# Patient Record
Sex: Female | Born: 1945 | Race: White | Hispanic: No | Smoking: Former smoker
Health system: Southern US, Community
[De-identification: ages and names within clinical notes are randomized; demographics above are authoritative.]

## PROBLEM LIST (undated history)

## (undated) DIAGNOSIS — M545 Low back pain, unspecified: Secondary | ICD-10-CM

## (undated) DIAGNOSIS — E7800 Pure hypercholesterolemia, unspecified: Secondary | ICD-10-CM

## (undated) DIAGNOSIS — F419 Anxiety disorder, unspecified: Secondary | ICD-10-CM

## (undated) DIAGNOSIS — I10 Essential (primary) hypertension: Secondary | ICD-10-CM

## (undated) DIAGNOSIS — J45909 Unspecified asthma, uncomplicated: Secondary | ICD-10-CM

## (undated) HISTORY — PX: CHOLECYSTECTOMY: SHX55

## (undated) HISTORY — DX: Pure hypercholesterolemia, unspecified: E78.00

## (undated) HISTORY — DX: Low back pain: M54.5

## (undated) HISTORY — DX: Essential (primary) hypertension: I10

## (undated) HISTORY — DX: Anxiety disorder, unspecified: F41.9

## (undated) HISTORY — DX: Low back pain, unspecified: M54.50

## (undated) HISTORY — DX: Unspecified asthma, uncomplicated: J45.909

## (undated) HISTORY — PX: OTHER SURGICAL HISTORY: SHX169

---

## 1998-03-20 ENCOUNTER — Ambulatory Visit (HOSPITAL_COMMUNITY): Admission: RE | Admit: 1998-03-20 | Discharge: 1998-03-20 | Payer: Self-pay | Admitting: Orthopedic Surgery

## 1998-03-23 ENCOUNTER — Ambulatory Visit (HOSPITAL_COMMUNITY): Admission: RE | Admit: 1998-03-23 | Discharge: 1998-03-23 | Payer: Self-pay | Admitting: Orthopedic Surgery

## 1998-05-01 ENCOUNTER — Ambulatory Visit (HOSPITAL_COMMUNITY): Admission: RE | Admit: 1998-05-01 | Discharge: 1998-05-01 | Payer: Self-pay | Admitting: Obstetrics and Gynecology

## 1998-07-13 ENCOUNTER — Other Ambulatory Visit: Admission: RE | Admit: 1998-07-13 | Discharge: 1998-07-13 | Payer: Self-pay | Admitting: *Deleted

## 1998-12-24 ENCOUNTER — Other Ambulatory Visit: Admission: RE | Admit: 1998-12-24 | Discharge: 1998-12-24 | Payer: Self-pay | Admitting: Obstetrics and Gynecology

## 1999-05-21 ENCOUNTER — Ambulatory Visit (HOSPITAL_COMMUNITY): Admission: RE | Admit: 1999-05-21 | Discharge: 1999-05-21 | Payer: Self-pay | Admitting: Urology

## 1999-05-21 ENCOUNTER — Encounter: Payer: Self-pay | Admitting: Urology

## 1999-07-10 ENCOUNTER — Other Ambulatory Visit: Admission: RE | Admit: 1999-07-10 | Discharge: 1999-07-10 | Payer: Self-pay | Admitting: Obstetrics and Gynecology

## 1999-07-11 ENCOUNTER — Other Ambulatory Visit: Admission: RE | Admit: 1999-07-11 | Discharge: 1999-07-11 | Payer: Self-pay | Admitting: *Deleted

## 1999-07-11 ENCOUNTER — Encounter (INDEPENDENT_AMBULATORY_CARE_PROVIDER_SITE_OTHER): Payer: Self-pay

## 2000-05-14 ENCOUNTER — Other Ambulatory Visit: Admission: RE | Admit: 2000-05-14 | Discharge: 2000-05-14 | Payer: Self-pay | Admitting: Obstetrics and Gynecology

## 2000-10-01 ENCOUNTER — Emergency Department (HOSPITAL_COMMUNITY): Admission: EM | Admit: 2000-10-01 | Discharge: 2000-10-01 | Payer: Self-pay | Admitting: Emergency Medicine

## 2001-05-04 ENCOUNTER — Other Ambulatory Visit: Admission: RE | Admit: 2001-05-04 | Discharge: 2001-05-04 | Payer: Self-pay | Admitting: Obstetrics and Gynecology

## 2001-05-12 ENCOUNTER — Encounter: Payer: Self-pay | Admitting: Orthopedic Surgery

## 2001-05-12 ENCOUNTER — Ambulatory Visit (HOSPITAL_COMMUNITY): Admission: RE | Admit: 2001-05-12 | Discharge: 2001-05-12 | Payer: Self-pay | Admitting: Orthopedic Surgery

## 2001-10-08 ENCOUNTER — Encounter: Payer: Self-pay | Admitting: General Surgery

## 2001-10-08 ENCOUNTER — Ambulatory Visit (HOSPITAL_COMMUNITY): Admission: RE | Admit: 2001-10-08 | Discharge: 2001-10-08 | Payer: Self-pay | Admitting: General Surgery

## 2002-04-13 ENCOUNTER — Ambulatory Visit (HOSPITAL_COMMUNITY): Admission: RE | Admit: 2002-04-13 | Discharge: 2002-04-13 | Payer: Self-pay | Admitting: Orthopedic Surgery

## 2002-04-13 ENCOUNTER — Encounter: Payer: Self-pay | Admitting: Orthopedic Surgery

## 2002-08-11 ENCOUNTER — Other Ambulatory Visit: Admission: RE | Admit: 2002-08-11 | Discharge: 2002-08-11 | Payer: Self-pay | Admitting: Obstetrics and Gynecology

## 2003-03-02 ENCOUNTER — Other Ambulatory Visit: Admission: RE | Admit: 2003-03-02 | Discharge: 2003-03-02 | Payer: Self-pay | Admitting: Obstetrics and Gynecology

## 2003-04-23 ENCOUNTER — Emergency Department (HOSPITAL_COMMUNITY): Admission: EM | Admit: 2003-04-23 | Discharge: 2003-04-23 | Payer: Self-pay | Admitting: Emergency Medicine

## 2003-04-23 ENCOUNTER — Encounter: Payer: Self-pay | Admitting: Emergency Medicine

## 2003-12-27 ENCOUNTER — Emergency Department (HOSPITAL_COMMUNITY): Admission: EM | Admit: 2003-12-27 | Discharge: 2003-12-27 | Payer: Self-pay | Admitting: Emergency Medicine

## 2004-12-02 ENCOUNTER — Ambulatory Visit: Payer: Self-pay | Admitting: Internal Medicine

## 2004-12-23 ENCOUNTER — Emergency Department (HOSPITAL_COMMUNITY): Admission: EM | Admit: 2004-12-23 | Discharge: 2004-12-23 | Payer: Self-pay | Admitting: Emergency Medicine

## 2005-08-06 ENCOUNTER — Ambulatory Visit: Payer: Self-pay | Admitting: Pulmonary Disease

## 2006-06-03 ENCOUNTER — Ambulatory Visit: Payer: Self-pay | Admitting: Critical Care Medicine

## 2006-06-11 ENCOUNTER — Ambulatory Visit: Payer: Self-pay | Admitting: Pulmonary Disease

## 2006-09-08 ENCOUNTER — Ambulatory Visit: Payer: Self-pay | Admitting: Pulmonary Disease

## 2006-11-17 ENCOUNTER — Ambulatory Visit: Payer: Self-pay | Admitting: Pulmonary Disease

## 2006-12-11 ENCOUNTER — Encounter: Payer: Self-pay | Admitting: Pulmonary Disease

## 2006-12-11 LAB — CONVERTED CEMR LAB
AST: 34 units/L (ref 0–37)
Bilirubin, Direct: 0.1 mg/dL (ref 0.0–0.3)
CO2: 23 meq/L (ref 19–32)
Calcium: 9 mg/dL (ref 8.4–10.5)
Eosinophils Relative: 4 % (ref 0–5)
Glucose, Bld: 42 mg/dL — ABNORMAL LOW (ref 70–99)
HCT: 44.7 % (ref 36.0–46.0)
Hemoglobin: 14.7 g/dL (ref 12.0–15.0)
LDL Cholesterol: 155 mg/dL — ABNORMAL HIGH (ref 0–99)
Lymphocytes Relative: 21 % (ref 12–46)
Lymphs Abs: 1.1 10*3/uL (ref 0.7–3.3)
Platelets: 256 10*3/uL (ref 150–400)
Sodium: 141 meq/L (ref 135–145)
TSH: 2.824 microintl units/mL (ref 0.350–5.50)
Total Bilirubin: 0.6 mg/dL (ref 0.3–1.2)
Total CHOL/HDL Ratio: 4
VLDL: 25 mg/dL (ref 0–40)
WBC: 5.6 10*3/uL (ref 4.0–10.5)

## 2006-12-16 ENCOUNTER — Ambulatory Visit: Payer: Self-pay | Admitting: Pulmonary Disease

## 2008-02-08 DIAGNOSIS — I10 Essential (primary) hypertension: Secondary | ICD-10-CM | POA: Insufficient documentation

## 2008-02-08 DIAGNOSIS — F411 Generalized anxiety disorder: Secondary | ICD-10-CM | POA: Insufficient documentation

## 2008-03-28 ENCOUNTER — Ambulatory Visit: Payer: Self-pay | Admitting: Pulmonary Disease

## 2008-03-28 DIAGNOSIS — M545 Low back pain, unspecified: Secondary | ICD-10-CM | POA: Insufficient documentation

## 2008-03-28 DIAGNOSIS — Z91199 Patient's noncompliance with other medical treatment and regimen due to unspecified reason: Secondary | ICD-10-CM | POA: Insufficient documentation

## 2008-03-28 DIAGNOSIS — E7800 Pure hypercholesterolemia, unspecified: Secondary | ICD-10-CM | POA: Insufficient documentation

## 2008-03-28 DIAGNOSIS — Z9119 Patient's noncompliance with other medical treatment and regimen: Secondary | ICD-10-CM

## 2008-04-05 LAB — CONVERTED CEMR LAB
ALT: 30 units/L (ref 0–35)
AST: 24 units/L (ref 0–37)
Basophils Absolute: 0 10*3/uL (ref 0.0–0.1)
Bilirubin, Direct: 0.2 mg/dL (ref 0.0–0.3)
CO2: 30 meq/L (ref 19–32)
Calcium: 9.5 mg/dL (ref 8.4–10.5)
Chloride: 98 meq/L (ref 96–112)
Cholesterol: 252 mg/dL (ref 0–200)
GFR calc non Af Amer: 109 mL/min
HDL: 57 mg/dL (ref >39.0)
Lymphocytes Relative: 18.8 % (ref 12.0–46.0)
MCHC: 34.7 g/dL (ref 30.0–36.0)
Neutrophils Relative %: 69.4 % (ref 43.0–77.0)
RDW: 13.8 % (ref 11.5–14.6)
Sodium: 135 meq/L (ref 135–145)
TSH: 2.65 microintl units/mL (ref 0.35–5.50)
Total Bilirubin: 0.9 mg/dL (ref 0.3–1.2)
Triglycerides: 96 mg/dL (ref 0–149)
VLDL: 19 mg/dL (ref 0–40)

## 2008-09-25 ENCOUNTER — Ambulatory Visit: Payer: Self-pay | Admitting: Pulmonary Disease

## 2008-10-01 LAB — CONVERTED CEMR LAB
Albumin: 4 g/dL (ref 3.5–5.2)
Alkaline Phosphatase: 95 units/L (ref 39–117)
BUN: 8 mg/dL (ref 6–23)
Calcium: 9.4 mg/dL (ref 8.4–10.5)
Direct LDL: 150.2 mg/dL
GFR calc Af Amer: 162 mL/min
GFR calc non Af Amer: 134 mL/min
HDL: 50.4 mg/dL (ref >39.0)
Potassium: 3.7 meq/L (ref 3.5–5.1)
Total Protein: 7 g/dL (ref 6.0–8.3)
Triglycerides: 72 mg/dL (ref 0–149)
VLDL: 14 mg/dL (ref 0–40)

## 2008-10-05 ENCOUNTER — Ambulatory Visit: Payer: Self-pay | Admitting: Internal Medicine

## 2008-10-18 ENCOUNTER — Ambulatory Visit: Payer: Self-pay | Admitting: Internal Medicine

## 2009-10-10 ENCOUNTER — Telehealth: Payer: Self-pay | Admitting: Pulmonary Disease

## 2009-12-10 ENCOUNTER — Telehealth (INDEPENDENT_AMBULATORY_CARE_PROVIDER_SITE_OTHER): Payer: Self-pay | Admitting: *Deleted

## 2009-12-26 ENCOUNTER — Ambulatory Visit: Payer: Self-pay | Admitting: Pulmonary Disease

## 2009-12-30 LAB — CONVERTED CEMR LAB
AST: 28 units/L (ref 0–37)
Alkaline Phosphatase: 101 units/L (ref 39–117)
Basophils Absolute: 0.1 10*3/uL (ref 0.0–0.1)
Bilirubin, Direct: 0.2 mg/dL (ref 0.0–0.3)
Calcium: 9.6 mg/dL (ref 8.4–10.5)
Cholesterol: 234 mg/dL — ABNORMAL HIGH (ref 0–200)
GFR calc non Af Amer: 107.79 mL/min (ref >60)
Glucose, Bld: 86 mg/dL (ref 70–99)
HCT: 39.8 % (ref 36.0–46.0)
Hemoglobin, Urine: NEGATIVE
Hemoglobin: 13.8 g/dL (ref 12.0–15.0)
Lymphs Abs: 1.2 10*3/uL (ref 0.7–4.0)
MCHC: 34.7 g/dL (ref 30.0–36.0)
MCV: 89.2 fL (ref 78.0–100.0)
Monocytes Absolute: 0.6 10*3/uL (ref 0.1–1.0)
Monocytes Relative: 8.6 % (ref 3.0–12.0)
Neutro Abs: 4.8 10*3/uL (ref 1.4–7.7)
Nitrite: NEGATIVE
Platelets: 264 10*3/uL (ref 150.0–400.0)
Potassium: 4 meq/L (ref 3.5–5.1)
RDW: 14.2 % (ref 11.5–14.6)
Sodium: 131 meq/L — ABNORMAL LOW (ref 135–145)
Specific Gravity, Urine: 1.015 (ref 1.000–1.030)
Total Bilirubin: 1 mg/dL (ref 0.3–1.2)
Total CHOL/HDL Ratio: 3
Total Protein, Urine: NEGATIVE mg/dL
Urine Glucose: NEGATIVE mg/dL
Urobilinogen, UA: 0.2 (ref 0.0–1.0)
VLDL: 14.8 mg/dL (ref 0.0–40.0)
Vit D, 25-Hydroxy: 26 ng/mL — ABNORMAL LOW (ref 30–89)

## 2010-01-10 ENCOUNTER — Telehealth: Payer: Self-pay | Admitting: Pulmonary Disease

## 2010-03-21 ENCOUNTER — Encounter: Payer: Self-pay | Admitting: Pulmonary Disease

## 2010-09-24 NOTE — Progress Notes (Signed)
Summary: PHYSICAL/ LABS  Phone Note Call from Patient   Caller: Patient Call For: NADEL Summary of Call: PT WANTS TO SCHEDUL A PHYSICAL. HOWEVER, SHE NEEDS AN APPT "LATE IN THE DAY" AS SHE CAN'T GET OFF UNTIL 4:30. 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 [phone] Initial call taken by: Tivis Ringer, CNA,  December 10, 2009 9:21 AM  Follow-up for Phone Call        Pt states it is time for yearly cpx with SN.  Requesting an appt for 3pm or after.  Also would like to know if she can have labs done the morning of appt at [facility] Short Stay ( she works there ).  If so, would like these orders faxed to [phone].  Will forward to SN - pls advise.  Thanks!  Follow-up by: Gweneth Dimitri RN,  December 10, 2009 9:32 AM  Additional Follow-up for Phone Call Additional follow up Details #1::        per SN---please make appt for pt on 5-4 at 3:30 pm.  thanks Randell Loop CMA  December 10, 2009 11:00 AM    she can do that when she comes in for her labs---we can give her the rx for the labs to have done at work.  thanks Randell Loop CMA  December 10, 2009 2:17 PM     Additional Follow-up for Phone Call Additional follow up Details #2::    Did SN want to order the labs? Please advise thanks Vernie Murders  December 10, 2009 12:51 PM  Spoke with pt and sched appt with SN for CPX for 12/26/09 at 3:30 pm.  Pt aware we can just order the labs at that time. Follow-up by: Vernie Murders,  December 10, 2009 2:24 PM

## 2010-09-24 NOTE — Progress Notes (Signed)
Summary: Change Micardis HCT to Losartan HCT  Phone Note Outgoing Call   Call placed by: Michel Bickers CMA,  October 10, 2009 11:20 AM Call placed to: Patient Summary of Call: [facility] stating that the patient would have an annual savings of $100 is she switched from Micardis-HCT to Losartan HCT. The patient states she is fine with making the switch if SN okays this. Please advise. Initial call taken by: Michel Bickers CMA,  October 10, 2009 11:22 AM  Follow-up for Phone Call        per SN---ok to change to losartan/hct  100/12.5mg    #30  1 by mouth once daily refill x 1   she is due for yearly follow up now and will need to schedule this. Randell Loop [facility]  October 10, 2009 4:18 PM   rx sent. pt home number is disconnected. ATC pt at work number but she had already left for the day. will retry tomorrow. Carron Curie CMA  October 10, 2009 4:32 PM  pt advised rx sent. pt did not want to give another home number at this time. Carron Curie CMA  October 11, 2009 2:24 PM     New/Updated Medications: LOSARTAN POTASSIUM-HCTZ 100-12.5 MG TABS (LOSARTAN POTASSIUM-HCTZ) Take 1 tablet by mouth once a day Prescriptions: LOSARTAN POTASSIUM-HCTZ 100-12.5 MG TABS (LOSARTAN POTASSIUM-HCTZ) Take 1 tablet by mouth once a day  #30 x 1   Entered by:   Carron Curie CMA   Authorized by:   Michele Mcalpine MD   Signed by:   Carron Curie CMA on 10/10/2009   Method used:   [facility]ronically [facility]        [facility]  [phone]* (retail)       [address] [place], [place]  [address]       Ph: [phone] or [phone]       Fax: [phone]   RxID:   [id number]

## 2010-09-24 NOTE — Letter (Signed)
Summary: [facility]   Imported By: Sherian Rein 04/08/2010 14:15:05  _____________________________________________________________________  External Attachment:    Type:   Image     Comment:   External Document

## 2010-09-24 NOTE — Progress Notes (Signed)
Summary: rx  Phone Note Call from Patient Call back at Home Phone [phone]   Caller: Patient Call For: Lisa Milian Reason for Call: Talk to Nurse Summary of Call: pt lost her rx's - can you [facility] Losartan Potassium, Proair HFA, Advair Diskus 250/50 Initial call taken by: Eugene Gavia,  Jan 10, 2010 8:08 AM  Follow-up for Phone Call        rx sent. ATC pt but number disconnected. Carron Curie CMA  Jan 10, 2010 9:27 AM     Prescriptions: LOSARTAN POTASSIUM-HCTZ 100-12.5 MG TABS (LOSARTAN POTASSIUM-HCTZ) Take 1 tablet by mouth once a day  #30 x prn   Entered by:   Carron Curie CMA   Authorized by:   Michele Mcalpine MD   Signed by:   Carron Curie CMA on 01/10/2010   Method used:   [facility]ically to        [facility]  [phone]* (retail)       [address] [place], [place]  [address]       Ph: [phone] or [phone]       Fax: [phone]   RxID:   [id number] PROAIR HFA 108 (90 BASE) MCG/ACT  AERS (ALBUTEROL SULFATE) 1-2 sprays every 6H as needed for wheezing...  #1 x prn   Entered by:   Carron Curie CMA   Authorized by:   Michele Mcalpine MD   Signed by:   Carron Curie CMA on 01/10/2010   Method used:   [facility]ically to        [facility]  [phone]* (retail)       [address] [place], [place]  [address]       Ph: [phone] or [phone]       Fax: [phone]   RxID:   [id number] ADVAIR DISKUS 250-50 MCG/DOSE  MISC (FLUTICASONE-SALMETEROL) 1 inhalation twice daily...  #1 x prn   Entered by:   Carron Curie CMA   Authorized by:   Michele Mcalpine MD   Signed by:   Carron Curie CMA on 01/10/2010   Method used:   [facility]        [facility]  [phone]* (retail)       [address] [place], [place]  [address]       Ph: [phone] or [phone]       Fax: [phone]   RxID:   [id number]

## 2010-09-24 NOTE — Assessment & Plan Note (Signed)
Summary: cpx//lmr   CC:  15 month ROV & CPX....  History of Present Illness: 65 y/o WF here for a follow up visit & review of her mult med problems...   ~  September 25, 2008:  when last seen Aug09 her FLP was even worse w/ TChol 252, & LDL 180... she agreed to start Simva40 and has been regular w/ her dosing & tol well... here today for f/u FLP... she also has Asthma & has been good about taking her Advair250Bid although she still uses her Proair fairly frequently (several times per week)... NOTE: SHE TELLS ME THAT SHE IS WILLING TO SCHEDULE A COLONOSCOPY!!!   ~  Dec 26, 2009:  she states she had a good yr & feels great... no problems w/ her Asthma at all, she says... she had her colonoscopy by DrDBrodie 2/10 & it was neg- f/u planned 35yrs... she has not been taking her Simva40 ("I forget it half the time") & she's been "doing it on my own w/ really good diet" despite 4# weight gain... she agrees to try Crestor in the AM w/ her other meds if she needs statin therapy... she notes that BP has been great at work- all 130-140/ 70-80 she says;  but todays BP is elevated w/ sl HA & she is quite sure this is a one time aberration since ALL her prev readings were so normal at work... we discussed no salt, get weight down, take med every day, & check BP's often at home, at work, etc (she will call if BP> 150/90).   Current Problem List:  Hx of ASTHMA, MODERATE (ICD-493.90) - she is an ex-smoker, quit 2002... hx mod asthma w/ occas exac & mult triggers... ? of VCD in the past... she has been taking ADVAIR 250Bid and reports no asthma attacks in several yrs, and she uses the [facility] rescue inhaler as needed... she had trip to [place] and went to [place] in the summer of 2009 without too much difficulty... denies cough, sputum, hemoptysis, worsening dyspnea, wheezing, chest pains, snoring, daytime hypersomnolence, etc...   ~  CXR 5/11 showed chr changes/ COPD, NAD...  HYPERTENSION (ICD-401.9) - hx mod  HBP that appears to be well controlled on LOSARTAN/ HCT 100-12.5 daily... BP's at work all 130-140's/ 70-80's and takes med regularly & tolerates well... BP today= 160/100+ and it was elevated at work too, but she is quite sure this is a one time aberration related to ???... she has sl HA & fatigue; but denies visual changes, CP, palipit, dizziness, syncope, dyspnea, edema, etc...   ~  EKG 5/11 showed NSR, WNL.Marland Kitchen.  ~  5/11:  we discussed the importance of no salt, get weight down, take med every day, & check BP's often at home, at work, etc (she will call if BP> 150/90).  HYPERCHOLESTEROLEMIA (ICD-272.0) - she tried  to control Chol w/ oatmeal, cheerios, and fish oil... finally agreed to start SIMVASTATIN 40mg /d in Aug09, but couldn't remember to take it half the time since it was at night... she has agreed to try CRESTOR Qam>   ~  FLP 4/08 showed TChol 241, TG 126, HDL 61, LDL 155... rec to start Simvastatin, but chose diet Rx.  ~  FLP 8/09 showed TChol 252, TG 96, HDL 57, LDL 180... she agreed to start Simva40.  ~  FLP 1/10 showed TChol 213, TG 72, HDL 50, LDL 150... rec to take med more regularly.  ~  FLP 5/11 showed TChol  234, TG 74, HDL 68, LDL 156... admits to not taking Simva40Qpm, ch to CRESTOR10Qam.  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 eval from DrGioffre and Alusio in the past...  ANXIETY (ICD-300.00)   Allergies: 1)  ! Codeine  Comments:  Nurse/Medical Assistant: The patient's medications and allergies were reviewed with the patient and were updated in the Medication and Allergy Lists.  Past History:  Past Medical History: Hx of ASTHMA, MODERATE (ICD-493.90) HYPERTENSION (ICD-401.9) HYPERCHOLESTEROLEMIA (ICD-272.0) Hx of BACK PAIN, LUMBAR (ICD-724.2) ANXIETY (ICD-300.00)  Past Surgical History: S/P T & A S/P cholecystectomy  Family History: Reviewed history from 03/28/2008 and no changes required. mother deceased age and reason unknown father  deceased from heart attack--age unknown 1 sibling age 110  Social History: Reviewed history from 09/25/2008 and no changes required. widowed 1 child quit smoking in 2004 no exercise drinks caffeine--2 cups per day etoh--social  Review of Systems  The patient denies fever, chills, sweats, anorexia, fatigue, weakness, malaise, weight loss, sleep disorder, blurring, diplopia, [name] irritation, [name] discharge, vision loss, [name] pain, photophobia, earache, ear discharge, tinnitus, decreased hearing, nasal congestion, nosebleeds, sore throat, hoarseness, chest pain, palpitations, syncope, dyspnea on exertion, orthopnea, PND, peripheral edema, cough, dyspnea at rest, excessive sputum, hemoptysis, wheezing, pleurisy, nausea, vomiting, diarrhea, constipation, change in bowel habits, abdominal pain, melena, hematochezia, jaundice, gas/bloating, indigestion/heartburn, dysphagia, odynophagia, dysuria, hematuria, urinary frequency, urinary hesitancy, nocturia, incontinence, back pain, joint pain, joint swelling, muscle cramps, muscle weakness, stiffness, arthritis, sciatica, restless legs, leg pain at night, leg pain with exertion, rash, itching, dryness, suspicious lesions, paralysis, paresthesias, seizures, tremors, vertigo, transient blindness, frequent falls, frequent headaches, difficulty walking, depression, anxiety, memory loss, confusion, cold intolerance, heat intolerance, polydipsia, polyphagia, polyuria, unusual weight change, abnormal bruising, bleeding, enlarged lymph nodes, urticaria, allergic rash, hay fever, and recurrent infections.    Vital Signs:  Patient profile:   65 year old female Height:      62 inches Weight:      162.31 pounds BMI:     29.79 O2 Sat:      96 % on Room air Temp:     97.1 degrees F oral Pulse rate:   97 / minute BP sitting:   160 / 100  (left arm) Cuff size:   regular  Vitals Entered By: Randell Loop CMA (Dec 26, 2009 3:34 PM)  O2 Sat at Rest %:  96 O2 Flow:  Room  air CC: 15 month ROV & CPX... Is Patient Diabetic? No Pain Assessment Patient in pain? no      Comments meds updated today   Physical Exam  Additional Exam:  WD, WN, 65 y/o WF in NAD... GENERAL:  Alert & oriented; pleasant & cooperative... HEENT:  [place]/AT, EOM-wnl, PERRLA, EACs-clear, TMs-wnl, NOSE-clear, THROAT-clear & wnl. NECK:  Supple w/ full ROM; no JVD; normal carotid impulses w/o bruits; no thyromegaly or nodules palpated; no lymphadenopathy. CHEST:  Clear to P & A; without wheezes/ rales/ or rhonchi heard... HEART:  Regular Rhythm; without murmurs/ rubs/ or gallops detected... ABDOMEN:  Soft & nontender; normal bowel sounds; no organomegaly or masses palpated... EXT: without deformities or arthritic changes; no varicose veins/ venous insuffic/ or edema. NEURO:  CN's intact; motor testing normal; sensory testing normal; gait normal & balance OK. DERM:  No lesions noted; no rash etc...     CXR  Procedure date:  12/26/2009  Findings:      CHEST - 2 VIEW Comparison: 06/03/2006   Findings: The cardiac silhouette,  mediastinal and hilar contours are within normal limits and stable.  There are chronic lung changes/COPD but no definite acute overlying pulmonary process. The bony thorax is intact.  No pleural effusion.   IMPRESSION: Chronic lung changes/COPD but no acute overlying pulmonary process.   Read By:  Cyndie Chime,  M.D.   EKG  Procedure date:  12/26/2009  Findings:      Normal sinus rhythm with rate of:  80/ min... Tracing is WNL...  SN   MISC. Report  Procedure date:  12/26/2009  Findings:      BMP (METABOL)   Sodium               [L]  131 mEq/L                   135-145   Potassium                 4.0 mEq/L                   3.5-5.1   Chloride             [L]  94 mEq/L                    96-112   Carbon Dioxide            29 mEq/L                    19-32   Glucose                   86 mg/dL                    16-10   BUN                        9 mg/dL                     9-60   Creatinine                0.6 mg/dL                   4.5-4.0   Calcium                   9.6 mg/dL                   9.8-11.9   GFR                       107.79 mL/min               >60  Hepatic/Liver Function Panel (HEPATIC)   Total Bilirubin           1.0 mg/dL                   1.4-7.8   Direct Bilirubin          0.2 mg/dL                   2.9-5.6   Alkaline Phosphatase      101 U/L                     39-117   AST  28 U/L                      0-37   ALT                       31 U/L                      0-35   Total Protein             7.3 g/dL                    1.6-1.0   Albumin                   4.4 g/dL                    9.6-0.4  CBC Platelet w/Diff (CBCD)   White Cell Count          6.7 K/uL                    4.5-10.5   Red Cell Count            4.47 Mil/uL                 3.87-5.11   Hemoglobin                13.8 g/dL                   54.0-98.1   Hematocrit                39.8 %                      36.0-46.0   MCV                       89.2 fl                     78.0-100.0   Platelet Count            264.0 K/uL                  150.0-400.0   Neutrophil %              71.4 %                      43.0-77.0   Lymphocyte %              17.4 %                      12.0-46.0   Monocyte %                8.6 %                       3.0-12.0   Eosinophils%              1.8 %                       0.0-5.0   Basophils %               0.8 %  0.0-3.0  Comments:      Lipid Panel (LIPID)   Cholesterol          [H]  234 mg/dL                   2-130   Triglycerides             74.0 mg/dL                  8.6-578.4   HDL                       69.62 mg/dL                 >95.28   Cholesterol LDL - Direct (DIRLDL)  Cholesterol LDL - Direct                             155.7 mg/dL           TSH (TSH)   FastTSH                   2.42 uIU/mL                 0.35-5.50  UDip w/Micro (URINE)   Color                      LT. YELLOW   Clarity                   CLEAR                       Clear   Specific Gravity          1.015                       1.000 - 1.030   Urine Ph                  6.5                         5.0-8.0   Protein                   NEGATIVE                    Negative   Urine Glucose             NEGATIVE                    Negative   Ketones                   NEGATIVE                    Negative   Urine Bilirubin           NEGATIVE                    Negative   Blood                     NEGATIVE                    Negative   Urobilinogen              0.2  0.0 - 1.0   Leukocyte Esterace        TRACE                       Negative   Nitrite                   NEGATIVE                    Negative   Urine WBC                 0-2/hpf                     0-2/hpf   Urine Epith               Rare(0-4/hpf)               Rare(0-4/hpf)  Vitamin D (25-Hydroxy) (78295)  Vitamin D (25-Hydroxy)                        [L]  26 ng/mL                    30-89   Impression & Recommendations:  Problem # 1:  PHYSICAL EXAMINATION (ICD-V70.0)  Orders: EKG w/ Interpretation (93000) T-2 View CXR (71020TC) TLB-BMP (Basic Metabolic Panel-BMET) (80048-METABOL) TLB-Hepatic/Liver Function Pnl (80076-HEPATIC) TLB-CBC Platelet - w/Differential (85025-CBCD) TLB-Lipid Panel (80061-LIPID) TLB-TSH (Thyroid Stimulating Hormone) (84443-TSH) TLB-Udip w/ Micro (81001-URINE) T-Vitamin D (25-Hydroxy) (62130-86578)  Problem # 2:  Hx of ASTHMA, MODERATE (ICD-493.90) Stable on the Advair & Prn Proair (only using this <1 per week)... Her updated medication list for this problem includes:    Advair Diskus 250-50 Mcg/dose Misc (Fluticasone-salmeterol) .Marland Kitchen... 1 inhalation twice daily...    Proair Hfa 108 (90 Base) Mcg/act Aers (Albuterol sulfate) .Marland Kitchen... 1-2 sprays every 6h as needed for wheezing...  Problem # 3:  HYPERTENSION (ICD-401.9) BP is up today but she is adamant that it has  been well controlled in the 130-140/ 70-80 range at work over the last yr & she checks it freq= 2-3 times weekly... We decided not to incr meds at this point, but I stressed that it is imperitive that she keep the salt out of her diet, gets weight down, takes med every day, and continues to monitor BP regularly at home & at work... she is to notify us if the BP is sustained >150/90 for additional meds... Her updated medication list for this problem includes:    Losartan Potassium-hctz 100-12.5 Mg Tabs (Losartan potassium-hctz) .Marland Kitchen... Take 1 tablet by mouth once a day  Problem # 4:  HYPERCHOLESTEROLEMIA (ICD-272.0) She was unable to take the Simva40 regularly at night, missing it >50% of the time so she stopped... she tells me that she has been controlling it on her own now w/ diet (despite 4# weight gain)... she wants to f/u FLP_ at this point but did agree to consider Crestor Qam if the labs aren't improved... REC> start CRESTOR 10 Qam. The following medications were removed from the medication list:    Simvastatin 40 Mg Tabs (Simvastatin) .Marland Kitchen... Take one tablet by mouth at bedtime  Problem # 5:  OTHER MEDICAL PROBLEMS AS NOTED>>>  Complete Medication List: 1)  Advair Diskus 250-50 Mcg/dose Misc (Fluticasone-salmeterol) .Marland Kitchen.. 1 inhalation twice daily.Marland KitchenMarland Kitchen 2)  Proair Hfa 108 (90 Base) Mcg/act Aers (Albuterol sulfate) .Marland Kitchen.. 1-2 sprays every 6h as needed for wheezing.Marland KitchenMarland Kitchen 3)  Losartan Potassium-hctz 100-12.5 Mg Tabs (Losartan potassium-hctz) .... Take 1 tablet by mouth once a day  Patient Instructions: 1)  Today we updated your med list- see below.... 2)  We refilled your meds for 2011... 3)  Today we did your follow up CXR, EKG, & FASTING blood work... please call the "phone tree" in a few days for your lab results.Marland KitchenMarland Kitchen 4)  If your Cholesterol is back up we will recommend CRESTOR to take in the AM w/ your other pills (so you won't forget).Marland KitchenMarland Kitchen 5)  Please call the "phone tree" in a few days for your lab  results.Marland KitchenMarland Kitchen  6)  For your BP: please continue to monitor it carefully at work or w/ a digital home BP cuff & call me if the BP is >150/90.Marland KitchenMarland Kitchen 7)  Continue your diet + exercise ptrogram... 8)  Call for any questions.Marland KitchenMarland Kitchen 9)  Please schedule a follow-up appointment in 1 year, sooner as needed. Prescriptions: LOSARTAN POTASSIUM-HCTZ 100-12.5 MG TABS (LOSARTAN POTASSIUM-HCTZ) Take 1 tablet by mouth once a day  #30 x prn   Entered and Authorized by:   Michele Mcalpine MD   Signed by:   Michele Mcalpine MD on 12/26/2009   Method used:   Print then Give to Patient   RxID:   [id number] PROAIR HFA 108 (90 BASE) MCG/ACT  AERS (ALBUTEROL SULFATE) 1-2 sprays every 6H as needed for wheezing...  #1 x prn   Entered and Authorized by:   Michele Mcalpine MD   Signed by:   Michele Mcalpine MD on 12/26/2009   Method used:   Print then Give to Patient   RxID:   [id number] ADVAIR DISKUS 250-50 MCG/DOSE  MISC (FLUTICASONE-SALMETEROL) 1 inhalation twice daily...  #1 x prn   Entered and Authorized by:   Michele Mcalpine MD   Signed by:   Michele Mcalpine MD on 12/26/2009   Method used:   Print then Give to Patient   RxID:   [id number]    [facility] ECG  ID: [id number] Patient: [name], [name] DOB: 1945/12/15 Age: 65 Years Old Sex: Female Race: White Physician: Dr Kriste Basque Technician: Denna Haggard, CMA Height: 62 Weight: 162.31 Status: Unconfirmed Past Medical History:   Hx of ASTHMA, MODERATE (ICD-493.90) HYPERTENSION (ICD-401.9) HYPERCHOLESTEROLEMIA (ICD-272.0) Hx of BACK PAIN, LUMBAR (ICD-724.2) ANXIETY (ICD-300.00) PERS HX NONCOMPLIANCE W/MED TX PRS HAZARDS HLTH (ICD-V15.81)   Recorded: 12/26/2009 3:57 PM P/PR: 87 ms / 118 ms - Heart rate (maximum exercise) QRS: 82 QT/QTc/QTd: 400 ms / 433 ms / 31 ms - Heart rate (maximum exercise)  P/QRS/T axis: 59 deg / 71 deg / 73 deg - Heart rate (maximum exercise)  Heartrate: 79 bpm  Interpretation:  Normal sinus rhythm with rate of:  80/  min... Tracing is WNL.Marland KitchenMarland Kitchen  SN

## 2010-11-24 HISTORY — PX: OTHER SURGICAL HISTORY: SHX169

## 2010-12-11 ENCOUNTER — Other Ambulatory Visit: Payer: Self-pay | Admitting: Orthopedic Surgery

## 2010-12-11 ENCOUNTER — Other Ambulatory Visit (HOSPITAL_COMMUNITY): Payer: 59

## 2010-12-11 ENCOUNTER — Encounter (HOSPITAL_COMMUNITY): Payer: 59 | Attending: Orthopedic Surgery

## 2010-12-11 DIAGNOSIS — J449 Chronic obstructive pulmonary disease, unspecified: Secondary | ICD-10-CM | POA: Insufficient documentation

## 2010-12-11 DIAGNOSIS — I10 Essential (primary) hypertension: Secondary | ICD-10-CM | POA: Insufficient documentation

## 2010-12-11 DIAGNOSIS — J4489 Other specified chronic obstructive pulmonary disease: Secondary | ICD-10-CM | POA: Insufficient documentation

## 2010-12-11 DIAGNOSIS — Z01812 Encounter for preprocedural laboratory examination: Secondary | ICD-10-CM | POA: Insufficient documentation

## 2010-12-11 DIAGNOSIS — M19049 Primary osteoarthritis, unspecified hand: Secondary | ICD-10-CM | POA: Insufficient documentation

## 2010-12-11 LAB — CBC
MCH: 29.7 pg (ref 26.0–34.0)
MCHC: 33.7 g/dL (ref 30.0–36.0)
Platelets: 264 10*3/uL (ref 150–400)
RDW: 13.2 % (ref 11.5–15.5)

## 2010-12-11 LAB — COMPREHENSIVE METABOLIC PANEL
AST: 33 U/L (ref 0–37)
Albumin: 3.9 g/dL (ref 3.5–5.2)
Calcium: 9.3 mg/dL (ref 8.4–10.5)
Creatinine, Ser: 0.52 mg/dL (ref 0.4–1.2)
GFR calc Af Amer: >60 mL/min (ref >60)
GFR calc non Af Amer: >60 mL/min (ref >60)
Sodium: 134 mEq/L — ABNORMAL LOW (ref 135–145)
Total Protein: 7.1 g/dL (ref 6.0–8.3)

## 2010-12-11 LAB — SURGICAL PCR SCREEN
MRSA, PCR: NEGATIVE
Staphylococcus aureus: NEGATIVE

## 2010-12-11 LAB — DIFFERENTIAL
Basophils Absolute: 0 10*3/uL (ref 0.0–0.1)
Basophils Relative: 1 % (ref 0–1)
Eosinophils Absolute: 0.1 10*3/uL (ref 0.0–0.7)
Monocytes Absolute: 0.8 10*3/uL (ref 0.1–1.0)
Monocytes Relative: 10 % (ref 3–12)

## 2010-12-11 LAB — APTT: aPTT: 27 seconds (ref 24–37)

## 2010-12-16 ENCOUNTER — Other Ambulatory Visit (HOSPITAL_COMMUNITY): Payer: 59

## 2010-12-20 ENCOUNTER — Observation Stay (HOSPITAL_COMMUNITY)
Admission: RE | Admit: 2010-12-20 | Discharge: 2010-12-21 | Disposition: A | Discharge status: Home / Self Care | Payer: 59 | Source: Ambulatory Visit | Attending: Orthopedic Surgery | Admitting: Orthopedic Surgery

## 2010-12-20 DIAGNOSIS — J4489 Other specified chronic obstructive pulmonary disease: Secondary | ICD-10-CM | POA: Insufficient documentation

## 2010-12-20 DIAGNOSIS — I10 Essential (primary) hypertension: Secondary | ICD-10-CM | POA: Insufficient documentation

## 2010-12-20 DIAGNOSIS — M19049 Primary osteoarthritis, unspecified hand: Principal | ICD-10-CM | POA: Insufficient documentation

## 2010-12-20 DIAGNOSIS — J449 Chronic obstructive pulmonary disease, unspecified: Secondary | ICD-10-CM | POA: Insufficient documentation

## 2010-12-22 NOTE — H&P (Signed)
NAME:  [name], [name]             ACCOUNT NO.:  [id number]  MEDICAL RECORD NO.:  [id number]           PATIENT TYPE:  O  LOCATION:  DAYL                         FACILITY:  [facility]  PHYSICIAN:  Dionne Ano. 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  PROCEDURE: 1. Left thumb carpometacarpal joint arthroplasty (arthroplasty CMC     joint, left basilar thumb), left wrist thumb region. 2. Abductor pollicis longus digastric portion tendon transfer to the     first metacarpal flexor carpi radialis and back upon itself in the     first metacarpal (Zancolli tendon transfer), left basilar thumb     joint. 3. Abductor pollicis longus one-third proper portion tendon transfer     to the flexor carpi radialis, APL proper and back upon themselves     with multiple figure-of-8 throws (Weilby tendon transfer), left     basilar thumb joint. 4. Abductor pollicis longus tenodesis (short) extension at the wrist     thumb level to prevent dorsolateral escape, left basilar thumb     region and wrist region.  SURGEON:  Dionne Ano. Amanda Pea, MD  ASSISTANT:  None.  COMPLICATIONS:  None.  ANESTHESIA:  General with preoperative block.  TOURNIQUET TIME:  Less than an hour.  INDICATIONS FOR THE PROCEDURE:  This patient is a 65 year old female with end-stage degenerative joint disease.  She presents with above- mentioned diagnoses and we planned for operative intervention in the form of reconstructive arthroplasty with double tendon transfer.  Risks and benefits,do's and don'ts including, but not limited to bleeding, infection,  anesthesia, damage to normal structures, and failure of surgery to accomplish its intended goals of relieving symptoms and restoring function were discussed at length.  At this point, she desires to proceed.  OPERATIVE PROCEDURE:  She was seen in the holding area, counseled, consent verified, given a preoperative block by Dr. Aretta Nip. Following this, she was taken to the operative suite, prepped and draped in the usual sterile fashion about the left upper extremity with Betadine scrub and paint.  A general anesthetic was induced prior to prepping and draping course.  Body parts well-padded, pulses checked, time out called and following this, the operation commenced with dorsal radial incision about the thumb.  Evaluation under anesthesia revealed high degree of crepitance and abnormality.  I opened the area dorsoradially, dissected down.  Superficial radial nerve and deep branch of radial artery were identified, swept out of harm's way to protect the structures.  Following this, EPB and APL were split about their sheaths, capsule was incised and trapezium was then removed piecemeal with combination of curette, rongeur, and orthopedic instruments.  Once this was done, the FCR tenolysis and tenosynovectomy was accomplished in the base of the wound and a drill hole was made dorsal to palmar exiting intra-articularly in line with palmar beak  ligament.  This was irrigated and completed the arthroplasty portion of the procedure (removal of the trapezium of course).  Once this was done, attention was turned towards the tendon harvest portion of the procedure.  The digastric portion APL one-third proper portion were harvested after first dorsal compartment release off the dorsal edge.  These were harvested without difficulty and pulled about their distal insertions. Once this was done, I performed tendon transfer of the APL digastric portion to the first metacarpal, went through the  drill hole dorsal exiting palmar and intra-articularly, it was swept around the FCR and then backed to itself in the metacarpal.  It was then secured with 3-0 FiberWire and this completed the Zancolli tendon transfer.  Following this, the APL one-third proper portion was taken through the FCR back upon the APL proper and with multiple figure-of-8 throws, the tendon was transferred and secured with 3-0 FiberWire in similar locking stitch fashion.  The patient tolerated this well and this of course completed the [facility] tendon transfer.  Following this, the patient underwent a tenodesis of the APL, this was a shortening of the APL tendon to prevent dorsolateral escape and was secured with FiberWire.  The patient tolerated this well.  Following this, she was irrigated copiously as she was during multiple points of the operation.  Gelfoam was placed in the joint space created by the tendon transfer of course.  Once this was done, the patient then underwent closure of the capsule and this was done with FiberWire. Following this, she was irrigated, tourniquet deflated, hemostasis secured, excellent refill was noted and the patient then underwent closure of the wound with interrupted Prolene.  She tolerated the procedure well.  There were no complicating features.  She had excellent refill.  No complications.  No signs of compartment syndrome or other issues.  She was placed in a well-secured bandage followed by a thumb spica splint and taken to the recovery room.  She will be admitted for IV antibiotics, general postoperative observatory care.  We will see her back in the office in 2 weeks for our standard Zancolli protocol.  We will need x-rays at that time and proceed according to the algorithm.     Dionne Ano. Amanda Pea, M.D.     [facility]  D:  12/20/2010  T:  12/20/2010  Job:  [id number]  Electronically Signed by Dominica Severin M.D. on 12/22/2010 07:48:34 AM

## 2010-12-31 ENCOUNTER — Encounter: Payer: Self-pay | Admitting: Pulmonary Disease

## 2011-01-10 NOTE — Assessment & Plan Note (Signed)
[facility]                               PULMONARY OFFICE NOTE   NAME:[name], [name]                    MRN:          [id number]  DATE:06/03/2006                            DOB:          Mar 19, 1946    HISTORY OF PRESENT ILLNESS:  The patient is a 65 year old white female  patient of Dr. Kriste Basque who has a known history of asthmatic bronchitis and  presents for a 5-day history of productive cough with thick yellowish-green  sputum, fever, chills, sweats, and wheezing.  The patient reports she was  recently on a trip to [place] and returned 3 days ago.  While she was on the  trip, she became ill, and several members on the trip also had similar  symptoms.  The patient reports symptoms have not improved with over-the-  counter products  She denies any hemoptysis, chest pain, orthopnea, PND, or  leg swelling.   PAST MEDICAL HISTORY:  Reviewed.   MEDICATIONS:  Reviewed.   PHYSICAL EXAMINATION:  GENERAL:  The patient is a pleasant female in no  acute distress.  VITAL SIGNS:  She is afebrile.  Blood pressure recheck is 170/80  bilaterally.  HEENT:  Nasal mucosa mildly erythematous.  Oropharynx is clear.  NECK:  Supple without adenopathy or JVD.  LUNGS:  Sounds reveal coarse rhonchi bilaterally with a few expiratory  wheezes.  CARDIAC:  Regular rate and rhythm.  ABDOMEN: Soft, nontender.  EXTREMITIES: Warm without any edema.   IMPRESSION AND PLAN:  Acute exacerbation of asthmatic bronchitis.  The  patient is to begin Avelox x5 days, Mucinex DM twice daily, prednisone taper  over the next 2 days.  The patient was given Xopenex nebulizer treatment in  the office.  The patient may use Endal-HD #8 ounces 1 teaspoon every 4-6  hours as needed for cough.  The patient is to return here in one week or  sooner if needed.  Chest x-ray is pending at the time of this dictation.      ______________________________  Rubye Oaks, NP    ______________________________  Lonzo Cloud. Kriste Basque, MD    TP/MedQ  DD:  06/03/2006  DT:  06/04/2006  Job #:  [id number]

## 2011-01-10 NOTE — Assessment & Plan Note (Signed)
[facility]                             PULMONARY OFFICE NOTE   NAME:[name], [name] GLASNER                    MRN:          [id number]  DATE:11/17/2006                            DOB:          07-15-1946    HISTORY OF PRESENT ILLNESS:  The patient is a 65 year old white female  patient of Dr. Kriste Basque who has a known history of asthmatic bronchitis,  who presents to the office with a complaint of a productive cough with  thick green sputum, nasal congestion, shortness of breath, and wheezing.  The patient denies any hemoptysis, orthopnea, PND, or leg swelling.   PAST MEDICAL HISTORY:  Reviewed.   CURRENT MEDICATIONS:  Reviewed.   PHYSICAL EXAM:  The patient is a pleasant female in no acute distress.  She is afebrile with stable vital signs.  Blood pressure recheck is  144/84.  HEENT:  Unremarkable.  NECK:  Supple without cervical adenopathy.  No JVD.  LUNGS:  Sounds reveal expiratory wheezes bilaterally.  CARDIAC:  Regular rate.  ABDOMEN:  Soft and nontender.  EXTREMITIES:  Warm without any edema.   IMPRESSION AND PLAN:  Acute asthmatic bronchitic flare.  The patient is  to begin Omnicef x5 days, Mucinex DM twice daily, prednisone taper over  the next week, Tussionex as needed for cough #4 ounces 1 teaspoon every  12 hours as needed for cough.  The patient will recheck with Dr. Kriste Basque  in 1 month for physical exam.  Have ordered fasting labs to be done  prior to her exam.      Rubye Oaks, NP  Electronically Signed      Lonzo Cloud. Kriste Basque, MD  Electronically Signed   TP/MedQ  DD: 11/17/2006  DT: 11/17/2006  Job #: [id number]

## 2011-01-10 NOTE — Assessment & Plan Note (Signed)
[facility]                             PULMONARY OFFICE NOTE   NAME:[name], [name] WEESNER                    MRN:          [id number]  DATE:09/08/2006                            DOB:          [birth year]    HISTORY OF PRESENT ILLNESS:  This patient is a 65 year old white female  patient of Dr. Jodelle Green, has a known history of asthmatic bronchitis, and  a former smoker, presents to the office with a 3 day history of dry  cough, and wheezing. Patient denies any purulent sputum, fever, chest  pain, orthopnea, PND, or leg swelling. The patient is leaving on  vacation in a few days and is concerned that she has been wheezing, her  wheezing is not getting any better despite albuterol use.   PAST MEDICAL HISTORY:  Reviewed.   CURRENT MEDICATIONS:  Reviewed.   PHYSICAL EXAMINATION:  Patient is a pleasant female in no acute  distress. She is afebrile and stable.  VITAL SIGNS: O2 saturation 98% on room air.  HEENT: Unremarkable.  NECK: Supple without adenopathy, no JVD.  LUNG SOUNDS: Reveal expiratory wheezes bilaterally.  CARDIAC: Regular rate and rhythm.  ABDOMEN: Soft.  EXTREMITIES: Warm without any edema.   IMPRESSION AND PLAN:  Acute asthmatic flare. The patient is to begin a  predinsone pack over the next week. Add in Mucinex DM twice a day. May  use Endal HD # 8 ounces one to two teaspoons every 4 to 6 hours as  needed for cough. The patient is to return as scheduled. The patient is  to return here with Dr. Kriste Basque in 1 month for complete physical exam or  sooner if needed.      Rubye Oaks, NP  Electronically Signed      Lonzo Cloud. Kriste Basque, MD  Electronically Signed   TP/MedQ  DD: 09/08/2006  DT: 09/09/2006  Job #: [id number]

## 2011-01-10 NOTE — Assessment & Plan Note (Signed)
[facility]                               PULMONARY OFFICE NOTE   NAME:[name], [name] AMENDOLA                    MRN:          [id number]  DATE:06/11/2006                            DOB:          12/17/1945    HISTORY OF PRESENT ILLNESS:  The patient is a 65 year old white female,  patient of Dr. Jodelle Green, who has a known history of asthmatic bronchitis, who  returns related to persistent cough and wheezing.  The patient was seen 1  week ago for an acute asthmatic bronchitic exacerbation, and treated with a  5-day course of Avelox, prednisone taper and a Xopenex nebulizer treatment.  The patient returns today, and complains that she is some better but her  symptoms have not totally resolved.  She denies any hemoptysis, chest pain,  orthopnea, PND or leg swelling.  The patient continues to cough up thick,  yellowish-green sputum.  A chest x-ray last visit was unremarkable.   PHYSICAL EXAMINATION:  The patient is a pleasant female in no acute  distress.  She is afebrile.  O2 saturation is 96% on room air.  HEENT:  Unremarkable.  NECK:  Supple without adenopathy or JVD.  LUNGS:  Lung sounds reveal some inspiratory wheezes.  CARDIAC:  Regular rate and rhythm.  ABDOMEN:  Soft and benign.  EXTREMITIES:  Warm with trace edema.   IMPRESSION AND PLAN:  Slow-to-resolve asthmatic bronchitic exacerbation.  The patient was given a Depo-Medrol intramuscular injection in the office  and a Xopenex nebulizer treatment.  She will extend Avelox out for an  additional 5 days and return here in 2 weeks with Dr. Kriste Basque, or sooner if  needed.      ______________________________  Rubye Oaks, NP    ______________________________  Lonzo Cloud. Kriste Basque, MD     TP/MedQ  DD:  06/11/2006  DT:  06/14/2006  Job #:  [id number]

## 2011-01-29 ENCOUNTER — Other Ambulatory Visit: Payer: Self-pay | Admitting: Pulmonary Disease

## 2011-04-04 ENCOUNTER — Telehealth: Payer: Self-pay | Admitting: Pulmonary Disease

## 2011-04-04 NOTE — Telephone Encounter (Signed)
Tried to reach pt at home number and the recording says it is no longer in service.  Msg was started by [facility] with no information in the msg. Pt will need to call back if needing a refill or something else.

## 2011-04-07 ENCOUNTER — Telehealth: Payer: Self-pay | Admitting: Pulmonary Disease

## 2011-04-07 MED ORDER — FLUTICASONE-SALMETEROL 250-50 MCG/DOSE IN AEPB: 1.0000 | INHALATION_SPRAY | Freq: Two times a day (BID) | RESPIRATORY_TRACT | Status: DC

## 2011-04-07 MED ORDER — ALBUTEROL SULFATE HFA 108 (90 BASE) MCG/ACT IN AERS: 2.0000 | INHALATION_SPRAY | Freq: Four times a day (QID) | RESPIRATORY_TRACT | Status: DC | PRN

## 2011-04-07 NOTE — Telephone Encounter (Signed)
Pt is scheduled for f/u with SN on 04/21/2011 and is needing refills on Advair 250/50 and Proair. Medications verified. With the pt and refills sent for 3 month supply to [facility].

## 2011-04-09 ENCOUNTER — Other Ambulatory Visit: Payer: Self-pay | Admitting: *Deleted

## 2011-04-09 MED ORDER — ALBUTEROL SULFATE HFA 108 (90 BASE) MCG/ACT IN AERS: 2.0000 | INHALATION_SPRAY | Freq: Four times a day (QID) | RESPIRATORY_TRACT | Status: DC | PRN

## 2011-04-09 MED ORDER — FLUTICASONE-SALMETEROL 250-50 MCG/DOSE IN AEPB: 1.0000 | INHALATION_SPRAY | Freq: Two times a day (BID) | RESPIRATORY_TRACT | Status: DC

## 2011-04-14 NOTE — Discharge Summary (Signed)
[name], [name] NO.:  [id number]  MEDICAL RECORD NO.:  [id number]  LOCATION:  1610                         FACILITY:  [facility]  PHYSICIAN:  Dionne Ano. Talyia Allende, M.D.DATE OF BIRTH:  Feb 06, 1946  DATE OF ADMISSION:  12/20/2010 DATE OF DISCHARGE:  12/21/2010                              DISCHARGE SUMMARY   ADMITTING DIAGNOSES: 1. Severe end-stage left thumb carpometacarpal degenerative joint     disease with failure of conservative treatment. 2. History of hypertension. 3. History of asthma. 4. History of osteoarthritis.  DISCHARGE DIAGNOSES: 1. Severe end-stage left thumb carpometacarpal degenerative joint     disease with failure of conservative treatment, improved. 2. History of hypertension. 3. History of asthma. 4. History of osteoarthritis.  SURGEON:  Dionne Ano. Amanda Pea, MD  CONSULTS:  None.  PROCEDURE:  Left thumb CMC joint arthroplasty/Zancolli arthroplasty with lever modification.  BRIEF HISTORY OF PRESENT ILLNESS:  [name] is an extremely pleasant female who has a history of painful severe left thumb CMC arthritis who has underwent a full course of conservative measures.  Despite a full conservative treatment regime, she has had continued symptomatology, pain interference with activities of daily living.  She presented electively for left thumb CMC arthroplasty.  She was seen and initially evaluated in the office setting and admitted to undergo the above- mentioned procedure.  Preoperative labs were within normal limits.  The patient was admitted on December 20, 2010, to undergo the above-mentioned procedure.  HOSPITAL COURSE:  [name] was admitted on December 20, 2010, and underwent a left thumb [facility] arthroplasty/Zancolli arthroplasty with lever modification without complications.  She tolerated the procedure very well.  Please see operative report for full details.  She was admitted for general postoperative observation, IV antibiotics, and  pain control. She did very well the evening of surgery without any issues.  The following day, she was doing quite well.  She received 24 hours worth of IV antibiotics.  She was alert and oriented.  Her vital signs were stable.  She was afebrile.  Evaluation of the upper extremity showed neurovascularly she was intact.  Lungs were clear to auscultation. Heart was regular rate and rhythm.  Abdomen was nontender.  She was tolerating p.o.'s and voiding without difficulty.  The decision was made to discharge her home on postoperative day #1.  FINAL DIAGNOSIS: 1. Status post left thumb carpometacarpal arthroplasty/Zancolli     arthroplasty with lever modification. 2. History of hypertension. 3. History of osteoarthritis. 4. History of asthma.  PLAN:  She will be discharged home in stable condition in the care of family.  Condition on discharge was improved in regard to her upper extremity predicament.  Activities will include keeping her splint clean, dry, and intact, elevating the upper extremity frequently, moving her digits frequently to prevent swelling and employing massage techniques to the upper extremity.  DISCHARGE MEDICATIONS: 1. Dilaudid 2 mg 1-2 p.o. q.4-6 hours p.r.n. pain. 2. Robaxin 500 mg 1 p.o. q.6 hours p.r.n. spasm. 3. Vitamin C 1000 mg 1 p.o. daily. 4. Peri-Colace 100 mg 1 p.o. b.i.d.  She will follow up at our office with Dr. Amanda Pea in 12-14 days.  She will call [phone] for questions  or concerns.     Karie Chimera, P.A.-C.   ______________________________ Dionne Ano. Amanda Pea, M.D.    BB/MEDQ  D:  02/13/2011  T:  02/13/2011  Job:  [id number]  Electronically Signed by Karie Chimera P.A.-C. on 03/14/2011 11:50:08 AM Electronically Signed by Dominica Severin M.D. on 04/14/2011 06:34:03 AM

## 2011-04-21 ENCOUNTER — Ambulatory Visit (INDEPENDENT_AMBULATORY_CARE_PROVIDER_SITE_OTHER): Payer: 59 | Admitting: Pulmonary Disease

## 2011-04-21 ENCOUNTER — Telehealth: Payer: Self-pay | Admitting: Pulmonary Disease

## 2011-04-21 ENCOUNTER — Encounter: Payer: Self-pay | Admitting: Pulmonary Disease

## 2011-04-21 VITALS — BP 138/84 | HR 90 | Temp 98.3°F | Ht 62.0 in | Wt 162.6 lb

## 2011-04-21 DIAGNOSIS — J45909 Unspecified asthma, uncomplicated: Secondary | ICD-10-CM

## 2011-04-21 DIAGNOSIS — F411 Generalized anxiety disorder: Secondary | ICD-10-CM

## 2011-04-21 DIAGNOSIS — J209 Acute bronchitis, unspecified: Secondary | ICD-10-CM

## 2011-04-21 DIAGNOSIS — M545 Low back pain, unspecified: Secondary | ICD-10-CM

## 2011-04-21 DIAGNOSIS — I10 Essential (primary) hypertension: Secondary | ICD-10-CM

## 2011-04-21 DIAGNOSIS — E7800 Pure hypercholesterolemia, unspecified: Secondary | ICD-10-CM

## 2011-04-21 MED ORDER — METHYLPREDNISOLONE ACETATE 80 MG/ML IJ SUSP
80.0000 mg | Freq: Once | INTRAMUSCULAR | Status: AC
  Administered 2011-04-21: 80 mg via INTRA_ARTICULAR

## 2011-04-21 MED ORDER — AZITHROMYCIN 250 MG PO TABS: ORAL_TABLET | ORAL | Status: AC

## 2011-04-21 MED ORDER — PREDNISONE (PAK) 5 MG PO TABS: ORAL_TABLET | ORAL | Status: DC

## 2011-04-21 MED ORDER — LOSARTAN POTASSIUM-HCTZ 100-12.5 MG PO TABS: 1.0000 | ORAL_TABLET | Freq: Every day | ORAL | Status: DC

## 2011-04-21 NOTE — Patient Instructions (Signed)
Today we updated your med list in EPIC & refilled your regular meds...  For the Acute Asthmatic Bronchitis:    We gave you a Depo shot...    Start the Prednisone dosepak in the AM & follow the directions on the pack...    Take the ZPak antibiotic as directed...    Pick up some MUCINEX at the Pharm> take 2 tabs twice daily w/ lots of fluids...  Call for any questions.Marland KitchenMarland Kitchen

## 2011-04-21 NOTE — Telephone Encounter (Signed)
Called and spoke with the pharmacy and they are aware that the pred pak was for a 6 day pack.

## 2011-05-04 ENCOUNTER — Encounter: Payer: Self-pay | Admitting: Pulmonary Disease

## 2011-05-04 NOTE — Progress Notes (Signed)
Subjective:    Patient ID: [name], female    DOB: [birth year], 65 y.o.   MRN: [id number]  HPI 65 y/o WF here for a follow up visit & review of her mult med problems...  ~  September 25, 2008:  when last seen Aug09 her FLP was even worse w/ TChol 252, & LDL 180... she agreed to start Simva40 and has been regular w/ her dosing & tol well... here today for f/u FLP... she also has Asthma & has been good about taking her Advair250Bid although she still uses her Proair fairly frequently (several times per week)... NOTE: SHE TELLS ME THAT SHE IS WILLING TO SCHEDULE A COLONOSCOPY!!!  ~  Dec 26, 2009:  she states she had a good yr & feels great... no problems w/ her Asthma at all, she says... she had her colonoscopy by DrDBrodie 2/10 & it was neg- f/u planned 75yrs... she has not been taking her Simva40 ("I forget it half the time") & she's been "doing it on my own w/ really good diet" despite 4# weight gain... she agrees to try Crestor in the AM w/ her other meds if she needs statin therapy... she notes that BP has been great at work- all 130-140/ 70-80 she says;  but todays BP is elevated w/ sl HA & she is quite sure this is a one time aberration since ALL her prev readings were so normal at work... we discussed no salt, get weight down, take med every day, & check BP's often at home, at work, etc (she will call if BP> 150/90).  ~  April 21, 2011:  15 month ROV & refill of meds> Advair, Proventil, Hyzaar...    >She is c/o URI, cough, congestion, thick beige x3d now; sl SOB but denies CP, palpit, f/c/s, etc;  Ex-smoker, quit 2002 w/ hx Asthma & AB in past> she had a good yr taking the Advair & using the Proventil as rescue inhaler;  Exam shows scat rhonchi & we discussed RX w/ ZPak, Pred, Mucinex, Fluids, etc...    >States BP well controlled on Losartan/HCT w/ BP= 138/84 today & asymptomatic...    >Hx Hyperchol & when last seen she was to start Crestor but she decided against it & prefers diet alone  even though her BEST LDL over the last few yrs= 150;  She is not fasting today, she declines to ret for FLP, she declines L:ipid Clinic referral...    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          Problem List:  Hx of ASTHMA, MODERATE (ICD-493.90) - she is an ex-smoker, quit 2002... hx mod asthma w/ occas exac & mult triggers... ? of VCD in the past... she has been taking ADVAIR 250Bid and reports no asthma attacks in several yrs, and she uses the [facility] rescue inhaler as needed... she had trip to [place] and went to [place] in the summer of 2009 without too much difficulty... denies cough, sputum, hemoptysis, worsening dyspnea, wheezing, chest pains, snoring, daytime hypersomnolence, etc...  ~  CXR 5/11 showed chr changes/ COPD, NAD...  HYPERTENSION (ICD-401.9) - hx mod HBP that appears to be well controlled on LOSARTAN/ HCT 100-12.5 daily... BP's at work all 130-140's/ 70-80's and takes med regularly & tolerates well... BP today= 138/84, she has sl HA & fatigue, but denies visual  changes, CP, palipit, dizziness, syncope, dyspnea, edema, etc...  ~  EKG 5/11 showed NSR, WNL.Marland Kitchen. ~  5/11:  we discussed the importance of no salt, get weight down, take med every day, & check BP's often at home, at work, etc (she will call if BP> 150/90). ~  8/12:  BP stable on the Hyzaar & measures 138/84 today...  HYPERCHOLESTEROLEMIA (ICD-272.0) - she tried  to control Chol w/ oatmeal, cheerios, and fish oil... finally agreed to start SIMVASTATIN 40mg /d in Aug09, but couldn't remember to take it half the time since it was at night... she has agreed to try CRESTOR Qam>  ~  FLP 4/08 showed TChol 241, TG 126, HDL 61, LDL 155... rec to start Simvastatin, but chose diet Rx. ~  FLP 8/09 showed TChol 252, TG 96, HDL 57, LDL 180... she agreed to start Simva40. ~  FLP 1/10 showed  TChol 213, TG 72, HDL 50, LDL 150... rec to take med more regularly. ~  FLP 5/11 showed TChol 234, TG 74, HDL 68, LDL 156... admits to not taking Simva40Qpm, ch to CRESTOR10Qam. ~  She never took the Crestor & declines med rx, lipid clinic, or other interventions; she is content to manage this prob on diet alone.  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 eval from DrGioffre and Alusio in the past...  S/P left thumb CMC joint arthroplasty by DrGramig 4/12...  ANXIETY (ICD-300.00)   Past Surgical History  Procedure Date  . Tonsillectomy and adnoidectomy   . Cholecystectomy   . Left thumb cmc arthroplasty 11/2010    by DrGramig    Outpatient Encounter Prescriptions as of 04/21/2011  Medication Sig Dispense Refill  . albuterol (PROVENTIL HFA;VENTOLIN HFA) 108 (90 BASE) MCG/ACT inhaler Inhale 2 puffs into the lungs every 6 (six) hours as needed for wheezing.  3 Inhaler  4  . Fluticasone-Salmeterol (ADVAIR DISKUS) 250-50 MCG/DOSE AEPB Inhale 1 puff into the lungs 2 (two) times daily.  3 each  3  . losartan-hydrochlorothiazide (HYZAAR) 100-12.5 MG per tablet Take 1 tablet by mouth daily.  90 tablet  3  . azithromycin (ZITHROMAX) 250 MG tablet Take 2 tablets (500 mg) on  Day 1,  followed by 1 tablet (250 mg) once daily on Days 2 through 5.  6 each  0  . predniSONE (STERAPRED UNI-PAK) 5 MG TABS Take as directed  1 each  0  . methylPREDNISolone acetate (DEPO-MEDROL) injection 80 mg         Allergies  Allergen Reactions  . Codeine     REACTION: nausea    Current Medications, Allergies, Past Medical History, Past Surgical History, Family History, and Social History were reviewed in [employer] record.    Review of Systems        The patient denies fever, chills, sweats, anorexia, fatigue, weakness, malaise, weight loss, sleep disorder, blurring, diplopia, eye irritation, eye discharge, vision loss, eye pain, photophobia, earache, ear discharge, tinnitus,  decreased hearing, nasal congestion, nosebleeds, sore throat, hoarseness, chest pain, palpitations, syncope, dyspnea on exertion, orthopnea, PND, peripheral edema, cough, dyspnea at rest, excessive sputum, hemoptysis, wheezing, pleurisy, nausea, vomiting, diarrhea, constipation, change in bowel habits, abdominal pain, melena, hematochezia, jaundice, gas/bloating, indigestion/heartburn, dysphagia, odynophagia, dysuria, hematuria, urinary frequency, urinary hesitancy, nocturia, incontinence, back pain, joint pain, joint swelling, muscle cramps, muscle weakness, stiffness, arthritis, sciatica, restless legs, leg pain at night, leg pain with exertion, rash, itching, dryness, suspicious lesions, paralysis, paresthesias, seizures, tremors, vertigo, transient blindness, frequent  falls, frequent headaches, difficulty walking, depression, anxiety, memory loss, confusion, cold intolerance, heat intolerance, polydipsia, polyphagia, polyuria, unusual weight change, abnormal bruising, bleeding, enlarged lymph nodes, urticaria, allergic rash, hay fever, and recurrent infections.     Objective:   Physical Exam    WD, WN, 65 y/o WF in NAD... GENERAL:  Alert & oriented; pleasant & cooperative... HEENT:  [place]/AT, EOM-wnl, PERRLA, EACs-clear, TMs-wnl, NOSE-clear, THROAT-clear & wnl. NECK:  Supple w/ full ROM; no JVD; normal carotid impulses w/o bruits; no thyromegaly or nodules palpated; no lymphadenopathy. CHEST:  Clear to P & A x few scat rhonchi, no wheezing, no consolidation... HEART:  Regular Rhythm; without murmurs/ rubs/ or gallops detected... ABDOMEN:  Soft & nontender; normal bowel sounds; no organomegaly or masses palpated... EXT: without deformities or arthritic changes; no varicose veins/ venous insuffic/ or edema. NEURO:  CN's intact; motor testing normal; sensory testing normal; gait normal & balance OK. DERM:  No lesions noted; no rash etc...   Assessment & Plan:   Asthma/ Asthmatic Bronchitis>  She  has a mod AB exac & we discussed treatment w/ Zithromax, Depo80, Pred Dosepak, Mucinex & lots of fluids...  HBP>  Controlled on the Hyzaar, continue same...  CHOL>  She has signif hyperchol & we discussed this; she is unconcerned & not phased by my warnings regarding the build up of plaque in her vessels and the risk of stroke & MI;  She refuses med rx & refuses lipid clinic referral; she will do the best she can w/ diet alone; she is asked to return for f/u FLP & she will think about it sahe says...  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  Anxiety>  She has rather moderate anxiety but refuses anxiolytic therapy.Marland KitchenMarland Kitchen

## 2011-08-22 ENCOUNTER — Emergency Department (HOSPITAL_COMMUNITY): Payer: 59

## 2011-08-22 ENCOUNTER — Other Ambulatory Visit: Payer: Self-pay

## 2011-08-22 ENCOUNTER — Encounter (HOSPITAL_COMMUNITY): Payer: Self-pay | Admitting: Emergency Medicine

## 2011-08-22 ENCOUNTER — Inpatient Hospital Stay (HOSPITAL_COMMUNITY)
Admission: EM | Admit: 2011-08-22 | Discharge: 2011-09-02 | DRG: 207 | Disposition: A | Discharge status: Rehabilitation Facility | Payer: 59 | Attending: Internal Medicine | Admitting: Internal Medicine

## 2011-08-22 DIAGNOSIS — I4891 Unspecified atrial fibrillation: Secondary | ICD-10-CM

## 2011-08-22 DIAGNOSIS — E872 Acidosis, unspecified: Secondary | ICD-10-CM | POA: Diagnosis present

## 2011-08-22 DIAGNOSIS — J9600 Acute respiratory failure, unspecified whether with hypoxia or hypercapnia: Secondary | ICD-10-CM | POA: Diagnosis not present

## 2011-08-22 DIAGNOSIS — R34 Anuria and oliguria: Secondary | ICD-10-CM | POA: Diagnosis not present

## 2011-08-22 DIAGNOSIS — J45902 Unspecified asthma with status asthmaticus: Secondary | ICD-10-CM

## 2011-08-22 DIAGNOSIS — J209 Acute bronchitis, unspecified: Secondary | ICD-10-CM | POA: Diagnosis present

## 2011-08-22 DIAGNOSIS — J069 Acute upper respiratory infection, unspecified: Secondary | ICD-10-CM

## 2011-08-22 DIAGNOSIS — Z87891 Personal history of nicotine dependence: Secondary | ICD-10-CM

## 2011-08-22 DIAGNOSIS — E876 Hypokalemia: Secondary | ICD-10-CM | POA: Diagnosis not present

## 2011-08-22 DIAGNOSIS — J440 Chronic obstructive pulmonary disease with acute lower respiratory infection: Secondary | ICD-10-CM | POA: Diagnosis present

## 2011-08-22 DIAGNOSIS — T380X5A Adverse effect of glucocorticoids and synthetic analogues, initial encounter: Secondary | ICD-10-CM | POA: Diagnosis not present

## 2011-08-22 DIAGNOSIS — R7309 Other abnormal glucose: Secondary | ICD-10-CM | POA: Diagnosis not present

## 2011-08-22 DIAGNOSIS — I10 Essential (primary) hypertension: Secondary | ICD-10-CM | POA: Diagnosis present

## 2011-08-22 DIAGNOSIS — J14 Pneumonia due to Hemophilus influenzae: Principal | ICD-10-CM | POA: Diagnosis present

## 2011-08-22 DIAGNOSIS — E871 Hypo-osmolality and hyponatremia: Secondary | ICD-10-CM | POA: Diagnosis present

## 2011-08-22 DIAGNOSIS — J449 Chronic obstructive pulmonary disease, unspecified: Secondary | ICD-10-CM

## 2011-08-22 DIAGNOSIS — J45909 Unspecified asthma, uncomplicated: Secondary | ICD-10-CM

## 2011-08-22 DIAGNOSIS — I959 Hypotension, unspecified: Secondary | ICD-10-CM | POA: Diagnosis present

## 2011-08-22 LAB — DIFFERENTIAL
Lymphs Abs: 0.5 10*3/uL — ABNORMAL LOW (ref 0.7–4.0)
Monocytes Relative: 11 % (ref 3–12)
Neutro Abs: 15.8 10*3/uL — ABNORMAL HIGH (ref 1.7–7.7)
Neutrophils Relative %: 86 % — ABNORMAL HIGH (ref 43–77)

## 2011-08-22 LAB — POCT I-STAT, CHEM 8
BUN: 18 mg/dL (ref 6–23)
Chloride: 97 mEq/L (ref 96–112)
Creatinine, Ser: 0.6 mg/dL (ref 0.50–1.10)
Potassium: 3.9 mEq/L (ref 3.5–5.1)
Sodium: 126 mEq/L — ABNORMAL LOW (ref 135–145)

## 2011-08-22 LAB — CBC
Hemoglobin: 14.8 g/dL (ref 12.0–15.0)
RBC: 4.75 MIL/uL (ref 3.87–5.11)

## 2011-08-22 MED ORDER — ENOXAPARIN SODIUM 80 MG/0.8ML ~~LOC~~ SOLN
1.0000 mg/kg | Freq: Once | SUBCUTANEOUS | Status: DC
  Administered 2011-08-22: 75 mg via SUBCUTANEOUS
  Filled 2011-08-22: qty 0.8

## 2011-08-22 MED ORDER — DILTIAZEM HCL 100 MG IV SOLR
10.0000 mg/h | Freq: Once | INTRAVENOUS | Status: DC
  Filled 2011-08-22: qty 100

## 2011-08-22 MED ORDER — LEVALBUTEROL HCL 0.63 MG/3ML IN NEBU
0.6300 mg | INHALATION_SOLUTION | Freq: Four times a day (QID) | RESPIRATORY_TRACT | Status: DC
  Administered 2011-08-22: 0.63 mg via RESPIRATORY_TRACT
  Filled 2011-08-22 (×5): qty 3

## 2011-08-22 MED ORDER — ASPIRIN 300 MG RE SUPP: 300.0000 mg | RECTAL | Status: AC

## 2011-08-22 MED ORDER — DILTIAZEM HCL 50 MG/10ML IV SOLN
10.0000 mg | Freq: Once | INTRAVENOUS | Status: AC
  Administered 2011-08-22: 10 mg via INTRAVENOUS
  Filled 2011-08-22: qty 2

## 2011-08-22 MED ORDER — SODIUM CHLORIDE 0.9 % IV SOLN
250.0000 mL | INTRAVENOUS | Status: DC | PRN
  Administered 2011-08-24: 20:00:00 via INTRAVENOUS

## 2011-08-22 MED ORDER — LEVALBUTEROL HCL 0.63 MG/3ML IN NEBU: 0.6300 mg | INHALATION_SOLUTION | Freq: Four times a day (QID) | RESPIRATORY_TRACT | Status: DC

## 2011-08-22 MED ORDER — SODIUM CHLORIDE 0.9 % IV BOLUS (SEPSIS)
500.0000 mL | Freq: Once | INTRAVENOUS | Status: AC
  Administered 2011-08-22: 500 mL via INTRAVENOUS

## 2011-08-22 MED ORDER — SODIUM CHLORIDE 0.9 % IV SOLN
INTRAVENOUS | Status: DC
  Administered 2011-08-22 – 2011-08-24 (×4): via INTRAVENOUS

## 2011-08-22 MED ORDER — LEVALBUTEROL HCL 0.63 MG/3ML IN NEBU
0.6300 mg | INHALATION_SOLUTION | Freq: Four times a day (QID) | RESPIRATORY_TRACT | Status: DC | PRN
  Administered 2011-08-23: 0.63 mg via RESPIRATORY_TRACT
  Filled 2011-08-22 (×2): qty 3

## 2011-08-22 MED ORDER — ASPIRIN 81 MG PO CHEW
324.0000 mg | CHEWABLE_TABLET | ORAL | Status: AC
  Administered 2011-08-23: 324 mg via ORAL
  Filled 2011-08-22: qty 4

## 2011-08-22 MED ORDER — HEPARIN SODIUM (PORCINE) 5000 UNIT/ML IJ SOLN: 5000.0000 [IU] | Freq: Three times a day (TID) | INTRAMUSCULAR | Status: DC

## 2011-08-22 MED ORDER — PREDNISONE 20 MG PO TABS
40.0000 mg | ORAL_TABLET | Freq: Once | ORAL | Status: AC
  Administered 2011-08-22: 40 mg via ORAL
  Filled 2011-08-22: qty 2

## 2011-08-22 MED ORDER — ADENOSINE 6 MG/2ML IV SOLN
INTRAVENOUS | Status: AC
  Filled 2011-08-22: qty 8

## 2011-08-22 MED ORDER — DILTIAZEM HCL 50 MG/10ML IV SOLN
20.0000 mg | Freq: Once | INTRAVENOUS | Status: AC
  Administered 2011-08-22: 20 mg via INTRAVENOUS
  Filled 2011-08-22: qty 4

## 2011-08-22 MED ORDER — IPRATROPIUM BROMIDE 0.02 % IN SOLN
0.5000 mg | Freq: Four times a day (QID) | RESPIRATORY_TRACT | Status: DC
  Administered 2011-08-22: 0.5 mg via RESPIRATORY_TRACT
  Filled 2011-08-22 (×2): qty 2.5

## 2011-08-22 MED ORDER — LEVALBUTEROL HCL 0.63 MG/3ML IN NEBU
0.6300 mg | INHALATION_SOLUTION | Freq: Once | RESPIRATORY_TRACT | Status: AC
  Administered 2011-08-22: 0.63 mg via RESPIRATORY_TRACT
  Filled 2011-08-22: qty 3

## 2011-08-22 MED ORDER — DILTIAZEM HCL 100 MG IV SOLR
5.0000 mg/h | Freq: Once | INTRAVENOUS | Status: AC
  Administered 2011-08-22: 5 mg/h via INTRAVENOUS
  Filled 2011-08-22: qty 100

## 2011-08-22 MED ORDER — METHYLPREDNISOLONE SODIUM SUCC 125 MG IJ SOLR
60.0000 mg | Freq: Three times a day (TID) | INTRAMUSCULAR | Status: DC
  Administered 2011-08-23 – 2011-08-28 (×17): 60 mg via INTRAVENOUS
  Filled 2011-08-22 (×20): qty 2

## 2011-08-22 NOTE — ED Notes (Signed)
RT paged to administer neb tx

## 2011-08-22 NOTE — ED Notes (Signed)
MD at bedside. 

## 2011-08-22 NOTE — ED Notes (Signed)
ALP bedside to speak with pt, pt resting more comfortable @ this time, cont to monitor

## 2011-08-22 NOTE — ED Notes (Signed)
Per ems.  2 adenosine treatments enroute.  Went down to 100 after first, no effect with second.

## 2011-08-22 NOTE — Consult Note (Signed)
Cardiology IP Consult  Reason for Consult: atrial fibrillation with RVR  Referring Physician: Rubin Payor  HPI: [name] is a 65 y.o.female with history of tobacco abuse, currently abstinent, probable COPD based on tobacco history but carries diagnosis of asthma/reactive airways disease, HTN, HLD, anxiety presented to the ER with the CC of shortness of breath. Pt reports feeling as if she had the "flu" starting about 3-4 days ago with fevers to 102, chills, productive cough of yellowish and occasionally blood tinged sputum, and generlized malaise that progressed to worsening shortness of breath over the past 1-2 days resulting in significant respiratory distress and called EMS for presentation to the ER. Pt denies any history of chest pain, prior chest pain or exertional symptoms, no prior cardiac history, denies palpitations, denies feeling as if her heart is racing/skipping beats/pounding in her chest.  Pt does endorse some lightheadedness and dizziness, no overt syncope or history of syncope. At bas[place], Pt is not on oxygen, denies DOE (however lives a very sedentary lifestyle), sleeps on 2 pillows which is stable, no PND, no LEE or recent weight changes.  Upon EMS arrival, Pt was found to have what was felt to be SVT and was given up to 24mg  adenosine with no response and brought to ER. In ER, Pt was tachycardic with irreg irreg rhythm and was initiated on diltiazem for atrial fibrillation with RVR.  She has received a total of 40mg  diltiazem bolus and is currently on a dilt drip at rate of 5.  Past Medical History  Diagnosis Date  . Moderate asthma   . Hypertension   . Hypercholesteremia   . Lumbar back pain   . Anxiety     Past Surgical History  Procedure Date  . Tonsillectomy and adnoidectomy   . Cholecystectomy   . Left thumb cmc arthroplasty 11/2010    by DrGramig    History reviewed. No pertinent family history.  Social History:  reports that she quit smoking about 8 years ago.  Her smoking use included Cigarettes. She does not have any smokeless tobacco history on file. She reports that she drinks alcohol. Her drug history not on file.  Allergies:  Allergies  Allergen Reactions  . Codeine     REACTION: nausea    Current Facility-Administered Medications  Medication Dose Route Frequency Provider Last Rate Last Dose  . 0.9 %  sodium chloride infusion   Intravenous Continuous Juliet Rude. Rubin Payor, MD 125 mL/hr at 08/22/11 2015    . adenosine (ADENOCARD) 6 MG/2ML injection           . diltiazem (CARDIZEM) 100 mg in dextrose 5 % 100 mL infusion  5 mg/hr Intravenous Once [employer]. Pickering, MD 5 mL/hr at 08/22/11 2014 5 mg/hr at 08/22/11 2014  . diltiazem (CARDIZEM) injection SOLN 10 mg  10 mg Intravenous Once [employer]. Pickering, MD   10 mg at 08/22/11 1829  . diltiazem (CARDIZEM) injection SOLN 10 mg  10 mg Intravenous Once [employer]. Pickering, MD   10 mg at 08/22/11 1915  . diltiazem (CARDIZEM) injection SOLN 20 mg  20 mg Intravenous Once [employer]. Pickering, MD   20 mg at 08/22/11 2010  . levalbuterol (XOPENEX) nebulizer solution 0.63 mg  0.63 mg Nebulization Once [employer]. Pickering, MD   0.63 mg at 08/22/11 2041  . predniSONE (DELTASONE) tablet 40 mg  40 mg Oral Once [employer]. Rubin Payor, MD   40 mg at 08/22/11 2019   Current Outpatient Prescriptions  Medication Sig Dispense  Refill  . albuterol (PROVENTIL HFA;VENTOLIN HFA) 108 (90 BASE) MCG/ACT inhaler Inhale 2 puffs into the lungs every 6 (six) hours as needed for wheezing.  3 Inhaler  4  . Fluticasone-Salmeterol (ADVAIR DISKUS) 250-50 MCG/DOSE AEPB Inhale 1 puff into the lungs 2 (two) times daily.  3 each  3  . losartan-hydrochlorothiazide (HYZAAR) 100-12.5 MG per tablet Take 1 tablet by mouth daily.  90 tablet  3    ROS: A full review of systems is obtained and is negative except as noted in the HPI.  Physical Exam: Blood pressure 123/105, pulse 81, resp. rate 33, SpO2 98.00%.  GENERAL: clear respiratory  distress with accessory muscle use, however is able to speak in full sentences EYES: Extra ocular movements are intact. There is no lid lag. Sclera is anicteric.  ENT: Oropharynx is clear. Dentition is within normal limits.  NECK: Supple. The thyroid is not enlarged.  LYMPH: There are no masses or lymphadenopathy present.  HEART: irreg irregular, tachycardic, no MRG noted, no overt JVD (however resp distress makes evaluation limited) LUNGS: moderate resp distress, +accessory muscle use, prolonged expiratory phase with exp > insp diffuse wheezes, normal percussion, no crackles ABDOMEN: Soft, non-tender, and non-distended with normoactive bowel sounds. There is no hepatosplenomegaly.  EXTREMITIES: No clubbing, cyanosis, or edema.  PULSES: Carotids were +2 and equal bilaterally with no bruits. Femoral pulses were +2 and equal bilaterally. DP/PT pulses were +2 and equal bilaterally.  SKIN: Warm, dry, and intact.  NEUROLOGIC: The patient was oriented to person, place, and time. No overt neurologic deficits were detected.  PSYCH: Normal judgment and insight, mood is appropriate.   Results: Results for orders placed during the hospital encounter of 08/22/11 (from the past 24 hour(s))  CBC     Status: Abnormal   Collection Time   08/22/11  6:25 PM      Component Value Range   WBC 18.3 (*) 4.0 - 10.5 (K/uL)   RBC 4.75  3.87 - 5.11 (MIL/uL)   Hemoglobin 14.8  12.0 - 15.0 (g/dL)   HCT 40.9  81.1 - 91.4 (%)   MCV 86.3  78.0 - 100.0 (fL)   MCH 31.2  26.0 - 34.0 (pg)   MCHC 36.1 (*) 30.0 - 36.0 (g/dL)   RDW 78.2  95.6 - 21.3 (%)   Platelets 216  150 - 400 (K/uL)  DIFFERENTIAL     Status: Abnormal   Collection Time   08/22/11  6:25 PM      Component Value Range   Neutrophils Relative 86 (*) 43 - 77 (%)   Neutro Abs 15.8 (*) 1.7 - 7.7 (K/uL)   Lymphocytes Relative 3 (*) 12 - 46 (%)   Lymphs Abs 0.5 (*) 0.7 - 4.0 (K/uL)   Monocytes Relative 11  3 - 12 (%)   Monocytes Absolute 2.0 (*) 0.1 - 1.0  (K/uL)   Eosinophils Relative 0  0 - 5 (%)   Eosinophils Absolute 0.0  0.0 - 0.7 (K/uL)   Basophils Relative 0  0 - 1 (%)   Basophils Absolute 0.0  0.0 - 0.1 (K/uL)  TROPONIN I     Status: Normal   Collection Time   08/22/11  6:46 PM      Component Value Range   Troponin I <0.30  <0.30 (ng/mL)  POCT I-STAT, CHEM 8     Status: Abnormal   Collection Time   08/22/11  6:53 PM      Component Value Range   Sodium 126 (*)  135 - 145 (mEq/L)   Potassium 3.9  3.5 - 5.1 (mEq/L)   Chloride 97  96 - 112 (mEq/L)   BUN 18  6 - 23 (mg/dL)   Creatinine, Ser 4.09  0.50 - 1.10 (mg/dL)   Glucose, Bld 811 (*) 70 - 99 (mg/dL)   Calcium, Ion 9.14 (*) 1.12 - 1.32 (mmol/L)   TCO2 22  0 - 100 (mmol/L)   Hemoglobin 15.6 (*) 12.0 - 15.0 (g/dL)   HCT 78.2  95.6 - 21.3 (%)    CXR: There is worsening perihilar and infrahilar bronchitic changes and mild interstitial prominence. This may be due to bronchitis or early pneumonitis. Clinical correlation is necessary. No focal infiltrate or pulmonary edema.  EKG: atrial fibrillation with RVR, minimal inferior ST depressions, poor R wave progression  Assessment/Plan:  65 y.o.female with history of tobacco abuse, currently abstinent, probable COPD based on tobacco history but carries diagnosis of asthma/reactive airways disease, HTN, HLD, anxiety presented to the ER with the CC of shortness of breath with clinical history consistent with URI vs COPD exacerbation and was found to be in atrial fibrillation with RVR now on diltiazem drip.  Chronicity of atrial fibrillation unclear at this time, but likely acute to subacute in nature and almost certainly secondary to or exacerbated by underlying active pulmonary process and reactive airways disease.  At this time, would proceed with rate control strategy as chronicity of atrial fibrillation unknown and Pt not on anticoagulation (unless clinical status were to decompensate) and underlying COPD exacerbation likely to limit  success of rhythm control strategy until improvement in respiratory status.  Pt is currently hemodynamically stable without chest pain or other alarming ECG changes and, therefore, no indication for urgent DCCV at this time.  --recommend titration of diltiazem drip up to 15 as tolerated  --agree with IVF resuscitation as Pt with acute febrile illness, hyponatremia probably 2/2 volume depletion, and volume depletion may be exacerbating underling RVR  --maintain electrolyte stability with potassium > 4.0 and magnesium of 2.0  --would treat with enoxaparin full anticoagulation (1mg /kg Q12H) for atrial fibrillation at least in the short-term  --continued management of underlying COPD exacerbation per primary team  --recommend transthoracic echocardiogram in AM as long as rate with improved control  --would check TSH and BNP, no further need for cardiac marker cycling unless clinical status changes  --will address long-term anticoagulation after TTE results available and pending hospital course  --please call if rate continues to be uncontrolled or other acute changes in clinical status  --appreciate consult, please call with questions   [name] 08/22/2011, 10:32 PM

## 2011-08-22 NOTE — ED Notes (Signed)
UXL:KG40<NU> Expected date:08/22/11<BR> Expected time: 5:54 PM<BR> Means of arrival:Ambulance<BR> Comments:<BR> M33 - 65yoF SOB, wheezing, neb tx

## 2011-08-22 NOTE — ED Provider Notes (Signed)
History     CSN: [id number]  Arrival date & time 08/22/11  1803   First MD Initiated Contact with Patient 08/22/11 1813      Chief Complaint  Patient presents with  . Tachycardia  . Shortness of Breath    (Consider location/radiation/quality/duration/timing/severity/associated sxs/prior treatment) The history is provided by the patient.   patient has had shortness of breath and cough for the last several days. She said she feels as if she may have the flu. Today she got worse and called ambulance. She received approximately 2 and half milligrams of albuterol by EMS and she went to tachycardia of about 200 beats per minute. Patient states she has some sort of history of fast heart rate, but can not tell me what it was. No chest pain. She had fevers a few days ago. She sees Dr. Virgina Jock for a pulmonologist and is seen the [place] cardiology.  Past Medical History  Diagnosis Date  . Moderate asthma   . Hypertension   . Hypercholesteremia   . Lumbar back pain   . Anxiety     Past Surgical History  Procedure Date  . Tonsillectomy and adnoidectomy   . Cholecystectomy   . Left thumb cmc arthroplasty 11/2010    by DrGramig    History reviewed. No pertinent family history.  History  Substance Use Topics  . Smoking status: Former Smoker    Types: Cigarettes    Quit date: 08/25/2002  . Smokeless tobacco: Not on file  . Alcohol Use: Yes     social    OB History    Grav Para Term Preterm Abortions TAB SAB Ect Mult Living                  Review of Systems  Constitutional: Positive for fever and fatigue. Negative for activity change.  HENT: Negative for neck stiffness.   Respiratory: Positive for cough, shortness of breath and wheezing. Negative for chest tightness.   Cardiovascular: Positive for palpitations. Negative for chest pain.  Gastrointestinal: Negative for abdominal pain.  Musculoskeletal: Positive for myalgias.  Skin: Negative for pallor.  Neurological:  Negative for seizures.  Psychiatric/Behavioral: Negative for confusion.    Allergies  Codeine  Home Medications   Current Outpatient Rx  Name Route Sig Dispense Refill  . ALBUTEROL SULFATE HFA 108 (90 BASE) MCG/ACT IN AERS Inhalation Inhale 2 puffs into the lungs every 6 (six) hours as needed for wheezing. 3 Inhaler 4  . FLUTICASONE-SALMETEROL 250-50 MCG/DOSE IN AEPB Inhalation Inhale 1 puff into the lungs 2 (two) times daily. 3 each 3  . LOSARTAN POTASSIUM-HCTZ 100-12.5 MG PO TABS Oral Take 1 tablet by mouth daily. 90 tablet 3    BP 159/104  Pulse 138  Resp 31  SpO2 96%  Physical Exam  Constitutional: She appears well-developed.  HENT:  Head: Normocephalic.  Eyes: Pupils are equal, round, and reactive to light.  Cardiovascular:       Severe tachycardia  Pulmonary/Chest: She is in respiratory distress. She has wheezes.       Tachypnea and mild respiratory distress  Abdominal: There is no tenderness. There is no rebound.  Neurological: She is alert.  Skin: Skin is warm.    ED Course  Procedures (including critical care time)  Labs Reviewed  CBC - Abnormal; Notable for the following:    WBC 18.3 (*)    MCHC 36.1 (*)    All other components within normal limits  DIFFERENTIAL - Abnormal; Notable for the following:  Neutrophils Relative 86 (*)    Neutro Abs 15.8 (*)    Lymphocytes Relative 3 (*)    Lymphs Abs 0.5 (*)    Monocytes Absolute 2.0 (*)    All other components within normal limits  POCT I-STAT, CHEM 8 - Abnormal; Notable for the following:    Sodium 126 (*)    Glucose, Bld 181 (*)    Calcium, Ion 1.02 (*)    Hemoglobin 15.6 (*)    All other components within normal limits  TROPONIN I  I-STAT, CHEM 8   Dg Chest Port 1 View  08/22/2011  *RADIOLOGY REPORT*  Clinical Data: Shortness of breath, asthma  PORTABLE CHEST - 1 VIEW  Comparison: 12/26/2009  Findings: Cardiomediastinal silhouette is stable.  There is worsening perihilar and infrahilar bronchitic  changes and mild interstitial prominence.  This may be due to bronchitis or early pneumonitis.  Clinical correlation is necessary.  No focal infiltrate or pulmonary edema.  IMPRESSION:  There is worsening perihilar and infrahilar bronchitic changes and mild interstitial prominence.  This may be due to bronchitis or early pneumonitis.  Clinical correlation is necessary.  No focal infiltrate or pulmonary edema.  Original Report Authenticated By: Natasha Mead, M.D.     No diagnosis found.   Date: 08/22/2011  Rate: 195  Rhythm: supraventricular tachycardia (SVT)  QRS Axis: right  Intervals: unable to see P waves  ST/T Wave abnormalities: indeterminate  Conduction Disutrbances:unable to see P waves  Narrative Interpretation:   Old EKG Reviewed: changes noted   Date: 08/22/2011  Rate: 143  Rhythm: atrial fibrillation  QRS Axis: right  Intervals: afib  ST/T Wave abnormalities: afib  Conduction Disutrbances:afib  Narrative Interpretation:   Old EKG Reviewed: changes noted  CRITICAL CARE Performed by: Billee Cashing   Total critical care time: 30  Critical care time was exclusive of separately billable procedures and treating other patients.  Critical care was necessary to treat or prevent imminent or life-threatening deterioration.  Critical care was time spent personally by me on the following activities: development of treatment plan with patient and/or surrogate as well as nursing, discussions with consultants, evaluation of patient's response to treatment, examination of patient, obtaining history from patient or surrogate, ordering and performing treatments and interventions, ordering and review of laboratory studies, ordering and review of radiographic studies, pulse oximetry and re-evaluation of patient's condition.   MDM  Patient presents with few day history shortness of breath. Likely combination of asthma and a upper respiratory infection. Patient went into an SVT without  resolution by adenosine. He should later appear to be in A. fib with RVR after 40 mg of IV Cardizem. She remained tachycardic. She remained tachypnea. She was given a dose of steroids. [place] cardiology and pulmonary critical care been consulted. She will likely be admitted to the ICU. She's on Cardizem drip at this point.        Juliet Rude. Rubin Payor, MD 08/22/11 2134

## 2011-08-22 NOTE — ED Notes (Signed)
Per ems.  Pt's hr 108 before albuterol treatment, went into svt afterwords.  Pt currently on nonrebreather.  Pt was SOB for the past 2 day, but sob became unbearable

## 2011-08-22 NOTE — ED Notes (Signed)
Pt remains on unit, denies pain, resp even, remains mildly labored, HR 179, MD notified, awaiting orders, cont to monitor

## 2011-08-23 ENCOUNTER — Encounter (HOSPITAL_COMMUNITY): Payer: Self-pay | Admitting: Internal Medicine

## 2011-08-23 ENCOUNTER — Inpatient Hospital Stay (HOSPITAL_COMMUNITY): Payer: 59

## 2011-08-23 DIAGNOSIS — I4891 Unspecified atrial fibrillation: Secondary | ICD-10-CM

## 2011-08-23 LAB — CBC
Hemoglobin: 13.7 g/dL (ref 12.0–15.0)
MCH: 30 pg (ref 26.0–34.0)
MCHC: 34.3 g/dL (ref 30.0–36.0)
Platelets: 202 10*3/uL (ref 150–400)
RBC: 4.56 MIL/uL (ref 3.87–5.11)

## 2011-08-23 LAB — BLOOD GAS, ARTERIAL
Acid-base deficit: 0.8 mmol/L (ref 0.0–2.0)
Bicarbonate: 22.9 mEq/L (ref 20.0–24.0)
Bicarbonate: 24 mEq/L (ref 20.0–24.0)
O2 Saturation: 99.2 %
Patient temperature: 98.6
TCO2: 20.7 mmol/L (ref 0–100)
TCO2: 21.4 mmol/L (ref 0–100)
pCO2 arterial: 42.6 mmHg (ref 35.0–45.0)
pH, Arterial: 7.317 — ABNORMAL LOW (ref 7.350–7.400)
pH, Arterial: 7.37 (ref 7.350–7.400)

## 2011-08-23 LAB — MAGNESIUM: Magnesium: 2.1 mg/dL (ref 1.5–2.5)

## 2011-08-23 LAB — BASIC METABOLIC PANEL
CO2: 23 mEq/L (ref 19–32)
Calcium: 8.9 mg/dL (ref 8.4–10.5)
GFR calc non Af Amer: >90 mL/min (ref >90)
Glucose, Bld: 178 mg/dL — ABNORMAL HIGH (ref 70–99)
Potassium: 3.7 mEq/L (ref 3.5–5.1)
Sodium: 129 mEq/L — ABNORMAL LOW (ref 135–145)

## 2011-08-23 LAB — MRSA PCR SCREENING: MRSA by PCR: NEGATIVE

## 2011-08-23 LAB — PRO B NATRIURETIC PEPTIDE: Pro B Natriuretic peptide (BNP): 746.6 pg/mL — ABNORMAL HIGH (ref 0–125)

## 2011-08-23 LAB — CARDIAC PANEL(CRET KIN+CKTOT+MB+TROPI): Relative Index: 4.4 — ABNORMAL HIGH (ref 0.0–2.5)

## 2011-08-23 LAB — PHOSPHORUS: Phosphorus: 2.3 mg/dL (ref 2.3–4.6)

## 2011-08-23 LAB — CREATININE, SERUM: Creatinine, Ser: 0.47 mg/dL — ABNORMAL LOW (ref 0.50–1.10)

## 2011-08-23 LAB — PROCALCITONIN: Procalcitonin: 0.66 ng/mL

## 2011-08-23 MED ORDER — ENOXAPARIN SODIUM 80 MG/0.8ML ~~LOC~~ SOLN
70.0000 mg | Freq: Two times a day (BID) | SUBCUTANEOUS | Status: DC
  Administered 2011-08-23 – 2011-08-27 (×9): 70 mg via SUBCUTANEOUS
  Filled 2011-08-23 (×11): qty 0.8

## 2011-08-23 MED ORDER — FUROSEMIDE 10 MG/ML IJ SOLN
20.0000 mg | Freq: Once | INTRAMUSCULAR | Status: AC
  Administered 2011-08-23: 20 mg via INTRAVENOUS
  Filled 2011-08-23: qty 2

## 2011-08-23 MED ORDER — OSELTAMIVIR PHOSPHATE 75 MG PO CAPS
75.0000 mg | ORAL_CAPSULE | Freq: Two times a day (BID) | ORAL | Status: AC
  Administered 2011-08-23 – 2011-08-27 (×10): 75 mg via ORAL
  Filled 2011-08-23 (×10): qty 1

## 2011-08-23 MED ORDER — DILTIAZEM HCL 100 MG IV SOLR: 5.0000 mg/h | Freq: Once | INTRAVENOUS | Status: DC

## 2011-08-23 MED ORDER — PANTOPRAZOLE SODIUM 40 MG PO TBEC
40.0000 mg | DELAYED_RELEASE_TABLET | Freq: Every day | ORAL | Status: DC
  Administered 2011-08-23 – 2011-08-24 (×2): 40 mg via ORAL
  Filled 2011-08-23 (×3): qty 1

## 2011-08-23 MED ORDER — AZITHROMYCIN 500 MG PO TABS
500.0000 mg | ORAL_TABLET | Freq: Every day | ORAL | Status: DC
  Administered 2011-08-23 – 2011-08-29 (×8): 500 mg via ORAL
  Filled 2011-08-23 (×4): qty 1
  Filled 2011-08-23: qty 2
  Filled 2011-08-23 (×4): qty 1

## 2011-08-23 MED ORDER — LEVALBUTEROL HCL 0.63 MG/3ML IN NEBU
0.6300 mg | INHALATION_SOLUTION | RESPIRATORY_TRACT | Status: DC
  Administered 2011-08-23 – 2011-08-30 (×48): 0.63 mg via RESPIRATORY_TRACT
  Filled 2011-08-23 (×70): qty 3

## 2011-08-23 MED ORDER — IPRATROPIUM BROMIDE 0.02 % IN SOLN
0.5000 mg | RESPIRATORY_TRACT | Status: DC
  Administered 2011-08-23 – 2011-08-30 (×48): 0.5 mg via RESPIRATORY_TRACT
  Filled 2011-08-23 (×49): qty 2.5

## 2011-08-23 MED ORDER — CLONIDINE HCL 0.1 MG/24HR TD PTWK
0.1000 mg | MEDICATED_PATCH | TRANSDERMAL | Status: DC
  Administered 2011-08-23: 0.1 mg via TRANSDERMAL
  Filled 2011-08-23: qty 1

## 2011-08-23 MED ORDER — DILTIAZEM HCL 100 MG IV SOLR
5.0000 mg/h | INTRAVENOUS | Status: DC
  Administered 2011-08-23: 10 mg/h via INTRAVENOUS
  Administered 2011-08-23: 5 mg/h via INTRAVENOUS
  Filled 2011-08-23: qty 100

## 2011-08-23 MED ORDER — MAGNESIUM SULFATE 40 MG/ML IJ SOLN
2.0000 g | INTRAMUSCULAR | Status: AC
  Administered 2011-08-23: 2 g via INTRAVENOUS
  Filled 2011-08-23: qty 50

## 2011-08-23 MED ORDER — ALBUTEROL SULFATE (5 MG/ML) 0.5% IN NEBU: 2.5000 mg | INHALATION_SOLUTION | RESPIRATORY_TRACT | Status: DC | PRN

## 2011-08-23 MED ORDER — POTASSIUM CHLORIDE CRYS ER 20 MEQ PO TBCR
40.0000 meq | EXTENDED_RELEASE_TABLET | Freq: Once | ORAL | Status: AC
  Administered 2011-08-23: 40 meq via ORAL
  Filled 2011-08-23: qty 2

## 2011-08-23 MED ORDER — ZAFIRLUKAST 20 MG PO TABS
160.0000 mg | ORAL_TABLET | Freq: Once | ORAL | Status: AC
  Administered 2011-08-23: 160 mg via ORAL
  Filled 2011-08-23: qty 8

## 2011-08-23 MED ORDER — DEXTROSE 5 % IV SOLN
1.0000 g | Freq: Every day | INTRAVENOUS | Status: DC
  Administered 2011-08-23 – 2011-08-31 (×10): 1 g via INTRAVENOUS
  Filled 2011-08-23 (×11): qty 10

## 2011-08-23 NOTE — ED Notes (Signed)
RT bedside to provide treatment 

## 2011-08-23 NOTE — H&P (Signed)
[name] is an 65 y.o. female.   Chief Complaint: Asthma/COPD exacerbation, Afib with RVR HPI: Pt is a 65 y/o F with PMHx of moderat persistent asthma, Hx of tobacco abuse, HTN, DL, anxiety who came to the ER due to worsening of SOB, cough, sputum production for the alst 2 days, and was found to have a fib with RVR.  As per pt and medical records, she started having body aches, sore throat, runny nose and cough with yellowish sputum production about 2 days ago. Pt also noticed fevers of 102 at home today and yesterday and progressive SOB and called EMS today. Pt was given albuterol and was found to have afib with RVR with HR in the 200's. Pt was given Adenosine x2 with no response and then was started on cardizem bolus + drip. She denies any chest pain, hemoptysis or syncopal episodes. She reports that her asthma has been under well control on Advair 250/50 with the last asthma exacrebation about 1 year ago. She has never been on mech ventilation and has been using albuterol moreoften for the last 2 days. 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    Past Medical History  Diagnosis Date  . Moderate asthma   . Hypertension   . Hypercholesteremia   . Lumbar back pain   . Anxiety     Past Surgical History  Procedure Date  . Tonsillectomy and adnoidectomy   . Cholecystectomy   . Left thumb cmc arthroplasty 11/2010    by DrGramig    History reviewed. No pertinent family history. Social History:  reports that she quit smoking about 9 years ago. Her smoking use included Cigarettes. She does not have any smokeless tobacco history on file. She reports that she drinks alcohol. Her drug history not on file.  Allergies:  Allergies  Allergen Reactions  . Codeine     REACTION: nausea    Medications Prior to Admission  Medication Dose Route Frequency Provider Last Rate Last Dose  . 0.9 %  sodium chloride infusion   Intravenous Continuous Juliet Rude. Rubin Payor, MD 125 mL/hr at 08/22/11 2015    . 0.9 %  sodium chloride infusion  250 mL Intravenous PRN Orbie Hurst      . adenosine (ADENOCARD) 6 MG/2ML injection           . aspirin chewable tablet 324 mg  324 mg Oral NOW Orbie Hurst       Or  . aspirin suppository 300 mg  300 mg Rectal NOW Orbie Hurst      . azithromycin (ZITHROMAX) tablet 500 mg  500 mg Oral Daily Orbie Hurst      . cefTRIAXone (ROCEPHIN) 1 g in dextrose 5 % 50 mL IVPB  1 g Intravenous Q24H Orbie Hurst      . diltiazem (CARDIZEM) 100 mg in dextrose 5 % 100 mL infusion  5 mg/hr Intravenous Once [employer]. Rubin Payor, MD   5 mg/hr at 08/22/11 2014  . diltiazem (CARDIZEM) 100 mg in dextrose 5 % 100 mL infusion  10 mg/hr Intravenous Once [employer]. Pickering, MD 10 mL/hr at 08/22/11 2321 10 mg/hr at 08/22/11 2321  . diltiazem (CARDIZEM) injection SOLN 10 mg  10 mg Intravenous Once [employer]. Pickering, MD   10 mg at 08/22/11 1829  . diltiazem (CARDIZEM) injection SOLN 10 mg  10 mg Intravenous Once [employer]. Pickering, MD   10 mg at 08/22/11 1915  .  diltiazem (CARDIZEM) injection SOLN 20 mg  20 mg Intravenous Once [employer]. Pickering, MD   20 mg at 08/22/11 2010  . heparin injection 5,000 Units  5,000 Units Subcutaneous Q8H Orbie Hurst      . ipratropium (ATROVENT) nebulizer solution 0.5 mg  0.5 mg Nebulization Q6H Zohaib Heeney   0.5 mg at 08/22/11 2359  . levalbuterol (XOPENEX) nebulizer solution 0.63 mg  0.63 mg Nebulization Once [employer]. Pickering, MD   0.63 mg at 08/22/11 2041  . levalbuterol (XOPENEX) nebulizer solution 0.63 mg  0.63 mg Nebulization Q6H PRN Orbie Hurst      . levalbuterol (XOPENEX) nebulizer solution 0.63 mg  0.63 mg Nebulization Q6H Tracia Lacomb   0.63 mg at 08/22/11 2359  . methylPREDNISolone sodium succinate (SOLU-MEDROL) 125 MG injection 60 mg  60 mg Intravenous Q8H Orbie Hurst      . predniSONE (DELTASONE) tablet 40 mg  40 mg Oral Once [employer]. Pickering, MD   40 mg at 08/22/11 2019  . sodium chloride 0.9 % bolus 500  mL  500 mL Intravenous Once Harrold Donath R. Pickering, MD   500 mL at 08/22/11 2320  . DISCONTD: enoxaparin (LOVENOX) injection 75 mg  1 mg/kg Subcutaneous Once Harrold Donath R. Pickering, MD      . DISCONTD: levalbuterol Pauline Aus) nebulizer solution 0.63 mg  0.63 mg Nebulization Q6H Orbie Hurst       Medications Prior to Admission  Medication Sig Dispense Refill  . albuterol (PROVENTIL HFA;VENTOLIN HFA) 108 (90 BASE) MCG/ACT inhaler Inhale 2 puffs into the lungs every 6 (six) hours as needed for wheezing.  3 Inhaler  4  . Fluticasone-Salmeterol (ADVAIR DISKUS) 250-50 MCG/DOSE AEPB Inhale 1 puff into the lungs 2 (two) times daily.  3 each  3  . losartan-hydrochlorothiazide (HYZAAR) 100-12.5 MG per tablet Take 1 tablet by mouth daily.  90 tablet  3    Results for orders placed during the hospital encounter of 08/22/11 (from the past 48 hour(s))  CBC     Status: Abnormal   Collection Time   08/22/11  6:25 PM      Component Value Range Comment   WBC 18.3 (*) 4.0 - 10.5 (K/uL)    RBC 4.75  3.87 - 5.11 (MIL/uL)    Hemoglobin 14.8  12.0 - 15.0 (g/dL)    HCT 16.1  09.6 - 04.5 (%)    MCV 86.3  78.0 - 100.0 (fL)    MCH 31.2  26.0 - 34.0 (pg)    MCHC 36.1 (*) 30.0 - 36.0 (g/dL)    RDW 40.9  81.1 - 91.4 (%)    Platelets 216  150 - 400 (K/uL)   DIFFERENTIAL     Status: Abnormal   Collection Time   08/22/11  6:25 PM      Component Value Range Comment   Neutrophils Relative 86 (*) 43 - 77 (%)    Neutro Abs 15.8 (*) 1.7 - 7.7 (K/uL)    Lymphocytes Relative 3 (*) 12 - 46 (%)    Lymphs Abs 0.5 (*) 0.7 - 4.0 (K/uL)    Monocytes Relative 11  3 - 12 (%)    Monocytes Absolute 2.0 (*) 0.1 - 1.0 (K/uL)    Eosinophils Relative 0  0 - 5 (%)    Eosinophils Absolute 0.0  0.0 - 0.7 (K/uL)    Basophils Relative 0  0 - 1 (%)    Basophils Absolute 0.0  0.0 - 0.1 (K/uL)   TROPONIN I  Status: Normal   Collection Time   08/22/11  6:46 PM      Component Value Range Comment   Troponin I <0.30  <0.30 (ng/mL)   POCT  I-STAT, CHEM 8     Status: Abnormal   Collection Time   08/22/11  6:53 PM      Component Value Range Comment   Sodium 126 (*) 135 - 145 (mEq/L)    Potassium 3.9  3.5 - 5.1 (mEq/L)    Chloride 97  96 - 112 (mEq/L)    BUN 18  6 - 23 (mg/dL)    Creatinine, Ser 0.98  0.50 - 1.10 (mg/dL)    Glucose, Bld 119 (*) 70 - 99 (mg/dL)    Calcium, Ion 1.47 (*) 1.12 - 1.32 (mmol/L)    TCO2 22  0 - 100 (mmol/L)    Hemoglobin 15.6 (*) 12.0 - 15.0 (g/dL)    HCT 82.9  56.2 - 13.0 (%)    Dg Chest Port 1 View  08/22/2011  *RADIOLOGY REPORT*  Clinical Data: Shortness of breath, asthma  PORTABLE CHEST - 1 VIEW  Comparison: 12/26/2009  Findings: Cardiomediastinal silhouette is stable.  There is worsening perihilar and infrahilar bronchitic changes and mild interstitial prominence.  This may be due to bronchitis or early pneumonitis.  Clinical correlation is necessary.  No focal infiltrate or pulmonary edema.  IMPRESSION:  There is worsening perihilar and infrahilar bronchitic changes and mild interstitial prominence.  This may be due to bronchitis or early pneumonitis.  Clinical correlation is necessary.  No focal infiltrate or pulmonary edema.  Original Report Authenticated By: Natasha Mead, M.D.    ROS all other systems were negative except as above in HPI.  Blood pressure 173/91, pulse 124, resp. rate 39, height 5\' 2"  (1.575 m), weight 73.483 kg (162 lb), SpO2 98.00%. Physical Exam Pt is alert, oriented x3 in no acute resp distress HEENT: no JVD, no oral trush or postnasal drip CV: irregular rhythm, S1 and S2 normal Lungs: decreased air entry bilat with bilateral inspiratory and expiratory wheezes. No rhonchi or crackles Abdomen: soft, non tender, non distended. BS + No peritoneal signs. Extrem: no low extrem edema, no calf tenderness. Neurol: no focal deficit.  Assessment/Plan Pt is a 65 y/o F with PMHx of moderat persistent asthma, Hx of tobacco abuse, HTN, DL, anxiety who came to the ER due to worsening  of SOB, cough, sputum production for the last 2 days, and was found to have a fib with RVR.  1. Asthma/COPD excerbation - Pt has Hx of moder persistent asthma, and Hx of tobacco abuse (15 p/yHx) - Actively wheezing - CXR with possible bronchitis. - CBC with elevated WBC - Will start Solumedrol IV - Xopenex low dose and Ipratropium - Resp cultures - Rapid flu test - Ceftriaxone and Azithromycin - O 2 sat 98% on 2LNC  2. A fib with RVR - [place] due to acute resp infection/asthma exacerbation - TSH pending - Started on cardizem, HR now 120's, BP stable - Cardiology on consult. - Enoxaparin 1mg /kg q12h as per cardiology for now. - TTE pending - Cont Cardizem  3. HTN - BP stable - On cardizem drip for now  4. DVT prophylaxis - Anticoag with Lovenox for now  FEN - Cardizem drip, NS 20 ml/h, replace electrolytes as per ICU protocol, NPO for now     [facility] 08/23/2011, 12:03 AM

## 2011-08-23 NOTE — H&P (Addendum)
KEYLIE BEAVERS is an 65 y.o. female.    Chief Complaint: Asthma/COPD exacerbation, Afib with RVR  HP 12/28I: Pt is a 65 y/o F with PMHx of moderat persistent asthma, Hx of tobacco abuse, HTN, DL, anxiety who came to the ER due to worsening of SOB, cough, sputum production for the alst 2 days, and was found to have a fib with RVR.  As per pt and medical records, she started having body aches, sore throat, runny nose and cough with yellowish sputum production about 2 days ago. Pt also noticed fevers of 102 at home today and yesterday and progressive SOB and called EMS today. Pt was given albuterol and was found to have afib with RVR with HR in the 200's. Pt was given Adenosine x2 with no response and then was started on cardizem bolus + drip. She denies any chest pain, hemoptysis or syncopal episodes. She reports that her asthma has been under well control on Advair 250/50 with the last asthma exacrebation about 1 year ago. She has never been on mech ventilation and has been using albuterol moreoften for the last 2 days. 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   DEVICES  CULTURES Results for orders placed during the hospital encounter of 08/22/11  CULTURE, RESPIRATORY     Status: Normal (Preliminary result)   Collection Time   08/23/11 12:22 AM      Component Value Range Status Comment   Specimen Description SPUTUM   Final    Special Requests Normal   Final    Gram Stain     Final    Value: RARE WBC PRESENT, PREDOMINANTLY PMN     NO SQUAMOUS EPITHELIAL CELLS SEEN     RARE GRAM POSITIVE COCCI     IN PAIRS RARE GRAM NEGATIVE RODS   Culture PENDING   Incomplete    Report Status PENDING   Incomplete   MRSA PCR SCREENING     Status: Normal   Collection Time   08/23/11  3:44 AM      Component Value Range Status Comment   MRSA by PCR NEGATIVE  NEGATIVE  Final        ABX Anti-infectives     Start     Dose/Rate Route Frequency Ordered Stop   08/23/11 0015   cefTRIAXone (ROCEPHIN) 1 g in dextrose 5 % 50 mL IVPB        1 g 100 mL/hr over 30 Minutes Intravenous Daily at bedtime 08/23/11 0000     08/23/11 0015   azithromycin (ZITHROMAX) tablet 500 mg        500 mg Oral Daily 08/23/11 0001           TAMIFLU 12/29 >>  [facility] Lovenox Rx Dose Protonix    OVERNIGHT  Still on cardizem, Very dyspneic and wheezy. Reports baseline wel controlled moderate persistent asthma on advair. No prednisoone, ICU or ER visits for 2012.     ROS   all other systems were negative except as above in HPI.  Blood pressure 153/85, pulse 104, temperature 97.8 F (36.6 C), temperature source Oral, resp. rate 25, height 5\' 2"  (1.575 m), weight 74 kg (163 lb 2.3 oz), SpO2 96.00%.   Physical Exam Pt is alert, oriented x3 in no acute resp distress HEENT: no JVD, no oral trush or postnasal drip. Looks cushingoid CV: irregular rhythm, S1 and S2 normal Lungs: decreased air entry bilat with bilateral inspiratory and  expiratory wheezes. No rhonchi or crackles. HAs DIFFICULTY COMPLETING SENTENCES Abdomen: soft, non tender, non distended. BS + No peritoneal signs. Extrem: no low extrem edema, no calf tenderness. Neurol: no focal deficit.   Dg Chest Port 1 View  08/22/2011  *RADIOLOGY REPORT*  Clinical Data: Shortness of breath, asthma  PORTABLE CHEST - 1 VIEW  Comparison: 12/26/2009  Findings: Cardiomediastinal silhouette is stable.  There is worsening perihilar and infrahilar bronchitic changes and mild interstitial prominence.  This may be due to bronchitis or early pneumonitis.  Clinical correlation is necessary.  No focal infiltrate or pulmonary edema.  IMPRESSION:  There is worsening perihilar and infrahilar bronchitic changes and mild interstitial prominence.  This may be due to bronchitis or early pneumonitis.  Clinical correlation is necessary.  No focal infiltrate or pulmonary edema.  Original Report Authenticated By: Natasha Mead, M.D.    LABS  BMET    Component Value Date/Time   NA 126* 08/22/2011 1853   K 3.9 08/22/2011 1853   CL 97 08/22/2011 1853   CO2 28 12/11/2010 1510   GLUCOSE 181* 08/22/2011 1853   BUN 18 08/22/2011 1853   CREATININE 0.60 08/22/2011 1853   CALCIUM 9.3 12/11/2010 1510   GFRNONAA >90 08/22/2011 1846   GFRAA >90 08/22/2011 1846    CBC    Component Value Date/Time   WBC 18.3* 08/22/2011 1825   RBC 4.75 08/22/2011 1825   HGB 15.6* 08/22/2011 1853   HCT 46.0 08/22/2011 1853   PLT 216 08/22/2011 1825   MCV 86.3 08/22/2011 1825   MCH 31.2 08/22/2011 1825   MCHC 36.1* 08/22/2011 1825   RDW 12.8 08/22/2011 1825   LYMPHSABS 0.5* 08/22/2011 1825   MONOABS 2.0* 08/22/2011 1825   EOSABS 0.0 08/22/2011 1825   BASOSABS 0.0 08/22/2011 1825    ABG    Component Value Date/Time   TCO2 22 08/22/2011 1853       Assessment/Plan Pt is a 65 y/o F with PMHx of moderat persistent asthma, Hx of tobacco abuse, HTN, DL, anxiety who came to the ER due to worsening of SOB, cough, sputum production for the last 2 days, and was found to have a fib with RVR.  1. Asthma/COPD excerbation/ACute REsp Failure - Pt has Hx of moder persistent asthma, and Hx of tobacco abuse (15 p/yHx) - Actively wheezing.  CXR with possible bronchitis. PLAN  - increase neb frequency - stat Magnesium sulfate 2gm IV - stat zafirlukast load x 1 for AE asthma - check abg and lactate; bipap depending on that - continue solumedrol - add protoniox - keep npo    2. A fib with RVR - [place] due to acute resp infection/asthma exacerbation - TSH pending - Started on cardizem, HR now 120's, BP stable PLAN - Cardiology on consult. - Enoxaparin 1mg /kg q12h as per cardiology for now. - TTE pending - Cont Cardizem  3. HTN - BP stable - On cardizem drip for now  4. DVT prophylaxis - Anticoag with Lovenox for now  5. Hyponatremia - Cardizem drip, NS 20 ml/h, replace electrolytes as per ICU protocol, NPO for now -  recheck lytes     Kenishia Plack 08/23/2011, 8:01 AM The patient is critically ill with multiple organ systems failure and requires high complexity decision making for assessment and support, frequent evaluation and titration of therapies, application of advanced monitoring technologies and extensive interpretation of multiple databases.   Critical Care Time devoted to patient care services described in this note is  45 minutes.  13:20: Reviewed labs as sure that she has mild respiratory acidosis. Though she states she is an asthmatic she might have COPD because of smoking history nevertheless she needs indication for BiPAP especially as nursing tells me that she is still in respiratory distress. BNP is mildly elevated and therefore we'll give her a dose of Lasix as well. Check ABG after starting BiPAP. I have warned her of risk of intubation earlier today

## 2011-08-23 NOTE — Progress Notes (Signed)
[name], daughter of Ms. Kueker called from her home out of town ([place]) and requesting information about her mother. Pt states that it is ok to give out information since she is to SOB to talk to her at this time. Password made.Marland KitchenMarland KitchenRidley. Daughter contact information if needed is [phone]

## 2011-08-23 NOTE — Progress Notes (Signed)
Dr. Vassie Loll notified about pts ABGs per previous order. MD ok to continue with current treatment; no further orders given.

## 2011-08-23 NOTE — ED Notes (Signed)
Pt remains on unit, report called, awaiting inpt bed to be cleaned

## 2011-08-23 NOTE — ED Notes (Signed)
Pt resting in bed. Watching TV. Very pleasant. In NAD.

## 2011-08-23 NOTE — Consult Note (Signed)
[name]  65 y.o.  female  Subjective: Intermittent dyspnea; no palpitations nor chest discomfort.  Allergy: Codeine  Objective: Vital signs in last 24 hours: Temp:  [97.6 F (36.4 C)-97.8 F (36.6 C)] 97.6 F (36.4 C) (12/29 0804) Pulse Rate:  [81-185] 104  (12/29 0410) Resp:  [24-40] 25  (12/29 0410) BP: (123-182)/(67-142) 153/85 mmHg (12/29 0410) SpO2:  [92 %-100 %] 96 % (12/29 0410) FiO2 (%):  [3 %] 3 % (12/29 0342) Weight:  [73.483 kg (162 lb)-74 kg (163 lb 2.3 oz)] 163 lb 2.3 oz (74 kg) (12/29 0500)  163 lb 2.3 oz (74 kg) Body mass index is 29.84 kg/(m^2).  Weight change:  Last BM Date: 08/22/11  Intake/Output from previous day: 12/28 0701 - 12/29 0700 In: 125 [I.V.:125] Out: 350 [Urine:350]  General- Well developed; moderate respiratory distress  Neck- No JVD, no carotid bruits Lungs-prolonged exp. Phase; exp. wheeze Cardiovascular- normal PMI; normal S1 and S2; irreg Abdomen- normal bowel sounds; soft and non-tender without masses or organomegaly Skin- Warm, no significant lesions Extremities- Nl distal pulses; no edema  Lab Results: Cardiac Markers:   Basename 08/22/11 1846  TROPONINI <0.30   CBC:   Basename 08/23/11 0805 08/22/11 1853 08/22/11 1825  WBC 12.0* -- 18.3*  HGB 13.7 15.6* --  HCT 40.0 46.0 --  PLT 202 -- 216   BMET:  Basename 08/23/11 0805 08/22/11 1853  NA 129* 126*  K 3.7 3.9  CL 94* 97  CO2 23 --  GLUCOSE 178* 181*  BUN 15 18  CREATININE 0.31* 0.60  CALCIUM 8.9 --   Hepatic Function:  No results found for this basename: PROT,ALBUMIN,AST,ALT,ALKPHOS,BILITOT,BILIDIR,IBILI in the last 72 hours GFR:  Estimated Creatinine Clearance: 67.8 ml/min (by C-G formula based on Cr of 0.31). Lipids:  No results found for this basename: CHOL,TRIG,HDL,LDL in the last 72 hours  Imaging Studies/Results: Dg Chest Port 1 View  08/22/2011  *RADIOLOGY REPORT*  Clinical Data: Shortness of breath, asthma  PORTABLE CHEST - 1 VIEW  Comparison:  12/26/2009  Findings: Cardiomediastinal silhouette is stable.  There is worsening perihilar and infrahilar bronchitic changes and mild interstitial prominence.  This may be due to bronchitis or early pneumonitis.  Clinical correlation is necessary.  No focal infiltrate or pulmonary edema.  IMPRESSION:  There is worsening perihilar and infrahilar bronchitic changes and mild interstitial prominence.  This may be due to bronchitis or early pneumonitis.  Clinical correlation is necessary.  No focal infiltrate or pulmonary edema.  Original Report Authenticated By: Natasha Mead, M.D.    Imaging: Imaging results have been reviewed  Medications: I have reviewed the patient's current medications.  Infusions:     . sodium chloride 125 mL/hr at 08/23/11 0400    Active Problems:  * No active hospital problems. *    Assessment/Plan: AF: Heart rate controlled with IV diltiazem->continue.  On full dose lovenox, but risk of thromboembolism relatively low.  Echo pending.  Normal TSH.  Hypertension: intermittent mild elevations of BP.  Will add transdermal clonidine.  LOS: 1 day   Harvel Bing 08/23/2011, 9:27 AM

## 2011-08-23 NOTE — Progress Notes (Signed)
*  PRELIMINARY RESULTS* Echocardiogram 2D Echocardiogram has been performed.  [name] 08/23/2011, 12:02 PM

## 2011-08-23 NOTE — Progress Notes (Signed)
Pt states "she couldn't breath" when attempted to place her on NIV, even at lowest settings, 5/5  RN notified, returned pt to 4L [place]

## 2011-08-23 NOTE — Progress Notes (Signed)
CRITICAL VALUE ALERT  Critical value received: CKMB 8.4   Date of notification:  29Dec2012  Time of notification:  1705  Critical value read back:yes  Nurse who received alert:  Georgia Lopes, RN  MD notified (1st page):  Dr. Vassie Loll  Time of first page:  1705  MD notified (2nd page):  Time of second page:  Responding MD:   Time MD responded:  [phone]

## 2011-08-24 ENCOUNTER — Inpatient Hospital Stay (HOSPITAL_COMMUNITY): Payer: 59

## 2011-08-24 DIAGNOSIS — I4891 Unspecified atrial fibrillation: Secondary | ICD-10-CM

## 2011-08-24 DIAGNOSIS — R0602 Shortness of breath: Secondary | ICD-10-CM

## 2011-08-24 DIAGNOSIS — J111 Influenza due to unidentified influenza virus with other respiratory manifestations: Secondary | ICD-10-CM

## 2011-08-24 DIAGNOSIS — I10 Essential (primary) hypertension: Secondary | ICD-10-CM

## 2011-08-24 DIAGNOSIS — J9600 Acute respiratory failure, unspecified whether with hypoxia or hypercapnia: Secondary | ICD-10-CM

## 2011-08-24 DIAGNOSIS — J45902 Unspecified asthma with status asthmaticus: Secondary | ICD-10-CM

## 2011-08-24 DIAGNOSIS — J449 Chronic obstructive pulmonary disease, unspecified: Secondary | ICD-10-CM

## 2011-08-24 LAB — CBC
HCT: 39.6 % (ref 36.0–46.0)
Hemoglobin: 13.4 g/dL (ref 12.0–15.0)
Hemoglobin: 13.6 g/dL (ref 12.0–15.0)
MCH: 30.5 pg (ref 26.0–34.0)
MCH: 30.6 pg (ref 26.0–34.0)
Platelets: 266 10*3/uL (ref 150–400)
RBC: 4.4 MIL/uL (ref 3.87–5.11)
RBC: 4.44 MIL/uL (ref 3.87–5.11)
WBC: 14.6 10*3/uL — ABNORMAL HIGH (ref 4.0–10.5)

## 2011-08-24 LAB — BLOOD GAS, ARTERIAL
Acid-Base Excess: 1.1 mmol/L (ref 0.0–2.0)
Acid-Base Excess: 0 mmol/L (ref 0.0–2.0)
Acid-Base Excess: 0.7 mmol/L (ref 0.0–2.0)
Bicarbonate: 28.7 mEq/L — ABNORMAL HIGH (ref 20.0–24.0)
Bicarbonate: 28.8 mEq/L — ABNORMAL HIGH (ref 20.0–24.0)
FIO2: 0.5 %
FIO2: 0.6 %
FIO2: 0.7 %
O2 Saturation: 99.6 %
O2 Saturation: 99.5 %
O2 Saturation: 99.6 %
PEEP: 5 cmH2O
Patient temperature: 98.6
Patient temperature: 98.6
Patient temperature: 98.6
TCO2: 26.7 mmol/L (ref 0–100)
pO2, Arterial: 192 mmHg — ABNORMAL HIGH (ref 80.0–100.0)
pO2, Arterial: 345 mmHg — ABNORMAL HIGH (ref 80.0–100.0)

## 2011-08-24 LAB — CARDIAC PANEL(CRET KIN+CKTOT+MB+TROPI)
CK, MB: 4.1 ng/mL — ABNORMAL HIGH (ref 0.3–4.0)
CK, MB: 5.9 ng/mL — ABNORMAL HIGH (ref 0.3–4.0)
CK, MB: 7.2 ng/mL (ref 0.3–4.0)
Relative Index: 5.6 — ABNORMAL HIGH (ref 0.0–2.5)
Relative Index: INVALID (ref 0.0–2.5)
Relative Index: INVALID (ref 0.0–2.5)
Relative Index: INVALID (ref 0.0–2.5)
Total CK: 86 U/L (ref 7–177)
Total CK: 53 U/L (ref 7–177)
Troponin I: <0.3 ng/mL (ref <0.30)
Troponin I: <0.3 ng/mL (ref <0.30)
Troponin I: <0.3 ng/mL (ref <0.30)
Troponin I: <0.3 ng/mL (ref <0.30)

## 2011-08-24 LAB — BASIC METABOLIC PANEL
BUN: 19 mg/dL (ref 6–23)
BUN: 18 mg/dL (ref 6–23)
CO2: 27 mEq/L (ref 19–32)
Chloride: 98 mEq/L (ref 96–112)
Chloride: 97 mEq/L (ref 96–112)
Creatinine, Ser: 0.36 mg/dL — ABNORMAL LOW (ref 0.50–1.10)
GFR calc Af Amer: >90 mL/min (ref >90)
GFR calc non Af Amer: >90 mL/min (ref >90)
Glucose, Bld: 159 mg/dL — ABNORMAL HIGH (ref 70–99)
Glucose, Bld: 179 mg/dL — ABNORMAL HIGH (ref 70–99)
Potassium: 4.6 mEq/L (ref 3.5–5.1)
Potassium: 4.3 mEq/L (ref 3.5–5.1)
Sodium: 133 mEq/L — ABNORMAL LOW (ref 135–145)

## 2011-08-24 LAB — DIFFERENTIAL
Basophils Relative: 1 % (ref 0–1)
Basophils Relative: 0 % (ref 0–1)
Eosinophils Relative: 0 % (ref 0–5)
Eosinophils Relative: 0 % (ref 0–5)
Lymphocytes Relative: 4 % — ABNORMAL LOW (ref 12–46)
Lymphocytes Relative: 4 % — ABNORMAL LOW (ref 12–46)
Monocytes Absolute: 1 10*3/uL (ref 0.1–1.0)
Monocytes Absolute: 1.2 10*3/uL — ABNORMAL HIGH (ref 0.1–1.0)
Neutro Abs: 12.2 10*3/uL — ABNORMAL HIGH (ref 1.7–7.7)
Neutrophils Relative %: 88 % — ABNORMAL HIGH (ref 43–77)
Neutrophils Relative %: 88 % — ABNORMAL HIGH (ref 43–77)

## 2011-08-24 LAB — PROTIME-INR
INR: 0.98 (ref 0.00–1.49)
Prothrombin Time: 13.2 seconds (ref 11.6–15.2)

## 2011-08-24 LAB — MAGNESIUM: Magnesium: 2.4 mg/dL (ref 1.5–2.5)

## 2011-08-24 LAB — APTT: aPTT: 29 seconds (ref 24–37)

## 2011-08-24 MED ORDER — SUCCINYLCHOLINE CHLORIDE 20 MG/ML IJ SOLN
INTRAMUSCULAR | Status: AC
  Filled 2011-08-24: qty 10

## 2011-08-24 MED ORDER — SODIUM CHLORIDE 0.9 % IV SOLN
50.0000 ug/h | INTRAVENOUS | Status: DC
  Administered 2011-08-24 – 2011-08-26 (×2): 50 ug/h via INTRAVENOUS
  Administered 2011-08-26: 75 ug/h via INTRAVENOUS
  Administered 2011-08-27: 25 ug/h via INTRAVENOUS
  Filled 2011-08-24 (×3): qty 50

## 2011-08-24 MED ORDER — MORPHINE SULFATE 2 MG/ML IJ SOLN
INTRAMUSCULAR | Status: AC
  Administered 2011-08-24: 2 mg via INTRAVENOUS
  Filled 2011-08-24: qty 1

## 2011-08-24 MED ORDER — FENTANYL CITRATE 0.05 MG/ML IJ SOLN
INTRAMUSCULAR | Status: AC
Start: 2011-08-24 — End: 2011-08-24
  Administered 2011-08-24: 100 ug via INTRAVENOUS
  Filled 2011-08-24: qty 4

## 2011-08-24 MED ORDER — FENTANYL BOLUS VIA INFUSION
50.0000 ug | Freq: Four times a day (QID) | INTRAVENOUS | Status: DC | PRN
  Administered 2011-08-27: 75 ug via INTRAVENOUS
  Filled 2011-08-24: qty 100

## 2011-08-24 MED ORDER — ROCURONIUM BROMIDE 50 MG/5ML IV SOLN
70.0000 mg | Freq: Once | INTRAVENOUS | Status: AC
  Administered 2011-08-24: 70 mg via INTRAVENOUS

## 2011-08-24 MED ORDER — ETOMIDATE 2 MG/ML IV SOLN
INTRAVENOUS | Status: AC
  Filled 2011-08-24: qty 20

## 2011-08-24 MED ORDER — MIDAZOLAM HCL 5 MG/ML IJ SOLN
INTRAMUSCULAR | Status: AC
  Administered 2011-08-24: 4 mg
  Filled 2011-08-24: qty 2

## 2011-08-24 MED ORDER — ETOMIDATE 2 MG/ML IV SOLN
20.0000 mg | Freq: Once | INTRAVENOUS | Status: AC
  Administered 2011-08-24: 20 mg via INTRAVENOUS

## 2011-08-24 MED ORDER — DILTIAZEM HCL 100 MG IV SOLR
15.0000 mg/h | INTRAVENOUS | Status: DC
  Administered 2011-08-24 – 2011-08-26 (×6): 15 mg/h via INTRAVENOUS
  Filled 2011-08-24: qty 100

## 2011-08-24 MED ORDER — MIDAZOLAM HCL 2 MG/2ML IJ SOLN: 4.0000 mg | Freq: Once | INTRAMUSCULAR | Status: DC

## 2011-08-24 MED ORDER — FENTANYL CITRATE 0.05 MG/ML IJ SOLN
100.0000 ug | Freq: Once | INTRAMUSCULAR | Status: AC
Start: 2011-08-24 — End: 2011-08-24
  Administered 2011-08-24: 100 ug via INTRAVENOUS

## 2011-08-24 MED ORDER — LIDOCAINE HCL (CARDIAC) 20 MG/ML IV SOLN
INTRAVENOUS | Status: AC
  Filled 2011-08-24: qty 5

## 2011-08-24 MED ORDER — CHLORHEXIDINE GLUCONATE 0.12 % MT SOLN
15.0000 mL | Freq: Two times a day (BID) | OROMUCOSAL | Status: DC
Start: 2011-08-24 — End: 2011-09-02
  Administered 2011-08-24 – 2011-09-02 (×18): 15 mL via OROMUCOSAL
  Filled 2011-08-24 (×20): qty 15

## 2011-08-24 MED ORDER — MIDAZOLAM BOLUS VIA INFUSION
1.0000 mg | INTRAVENOUS | Status: DC | PRN
  Filled 2011-08-24: qty 2

## 2011-08-24 MED ORDER — MORPHINE SULFATE 2 MG/ML IJ SOLN
2.0000 mg | Freq: Once | INTRAMUSCULAR | Status: AC
  Administered 2011-08-24: 2 mg via INTRAVENOUS

## 2011-08-24 MED ORDER — BIOTENE DRY MOUTH MT LIQD
15.0000 mL | Freq: Two times a day (BID) | OROMUCOSAL | Status: DC
  Administered 2011-08-24 – 2011-09-02 (×16): 15 mL via OROMUCOSAL

## 2011-08-24 MED ORDER — ROCURONIUM BROMIDE 50 MG/5ML IV SOLN
INTRAVENOUS | Status: AC
  Filled 2011-08-24: qty 2

## 2011-08-24 MED ORDER — SODIUM CHLORIDE 0.9 % IV SOLN
2.0000 mg/h | INTRAVENOUS | Status: DC
  Administered 2011-08-24 – 2011-08-25 (×2): 2 mg/h via INTRAVENOUS
  Administered 2011-08-25: 3 mg/h via INTRAVENOUS
  Filled 2011-08-24 (×5): qty 10

## 2011-08-24 NOTE — Progress Notes (Signed)
eLink Physician-Brief Progress Note Patient Name: [name] DOB: [birth year] MRN: [id number]  Date of Service  08/24/2011   HPI/Events of Note  Hypercarbia noted on vent  eICU Interventions  RR increased on vent   Intervention Category Major Interventions: Respiratory failure - evaluation and management  Shan Levans 08/24/2011, 5:30 PM

## 2011-08-24 NOTE — Progress Notes (Signed)
*  PRELIMINARY RESULTS*   Bilateral lower extremity venous Dopplers completed.  There is no deep or superficial vein thrombosis noted in the bilateral lower extremities.      Sherren Kerns Renee 08/24/2011, 5:32 PM

## 2011-08-24 NOTE — H&P (Signed)
[name] is an 65 y.o. female.    Chief Complaint: Asthma/COPD exacerbation, Afib with RVR  HP 12/28I: Pt is a 65 y/o F with PMHx of moderat persistent asthma, Hx of tobacco abuse, HTN, DL, anxiety who came to the ER due to worsening of SOB, cough, sputum production for the alst 2 days, and was found to have a fib with RVR.  As per pt and medical records, she started having body aches, sore throat, runny nose and cough with yellowish sputum production about 2 days ago. Pt also noticed fevers of 102 at home today and yesterday and progressive SOB and called EMS today. Pt was given albuterol and was found to have afib with RVR with HR in the 200's. Pt was given Adenosine x2 with no response and then was started on cardizem bolus + drip. She denies any chest pain, hemoptysis or syncopal episodes. She reports that her asthma has been under well control on Advair 250/50 with the last asthma exacrebation about 1 year ago. She has never been on mech ventilation and has been using albuterol moreoften for the last 2 days. 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   DEVICES ETT 12/30 >>  CULTURES FLU A 12/29 PCR -  POSITIVE  Results for orders placed during the hospital encounter of 08/22/11  CULTURE, RESPIRATORY     Status: Normal (Preliminary result)   Collection Time   08/23/11 12:22 AM      Component Value Range Status Comment   Specimen Description SPUTUM   Final    Special Requests Normal   Final    Gram Stain     Final    Value: RARE WBC PRESENT, PREDOMINANTLY PMN     NO SQUAMOUS EPITHELIAL CELLS SEEN     RARE GRAM POSITIVE COCCI     IN PAIRS RARE GRAM NEGATIVE RODS   Culture PENDING   Incomplete    Report Status PENDING   Incomplete   MRSA PCR SCREENING     Status: Normal   Collection Time   08/23/11  3:44 AM      Component Value Range Status Comment   MRSA by PCR NEGATIVE  NEGATIVE  Final        ABX Anti-infectives     Start      Dose/Rate Route Frequency Ordered Stop   08/23/11 1000   oseltamivir (TAMIFLU) capsule 75 mg        75 mg Oral 2 times daily 08/23/11 0810 08/28/11 0959   08/23/11 0015   cefTRIAXone (ROCEPHIN) 1 g in dextrose 5 % 50 mL IVPB        1 g 100 mL/hr over 30 Minutes Intravenous Daily at bedtime 08/23/11 0000     08/23/11 0015   azithromycin (ZITHROMAX) tablet 500 mg        500 mg Oral Daily 08/23/11 0001           TAMIFLU 12/29 (flu a) >>  [facility] Lovenox Rx Dose Protonix    OVERNIGHT Worseing resp distress and fatigue. Intolerant to bipap last night and this pm -> intubated   ROS   all other systems were negative except as above in HPI.  Blood pressure 154/90, pulse 92, temperature 97.7 F (36.5 C), temperature source Oral, resp. rate 20, height 5\' 2"  (1.575 m), weight 77 kg (169 lb 12.1 oz), SpO2 99.00%.   Physical Exam Pt is alert, oriented x3 in no  acute resp distress HEENT: no JVD, no oral trush or postnasal drip. Looks cushingoid CV: irregular rhythm, S1 and S2 normal Lungs: extreme respiratory distress, acc muscle use, inability to speak sentences Abdomen: soft, non tender, non distended. BS + No peritoneal signs. Extrem: no low extrem edema, no calf tenderness. Neurol: no focal deficit.   Dg Chest Port 1 View  08/24/2011  *RADIOLOGY REPORT*  Clinical Data: Intubation.  PORTABLE CHEST - 1 VIEW 08/24/2011 1610 hours:  Comparison: Portable chest x-ray earlier same date 1351 hours and dating back to 08/22/2011.  Findings: Endotracheal tube tip in satisfactory position approximately 4 cm above the carina.  No evidence of pneumothorax or pneumomediastinum.  Cardiac silhouette upper normal in size to slightly enlarged for the AP portable technique, unchanged. Prominent bronchovascular markings diffusely, unchanged.  No new pulmonary parenchymal abnormality.  IMPRESSION:  1.  Endotracheal tube tip in satisfactory position approximately 4 cm above the carina. 2.  Stable  bronchitis/asthma.  No new/acute cardiopulmonary disease.  Original Report Authenticated By: Arnell Sieving, M.D.   Dg Chest Port 1 View  08/24/2011  *RADIOLOGY REPORT*  Clinical Data: Cough, respiratory distress  PORTABLE CHEST - 1 VIEW  Comparison: 08/23/2011  Findings: Cardiomediastinal silhouette is stable.  There is slight worsening interstitial prominence bilaterally suspicious for early edema or pneumonitis.  No focal infiltrate.  IMPRESSION: Slight worsening interstitial prominence bilaterally suspicious for early pulmonary edema or pneumonitis.  No focal infiltrate.  Original Report Authenticated By: Natasha Mead, M.D.   Dg Chest Port 1 View  08/23/2011  *RADIOLOGY REPORT*  Clinical Data: Cough and shortness of breath.  PORTABLE CHEST - 1 VIEW  Comparison: 08/22/2011 and 12/26/2009  Findings: The interstitial accentuation seen on the prior study has improved.  There is residual peribronchial thickening.  The lungs are somewhat hyperinflated suggesting emphysema.  Heart size is normal.  Pulmonary vascularity is slightly prominent.  IMPRESSION: Interval almost complete resolution of interstitial accentuation suggesting that the finding on the previous study was pulmonary edema.  Original Report Authenticated By: Gwynn Burly, M.D.   Dg Chest Port 1 View  08/22/2011  *RADIOLOGY REPORT*  Clinical Data: Shortness of breath, asthma  PORTABLE CHEST - 1 VIEW  Comparison: 12/26/2009  Findings: Cardiomediastinal silhouette is stable.  There is worsening perihilar and infrahilar bronchitic changes and mild interstitial prominence.  This may be due to bronchitis or early pneumonitis.  Clinical correlation is necessary.  No focal infiltrate or pulmonary edema.  IMPRESSION:  There is worsening perihilar and infrahilar bronchitic changes and mild interstitial prominence.  This may be due to bronchitis or early pneumonitis.  Clinical correlation is necessary.  No focal infiltrate or pulmonary edema.   Original Report Authenticated By: Natasha Mead, M.D.   LABS  BMET    Component Value Date/Time   NA 134* 08/24/2011 1650   K 4.3 08/24/2011 1650   CL 97 08/24/2011 1650   CO2 27 08/24/2011 1650   GLUCOSE 179* 08/24/2011 1650   BUN 18 08/24/2011 1650   CREATININE 0.36* 08/24/2011 1650   CALCIUM 8.8 08/24/2011 1650   GFRNONAA >90 08/24/2011 1650   GFRAA >90 08/24/2011 1650    CBC    Component Value Date/Time   WBC 14.6* 08/24/2011 1650   RBC 4.44 08/24/2011 1650   HGB 13.6 08/24/2011 1650   HCT 40.3 08/24/2011 1650   PLT 266 08/24/2011 1650   MCV 90.8 08/24/2011 1650   MCH 30.6 08/24/2011 1650   MCHC 33.7 08/24/2011 1650   RDW  13.1 08/24/2011 1650   LYMPHSABS 0.6* 08/24/2011 1650   MONOABS 1.2* 08/24/2011 1650   EOSABS 0.0 08/24/2011 1650   BASOSABS 0.0 08/24/2011 1650    ABG    Component Value Date/Time   PHART 7.242* 08/24/2011 1550   PCO2ART 69.1* 08/24/2011 1550   PO2ART 256.0* 08/24/2011 1550   HCO3 28.7* 08/24/2011 1550   TCO2 26.6 08/24/2011 1550   ACIDBASEDEF 0.8 08/23/2011 1600   O2SAT 99.5 08/24/2011 1550       Assessment/Plan Pt is a 65 y/o F with PMHx of moderat persistent asthma, Hx of tobacco abuse, HTN, DL, anxiety who came to the ER due to worsening of SOB, cough, sputum production for the last 2 days, and was found to have a fib with RVR.  1. Asthma/COPD excerbation/ACute REsp Failure due to FLU A - Pt has Hx of moder persistent asthma, and Hx of tobacco abuse (15 p/yHx) - Failed conventional medical Rx PLAN  - Intubated, - Permissive hypercapnia (RR 10-12 andpH > 7.2 is goal) - Deep sedation and if has ventilator dyssynchrony paralyzed the patient - Continue nebulizers and corticosteroids and antibiotics and Tamiflu   2. A fib with RVR - [place] due to acute resp infection/asthma exacerbation - TSH pending - Started on cardizem, HR now 120's, BP stable PLAN - Cardiology on consult. - Enoxaparin Rx dose - TTE pending (reordered  12/30) - Cont Cardizem  3. HTN - BP stable - On cardizem drip for now  4. DVT prophylaxis - Anticoag with Lovenox for now  5. [employer]  Is a boyfriend of 8 years. Her daughter is in [place] at the moment she is 65 years old. She has asked that her daughter make decisions for her in the event she is unable to. Boyfriend has been updated to the above events at the bedside    [facility] 08/24/2011, 6:14 PM The patient is critically ill with multiple organ systems failure and requires high complexity decision making for assessment and support, frequent evaluation and titration of therapies, application of advanced monitoring technologies and extensive interpretation of multiple databases.   Critical Care Time devoted to patient care services described in this note is  45 minutes independent of procedure time

## 2011-08-24 NOTE — Consult Note (Signed)
[name]  65 y.o.  female  Subjective: Dyspnea somewhat improved; no palpitations nor chest discomfort.   Allergy: Codeine  Objective: Vital signs in last 24 hours: Temp:  [97 F (36.1 C)-97.6 F (36.4 C)] 97 F (36.1 C) (12/30 0400) Pulse Rate:  [90-106] 104  (12/30 0600) Resp:  [21-36] 29  (12/30 0600) BP: (106-183)/(67-88) 142/77 mmHg (12/30 0600) SpO2:  [96 %-100 %] 99 % (12/30 0600) FiO2 (%):  [50 %] 50 % (12/30 0600) Weight:  [77 kg (169 lb 12.1 oz)] 169 lb 12.1 oz (77 kg) (12/30 0500)  169 lb 12.1 oz (77 kg) Body mass index is 31.05 kg/(m^2).  Weight change: 3.517 kg (7 lb 12.1 oz) Last BM Date: 08/22/11  Intake/Output from previous day: 12/29 0701 - 12/30 0700 In: -  Out: 1425 [Urine:1425]  General- Well developed; mild to moderate respiratory distress, somewhat improved  Neck- No JVD, no carotid bruits Lungs-prolonged exp phase; exp. wheeze Cardiovascular- normal PMI; distant S1 and S2; irreg Abdomen- normal bowel sounds; soft and non-tender without masses or organomegaly Skin- Warm, no significant lesions Extremities- Nl distal pulses; no edema  CBC:    Basename 08/23/11 0805 08/22/11 1853 08/22/11 1825  WBC 12.0* -- 18.3*  HGB 13.7 15.6* --  HCT 40.0 46.0 --  PLT 202 -- 216   BMET:   Basename 08/23/11 0805 08/22/11 1853  NA 129* 126*  K 3.7 3.9  CL 94* 97  CO2 23 --  GLUCOSE 178* 181*  BUN 15 18  CREATININE 0.31* 0.60  CALCIUM 8.9 --   GFR:  Estimated Creatinine Clearance: 69.2 ml/min (by C-G formula based on Cr of 0.31).  Imaging Studies/Results: Dg Chest Port 1 View  08/23/2011  *RADIOLOGY REPORT*  Clinical Data: Cough and shortness of breath.  PORTABLE CHEST - 1 VIEW  Comparison: 08/22/2011 and 12/26/2009  Findings: The interstitial accentuation seen on the prior study has improved.  There is residual peribronchial thickening.  The lungs are somewhat hyperinflated suggesting emphysema.  Heart size is normal.  Pulmonary vascularity is  slightly prominent.  IMPRESSION: Interval almost complete resolution of interstitial accentuation suggesting that the finding on the previous study was pulmonary edema.  Original Report Authenticated By: Gwynn Burly, M.D.   Dg Chest Port 1 View  08/22/2011  *RADIOLOGY REPORT*  Clinical Data: Shortness of breath, asthma  PORTABLE CHEST - 1 VIEW  Comparison: 12/26/2009  Findings: Cardiomediastinal silhouette is stable.  There is worsening perihilar and infrahilar bronchitic changes and mild interstitial prominence.  This may be due to bronchitis or early pneumonitis.  Clinical correlation is necessary.  No focal infiltrate or pulmonary edema.  IMPRESSION:  There is worsening perihilar and infrahilar bronchitic changes and mild interstitial prominence.  This may be due to bronchitis or early pneumonitis.  Clinical correlation is necessary.  No focal infiltrate or pulmonary edema.  Original Report Authenticated By: Natasha Mead, M.D.    Imaging: Imaging results have been reviewed  Medications: I have reviewed the patient's current medications.  Infusions:      . sodium chloride 125 mL/hr at 08/24/11 0600  . diltiazem (CARDIZEM) infusion 10 mg/hr (08/24/11 0600)    Active Problems:  * No active hospital problems. *    Assessment/Plan: AF: Heart rate control somewhat suboptimal with IV diltiazem->increase dose.  On full dose lovenox, but risk of thromboembolism relatively low.  Echo-normal LV systolic function; no significant valvular abnormalities. Normal TSH.  Hypertension: intermittent mild elevations of BP.   Blood pressure  control improved with transdermal clonidine without adverse effects.  Increased dose if blood pressure remains elevated significantly over the next 24 hours.  Modest diuresis with improvement in pulmonary edema.  Additional furosemide can be ordered as needed.  Hyponatremia-improved.  Influenza A-appropriate therapy has been ordered.   LOS: 2 days   Veyo Bing 08/24/2011, 7:22 AM

## 2011-08-25 ENCOUNTER — Inpatient Hospital Stay (HOSPITAL_COMMUNITY): Payer: 59

## 2011-08-25 LAB — BLOOD GAS, ARTERIAL
Acid-Base Excess: 2.9 mmol/L — ABNORMAL HIGH (ref 0.0–2.0)
Bicarbonate: 29.8 mEq/L — ABNORMAL HIGH (ref 20.0–24.0)
FIO2: 0.3 %
O2 Saturation: 96.4 %
PEEP: 5 cmH2O
Patient temperature: 98.6
RATE: 12 resp/min
pO2, Arterial: 80.3 mmHg (ref 80.0–100.0)

## 2011-08-25 LAB — CULTURE, RESPIRATORY W GRAM STAIN: Special Requests: NORMAL

## 2011-08-25 LAB — BASIC METABOLIC PANEL
CO2: 29 mEq/L (ref 19–32)
Calcium: 9 mg/dL (ref 8.4–10.5)
GFR calc non Af Amer: >90 mL/min (ref >90)
Glucose, Bld: 170 mg/dL — ABNORMAL HIGH (ref 70–99)
Potassium: 4.3 mEq/L (ref 3.5–5.1)
Sodium: 133 mEq/L — ABNORMAL LOW (ref 135–145)

## 2011-08-25 LAB — PHOSPHORUS: Phosphorus: 2.9 mg/dL (ref 2.3–4.6)

## 2011-08-25 LAB — PROCALCITONIN: Procalcitonin: 0.38 ng/mL

## 2011-08-25 LAB — MAGNESIUM: Magnesium: 2.6 mg/dL — ABNORMAL HIGH (ref 1.5–2.5)

## 2011-08-25 MED ORDER — FUROSEMIDE 10 MG/ML IJ SOLN
40.0000 mg | Freq: Once | INTRAMUSCULAR | Status: AC
  Administered 2011-08-25: 40 mg via INTRAVENOUS
  Filled 2011-08-25: qty 4

## 2011-08-25 MED ORDER — PANTOPRAZOLE SODIUM 40 MG PO PACK
40.0000 mg | PACK | Freq: Every day | ORAL | Status: DC
  Administered 2011-08-25 – 2011-08-27 (×3): 40 mg
  Filled 2011-08-25 (×8): qty 20

## 2011-08-25 MED ORDER — OXEPA PO LIQD
1000.0000 mL | ORAL | Status: DC
  Administered 2011-08-25 – 2011-08-27 (×3): 1000 mL
  Filled 2011-08-25 (×4): qty 1000

## 2011-08-25 MED ORDER — PRO-STAT SUGAR FREE PO LIQD
30.0000 mL | Freq: Three times a day (TID) | ORAL | Status: DC
  Administered 2011-08-25 – 2011-08-27 (×7): 30 mL via ORAL
  Filled 2011-08-25 (×13): qty 30

## 2011-08-25 MED ORDER — JEVITY 1.2 CAL PO LIQD: 1000.0000 mL | ORAL | Status: DC

## 2011-08-25 NOTE — Progress Notes (Signed)
INITIAL ADULT NUTRITION ASSESSMENT Date: 08/25/2011   Time: 10:38 AM Reason for Assessment: vent  ASSESSMENT: Female 65 y.o.  Dx: shortness of breath.  Hx:  Past Medical History  Diagnosis Date  . Moderate asthma   . Hypertension   . Hypercholesteremia   . Lumbar back pain   . Anxiety    Past Surgical History  Procedure Date  . Tonsillectomy and adnoidectomy   . Cholecystectomy   . Left thumb cmc arthroplasty 11/2010    by DrGramig    Related Meds:  Scheduled Meds:   . antiseptic oral rinse  15 mL Mouth Rinse q12n4p  . azithromycin  500 mg Oral Daily  . cefTRIAXone (ROCEPHIN)  IV  1 g Intravenous QHS  . chlorhexidine  15 mL Mouth Rinse BID  . enoxaparin (LOVENOX) injection  70 mg Subcutaneous Q12H  . etomidate      . etomidate  20 mg Intravenous Once  . fentaNYL  100 mcg Intravenous Once  . ipratropium  0.5 mg Nebulization Q3H  . levalbuterol  0.63 mg Nebulization Q3H  . lidocaine (cardiac) 100 mg/40ml      . methylPREDNISolone (SOLU-MEDROL) injection  60 mg Intravenous Q8H  . midazolam      . midazolam  4 mg Intravenous Once  .  morphine injection  2 mg Intravenous Once  . oseltamivir  75 mg Oral BID  . pantoprazole  40 mg Oral Q1200  . rocuronium      . rocuronium  70 mg Intravenous Once  . succinylcholine      . DISCONTD: cloNIDine  0.1 mg Transdermal Weekly   Continuous Infusions:   . diltiazem (CARDIZEM) infusion 15 mg/hr (08/25/11 0800)  . fentaNYL infusion INTRAVENOUS 75 mcg/hr (08/25/11 0800)  . midazolam (VERSED) infusion 3 mg/hr (08/25/11 0800)  . DISCONTD: sodium chloride 125 mL/hr at 08/24/11 0600   PRN Meds:.sodium chloride, albuterol, fentaNYL, midazolam   Ht: 5\' 2"  (157.5 cm)  Wt: 160 lb 11.5 oz (72.9 kg)  Ideal Wt: 50 kg % Ideal Wt: 145%  Usual Wt: unknown % Usual Wt:   Body mass index is 29.40 kg/(m^2).  Food/Nutrition Related Hx:   Pt admitted with shortness of breath after two days of worsening cough and sputum production.  Nutrition hx PTA unknown.  No family at bedside.   Intubated with OGT. MVe:  6.3 L/min Tm:  36.5C  Labs:  CMP     Component Value Date/Time   NA 133* 08/25/2011 0345   K 4.3 08/25/2011 0345   CL 97 08/25/2011 0345   CO2 29 08/25/2011 0345   GLUCOSE 170* 08/25/2011 0345   BUN 27* 08/25/2011 0345   CREATININE 0.58 08/25/2011 0345   CALCIUM 9.0 08/25/2011 0345   PROT 7.1 12/11/2010 1510   ALBUMIN 3.9 12/11/2010 1510   AST 33 12/11/2010 1510   ALT 40* 12/11/2010 1510   ALKPHOS 103 12/11/2010 1510   BILITOT 0.7 12/11/2010 1510   GFRNONAA >90 08/25/2011 0345   GFRAA >90 08/25/2011 0345    CBC    Component Value Date/Time   WBC 14.6* 08/24/2011 1650   RBC 4.44 08/24/2011 1650   HGB 13.6 08/24/2011 1650   HCT 40.3 08/24/2011 1650   PLT 266 08/24/2011 1650   MCV 90.8 08/24/2011 1650   MCH 30.6 08/24/2011 1650   MCHC 33.7 08/24/2011 1650   RDW 13.1 08/24/2011 1650   LYMPHSABS 0.6* 08/24/2011 1650   MONOABS 1.2* 08/24/2011 1650   EOSABS 0.0 08/24/2011 1650   BASOSABS  0.0 08/24/2011 1650    Intake:  NPO Output:    Intake/Output Summary (Last 24 hours) at 08/25/11 1044 Last data filed at 08/25/11 0800  Gross per 24 hour  Intake 661.15 ml  Output    680 ml  Net -18.85 ml   Diet Order: NPO  Supplements/Tube Feeding: none at this time  IVF:    diltiazem (CARDIZEM) infusion Last Rate: 15 mg/hr (08/25/11 0800)  fentaNYL infusion INTRAVENOUS Last Rate: 75 mcg/hr (08/25/11 0800)  midazolam (VERSED) infusion Last Rate: 3 mg/hr (08/25/11 0800)  DISCONTD: sodium chloride Last Rate: 125 mL/hr at 08/24/11 0600    Estimated Nutritional Needs:   Kcal: 1195-1320 kcal Protein: 87-102 g Fluid: ~2.2 L/day  NUTRITION DIAGNOSIS: -Inadequate oral intake (NI-2.1).  Status: Ongoing  RELATED TO: mechanical ventilation  AS EVIDENCE BY: pt intubated, NPO  MONITORING/EVALUATION(Goals): 1.  Enteral nutrition; If pt to remain intubated >48 hrs, recommend initiation of enteral feeds.      EDUCATION NEEDS: -Education not appropriate at this time  INTERVENTION: 1.  Recommend Jevity 1.2 @ 20 mL/hr continuous. Advance by 10 mL/hr to 40 mL/hr goal. 2.  Supplements; Prostat TID  Total nutrition to be provided:  1368 kcal, 98g protein, 787 mL free water.  Dietitian #: [phone]  DOCUMENTATION CODES Per approved criteria  -Not Applicable    Loyce Dys [facility] 08/25/2011, 10:38 AM

## 2011-08-25 NOTE — H&P (Signed)
[name] is an 65 y.o. female.    Chief Complaint: Asthma/COPD exacerbation, Afib with RVR  HPI: Pt is [name] with PMHx of moderat persistent asthma, Hx of tobacco abuse, HTN, DL, anxiety who came to the ER due to worsening of SOB, cough, sputum production for the alst 2 days, and was found to have [name] fib with RVR after nebs  [name] asthma has been under well control on Advair 250/50 with the last asthma exacrebation about 1 year ago. 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  Evaln pos for flu [name] & H. Flu, required cardizem drip & mechanical ventilation  DEVICES ETT 12/30 >>  CULTURES FLU [name] 12/29 PCR -  POSITIVE resp cx 12/29 >> h. flu  TAMIFLU 12/29 (flu [name]) >>  [facility] Lovenox Rx Dose Protonix    OVERNIGHT Sedated on vent , not weanable, air traps.    Blood pressure 100/53, pulse 82, temperature 97.7 [name] (36.5 C), temperature source Oral, resp. rate 12, height 5\' 2"  (1.575 m), weight 160 lb 11.5 oz (72.9 kg), SpO2 95.00%.   Physical Exam Pt is sedated on vent HEENT: no JVD, no oral trush or postnasal drip. Looks cushingoid. ET-> vent CV: irregular rhythm, S1 and S2 normal Lungs: decreased air movement Abdomen: soft, non tender, non distended. BS + No peritoneal signs. Extrem: no low extrem edema, no calf tenderness. Neurol: sedated   Dg Chest Port 1 View  08/25/2011  *RADIOLOGY REPORT*  Clinical Data: Respiratory failure.  Ventilator patient.  PORTABLE CHEST - 1 VIEW  Comparison: 08/24/2011.  Findings: 0445 hours.  The endotracheal tube tip is lower, 2.1 cm above the carina.  [name] nasogastric tube has been placed, projecting below the diaphragm.  There are lower lung volumes with mildly increased bibasilar atelectasis.  Diffuse interstitial prominence appears unchanged.  No pneumothorax or significant pleural effusion is identified.  The heart size and mediastinal contours are stable.  IMPRESSION: Lower lung volumes with mildly increased bibasilar  atelectasis.  No other significant changes.  Original Report Authenticated By: Gerrianne Scale, M.D.   Dg Chest Port 1 View  08/24/2011  *RADIOLOGY REPORT*  Clinical Data: Intubation.  PORTABLE CHEST - 1 VIEW 08/24/2011 1610 hours:  Comparison: Portable chest x-ray earlier same date 1351 hours and dating back to 08/22/2011.  Findings: Endotracheal tube tip in satisfactory position approximately 4 cm above the carina.  No evidence of pneumothorax or pneumomediastinum.  Cardiac silhouette upper normal in size to slightly enlarged for the AP portable technique, unchanged. Prominent bronchovascular markings diffusely, unchanged.  No new pulmonary parenchymal abnormality.  IMPRESSION:  1.  Endotracheal tube tip in satisfactory position approximately 4 cm above the carina. 2.  Stable bronchitis/asthma.  No new/acute cardiopulmonary disease.  Original Report Authenticated By: Arnell Sieving, M.D.   Dg Chest Port 1 View  08/24/2011  *RADIOLOGY REPORT*  Clinical Data: Cough, respiratory distress  PORTABLE CHEST - 1 VIEW  Comparison: 08/23/2011  Findings: Cardiomediastinal silhouette is stable.  There is slight worsening interstitial prominence bilaterally suspicious for early edema or pneumonitis.  No focal infiltrate.  IMPRESSION: Slight worsening interstitial prominence bilaterally suspicious for early pulmonary edema or pneumonitis.  No focal infiltrate. LABS  BMET    Component Value Date/Time   NA 133* 08/25/2011 0345   K 4.3 08/25/2011 0345   CL 97 08/25/2011 0345   CO2 29 08/25/2011 0345   GLUCOSE 170* 08/25/2011 0345   BUN 27* 08/25/2011 0345  CREATININE 0.58 08/25/2011 0345   CALCIUM 9.0 08/25/2011 0345   GFRNONAA >90 08/25/2011 0345   GFRAA >90 08/25/2011 0345    CBC    Component Value Date/Time   WBC 14.6* 08/24/2011 1650   RBC 4.44 08/24/2011 1650   HGB 13.6 08/24/2011 1650   HCT 40.3 08/24/2011 1650   PLT 266 08/24/2011 1650   MCV 90.8 08/24/2011 1650   MCH 30.6 08/24/2011  1650   MCHC 33.7 08/24/2011 1650   RDW 13.1 08/24/2011 1650   LYMPHSABS 0.6* 08/24/2011 1650   MONOABS 1.2* 08/24/2011 1650   EOSABS 0.0 08/24/2011 1650   BASOSABS 0.0 08/24/2011 1650    ABG    Component Value Date/Time   PHART 7.318* 08/25/2011 0433   PCO2ART 59.8* 08/25/2011 0433   PO2ART 80.3 08/25/2011 0433   HCO3 29.8* 08/25/2011 0433   TCO2 27.5 08/25/2011 0433   ACIDBASEDEF 0.8 08/23/2011 1600   O2SAT 96.4 08/25/2011 0433       Assessment/Plan Pt is [name] with PMHx of moderate persistent asthma, Hx of tobacco abuse, HTN, DL, anxiety with flu [name] followed by Hemophilus bacterial pneumona & fib with RVR.  1. Asthma/COPD excerbation/ACute REsp Failure due to FLU [name] - Pt has Hx of moderate to persistent asthma, and Hx of tobacco abuse (15 p/yHx) - Failed conventional medical Rx PLAN  - Intubated, - Permissive hypercapnia (RR 10-12 andpH > 7.2 is goal) - Continue nebulizers and corticosteroids and antibiotics and Tamiflu -no wean 12/31, reevaluate daily - hope to start wean as bspasm resolves in 24 h   2. [name] fib with RVR - Lilkely due to acute resp infection/asthma exacerbation - TSH pending - Started on cardizem, controlled rate BP stable PLAN - Cardiology  Consulted. - Enoxaparin Rx dose - TTE pending (reordered 12/30) - Cont Cardizem per cards >> change to PO??  3. HTN - BP stable - On cardizem drip for now  4. DVT prophylaxis - Anticoag with Lovenox for now  5. Global  Is [name] boyfriend of 8 years. [name] is in [place] at the moment she is 65 years old. She has asked that [name] make decisions for [name] in the event she is unable to. Boyfriend has been updated to the above events at the bedside    [facility] Minor ACNP [facility] PCCM Pager [phone] till 3 pm If no answer page [phone] 08/25/2011, 9:14 AM  Examined pt with NP steve Minor & agree with above plan, ABGs improving with permissive hypercarbia, bspasm slowly resolving  Kasondra Junod  V. Care during the described time interval was provided by me and/or other providers on the critical care team.  I have reviewed this patient's available data, including medical history, events of note, physical examination and test results as part of my evaluation  CC time x 21m

## 2011-08-25 NOTE — Progress Notes (Signed)
[name]  65 y.o.  female  Subjective: Patient intubated last night. Now sedated on vent.   Allergy: Codeine  Objective: Vital signs in last 24 hours: Temp:  [96.1 F (35.6 C)-97.7 F (36.5 C)] 97.4 F (36.3 C) (12/31 0400) Pulse Rate:  [77-118] 78  (12/31 0400) Resp:  [11-33] 12  (12/31 0400) BP: (87-176)/(55-90) 87/55 mmHg (12/31 0400) SpO2:  [94 %-100 %] 94 % (12/31 0400) FiO2 (%):  [30 %-70 %] 30 % (12/31 0600) Weight:  [72.9 kg (160 lb 11.5 oz)] 160 lb 11.5 oz (72.9 kg) (12/31 0100)  160 lb 11.5 oz (72.9 kg) Body mass index is 29.40 kg/(m^2).  Weight change: -4.1 kg (-9 lb 0.6 oz) Last BM Date: 08/22/11  Intake/Output from previous day: 12/30 0701 - 12/31 0700 In: 671 [I.V.:671] Out: 650 [Urine:650]  General- Well developed;appears comfortable on vent. Neck- No JVD, no carotid bruits Lungs-prolonged exp phase; mild exp. wheeze Cardiovascular- normal PMI; distant S1 and S2; irreg Abdomen- normal bowel sounds; soft and non-tender without masses or organomegaly Skin- Warm, no significant lesions Extremities- Nl distal pulses; no edema  CBC:    Basename 08/24/11 1650 08/24/11 1405  WBC 14.6* 13.9*  HGB 13.6 13.4  HCT 40.3 39.6  PLT 266 257   BMET:   Basename 08/25/11 0345 08/24/11 1650  NA 133* 134*  K 4.3 4.3  CL 97 97  CO2 29 27  GLUCOSE 170* 179*  BUN 27* 18  CREATININE 0.58 0.36*  CALCIUM 9.0 8.8   GFR:  Estimated Creatinine Clearance: 67.3 ml/min (by C-G formula based on Cr of 0.58).  Telemetry: Afib with controlled rate.  Imaging Studies/Results:  Imaging results have been reviewed. CXR shows stable bronchitic changes. ET tube in good position.  Medications: I have reviewed the patient's current medications.  Infusions:      . diltiazem (CARDIZEM) infusion 15 mg/hr (08/25/11 0443)  . fentaNYL infusion INTRAVENOUS 75 mcg/hr (08/24/11 2100)  . midazolam (VERSED) infusion 3 mg/hr (08/25/11 0600)  . DISCONTD: sodium chloride 125 mL/hr  at 08/24/11 0600  . DISCONTD: diltiazem (CARDIZEM) infusion 10 mg/hr (08/24/11 0700)    Active Problems:  * No active hospital problems. *    Assessment/Plan: AF: Heart rate well controlled on diltiazem at 15 mg/hr. On full dose lovenox, but risk of thromboembolism relatively low.  Echo-normal LV systolic function; no significant valvular abnormalities. Normal TSH.  Hypertension: Now hypotensive since sedated on vent. Will discontinue clonidine patch.  Modest diuresis with improvement in pulmonary edema.  Additional furosemide can be ordered as needed.  Hyponatremia-improved.  Influenza A-appropriate therapy has been ordered.   LOS: 3 days   Peter [place] 08/25/2011, 7:09 AM

## 2011-08-25 NOTE — Progress Notes (Signed)
Nutrition Brief Note:  Order received to initiate TFs.  TFs to be started as recommended as pt condition and needs have not changed.   RD to follow.  Pager: [phone]

## 2011-08-25 NOTE — Progress Notes (Signed)
eLink Physician-Brief Progress Note Patient Name: [name] DOB: [birth year] MRN: [id number]  Date of Service  08/25/2011   HPI/Events of Note  oliguric  eICU Interventions  See lasix order    Intervention Category Intermediate Interventions: Oliguria - evaluation and management  Shan Levans 08/25/2011, 6:43 PM

## 2011-08-26 ENCOUNTER — Inpatient Hospital Stay (HOSPITAL_COMMUNITY): Payer: 59

## 2011-08-26 DIAGNOSIS — J9600 Acute respiratory failure, unspecified whether with hypoxia or hypercapnia: Secondary | ICD-10-CM

## 2011-08-26 DIAGNOSIS — J45902 Unspecified asthma with status asthmaticus: Secondary | ICD-10-CM

## 2011-08-26 DIAGNOSIS — I4891 Unspecified atrial fibrillation: Secondary | ICD-10-CM

## 2011-08-26 DIAGNOSIS — J111 Influenza due to unidentified influenza virus with other respiratory manifestations: Secondary | ICD-10-CM

## 2011-08-26 LAB — BLOOD GAS, ARTERIAL
Acid-Base Excess: 2.6 mmol/L — ABNORMAL HIGH (ref 0.0–2.0)
FIO2: 0.4 %
TCO2: 25.8 mmol/L (ref 0–100)
pCO2 arterial: 51.2 mmHg — ABNORMAL HIGH (ref 35.0–45.0)
pH, Arterial: 7.361 (ref 7.350–7.400)
pO2, Arterial: 133 mmHg — ABNORMAL HIGH (ref 80.0–100.0)

## 2011-08-26 LAB — CBC
HCT: 35.9 % — ABNORMAL LOW (ref 36.0–46.0)
Hemoglobin: 11.7 g/dL — ABNORMAL LOW (ref 12.0–15.0)
MCH: 30.2 pg (ref 26.0–34.0)
MCV: 92.5 fL (ref 78.0–100.0)
RBC: 3.88 MIL/uL (ref 3.87–5.11)

## 2011-08-26 LAB — GLUCOSE, CAPILLARY
Glucose-Capillary: 304 mg/dL — ABNORMAL HIGH (ref 70–99)
Glucose-Capillary: 263 mg/dL — ABNORMAL HIGH (ref 70–99)
Glucose-Capillary: 208 mg/dL — ABNORMAL HIGH (ref 70–99)

## 2011-08-26 LAB — BASIC METABOLIC PANEL
BUN: 53 mg/dL — ABNORMAL HIGH (ref 6–23)
CO2: 32 mEq/L (ref 19–32)
Calcium: 8.9 mg/dL (ref 8.4–10.5)
Glucose, Bld: 224 mg/dL — ABNORMAL HIGH (ref 70–99)
Sodium: 134 mEq/L — ABNORMAL LOW (ref 135–145)

## 2011-08-26 LAB — MAGNESIUM: Magnesium: 3 mg/dL — ABNORMAL HIGH (ref 1.5–2.5)

## 2011-08-26 MED ORDER — INSULIN ASPART 100 UNIT/ML ~~LOC~~ SOLN
0.0000 [IU] | SUBCUTANEOUS | Status: DC
  Administered 2011-08-26: 7 [IU] via SUBCUTANEOUS
  Administered 2011-08-26: 11 [IU] via SUBCUTANEOUS
  Administered 2011-08-26: 15 [IU] via SUBCUTANEOUS
  Administered 2011-08-27 (×2): 7 [IU] via SUBCUTANEOUS
  Administered 2011-08-27: 4 [IU] via SUBCUTANEOUS
  Administered 2011-08-27 – 2011-08-28 (×4): 7 [IU] via SUBCUTANEOUS
  Administered 2011-08-28: 3 [IU] via SUBCUTANEOUS
  Administered 2011-08-28 (×2): 4 [IU] via SUBCUTANEOUS
  Administered 2011-08-28: 3 [IU] via SUBCUTANEOUS
  Administered 2011-08-28: 4 [IU] via SUBCUTANEOUS
  Administered 2011-08-29: 7 [IU] via SUBCUTANEOUS
  Administered 2011-08-29: 4 [IU] via SUBCUTANEOUS
  Administered 2011-08-29 (×3): 3 [IU] via SUBCUTANEOUS
  Administered 2011-08-30: 4 [IU] via SUBCUTANEOUS
  Filled 2011-08-26: qty 3

## 2011-08-26 MED ORDER — INSULIN GLARGINE 100 UNIT/ML ~~LOC~~ SOLN
10.0000 [IU] | Freq: Every day | SUBCUTANEOUS | Status: DC
  Administered 2011-08-26 – 2011-08-27 (×2): 10 [IU] via SUBCUTANEOUS
  Filled 2011-08-26: qty 3

## 2011-08-26 MED ORDER — SODIUM CHLORIDE 0.9 % IV SOLN
250.0000 mL | INTRAVENOUS | Status: DC | PRN
  Administered 2011-08-27: 250 mL via INTRAVENOUS

## 2011-08-26 MED ORDER — DILTIAZEM HCL 90 MG PO TABS
90.0000 mg | ORAL_TABLET | Freq: Four times a day (QID) | ORAL | Status: DC
  Administered 2011-08-26 – 2011-09-02 (×29): 90 mg via ORAL
  Filled 2011-08-26 (×33): qty 1

## 2011-08-26 NOTE — Progress Notes (Signed)
Inpatient Diabetes Program Recommendations  AACE/ADA: New Consensus Statement on Inpatient Glycemic Control (2009)  Target Ranges:  Prepandial:   less than 140 mg/dL      Peak postprandial:   less than 180 mg/dL (1-2 hours)      Critically ill patients:  140 - 180 mg/dL   Reason for Visit: Hyperglycemia  Inpatient Diabetes Program Recommendations Insulin - Meal Coverage: Add Novolog 3 units Q4 hours for TF coverage HgbA1C: No hx of DM - HgbA1C to assess glycemic control at home  Note: CBGs 267, 304, on Oxepa 40/hour

## 2011-08-26 NOTE — Progress Notes (Addendum)
[name] is an 66 y.o. female.    Chief Complaint: Asthma/COPD exacerbation, Afib with RVR  HPI: Pt is a 66 y/o F with PMHx of moderat persistent asthma, Hx of tobacco abuse, HTN, DL, anxiety who came to the ER 12/29 due to worsening of SOB, cough, sputum production for the alst 2 days, and was found to have a fib with RVR after nebs  Her asthma has been under well control on Advair 250/50 with the last asthma exacrebation about 1 year ago. 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  Evaln pos for flu A & H. Flu, required cardizem drip & mechanical ventilation  DEVICES ETT 12/30 >>  CULTURES FLU A 12/29 PCR -  POSITIVE resp cx 12/29 >> h. flu  TAMIFLU 12/29 (flu a) >>  [facility] Lovenox Rx Dose Protonix    OVERNIGHT Sedated on vent , not weanable, peak pr high 20s Diuresed very little with lasix, BUN climbing    Blood pressure 105/72, pulse 86, temperature 99.9 F (37.7 C), temperature source Axillary, resp. rate 8, height 5\' 2"  (1.575 m), weight 74.3 kg (163 lb 12.8 oz), SpO2 92.00%.   Physical Exam Pt is sedated on vent HEENT: no JVD, no oral trush or postnasal drip. Looks cushingoid. ET-> vent CV: irregular rhythm, S1 and S2 normal Lungs: decreased air movement Abdomen: soft, non tender, non distended. BS + No peritoneal signs. Extrem: no low extrem edema, no calf tenderness. Neurol: sedated  pCXR - bibasal infx  BMET    Component Value Date/Time   NA 134* 08/26/2011 0307   K 4.3 08/26/2011 0307   CL 97 08/26/2011 0307   CO2 32 08/26/2011 0307   GLUCOSE 224* 08/26/2011 0307   BUN 53* 08/26/2011 0307   CREATININE 0.99 08/26/2011 0307   CALCIUM 8.9 08/26/2011 0307   GFRNONAA 59* 08/26/2011 0307   GFRAA 69* 08/26/2011 0307    CBC    Component Value Date/Time   WBC 9.7 08/26/2011 0307   RBC 3.88 08/26/2011 0307   HGB 11.7* 08/26/2011 0307   HCT 35.9* 08/26/2011 0307   PLT 245 08/26/2011 0307   MCV 92.5 08/26/2011 0307   MCH 30.2 08/26/2011 0307   MCHC 32.6  08/26/2011 0307   RDW 13.3 08/26/2011 0307   LYMPHSABS 0.6* 08/24/2011 1650   MONOABS 1.2* 08/24/2011 1650   EOSABS 0.0 08/24/2011 1650   BASOSABS 0.0 08/24/2011 1650    ABG    Component Value Date/Time   PHART 7.318* 08/25/2011 0433   PCO2ART 59.8* 08/25/2011 0433   PO2ART 80.3 08/25/2011 0433   HCO3 29.8* 08/25/2011 0433   TCO2 27.5 08/25/2011 0433   ACIDBASEDEF 0.8 08/23/2011 1600   O2SAT 96.4 08/25/2011 0433       Assessment/Plan Pt is a 66 y/o F with PMHx of moderate persistent asthma, Hx of tobacco abuse, HTN, DL, anxiety with flu A followed by Hemophilus bacterial pneumona & fib with RVR.  1. Asthma/COPD excerbation/ACute REsp Failure due to FLU A & hemophilus pna - Pt has Hx of moderate to persistent asthma, and Hx of tobacco abuse (15 p/yHx) - Failed conventional medical Rx PLAN  - Intubated, - Permissive hypercapnia (RR 10-12 andpH > 7.2 is goal) - Continue nebulizers and corticosteroids and antibiotics and Tamiflu -- hope to start wean as bspasm/ hypercarbia resolves    2. A fib with RVR/ Htn - Lilkely due to acute resp infection/asthma exacerbation - TSH pending - Started on cardizem, controlled rate  BP stable PLAN - Cardiology  Consulted. - Enoxaparin Rx dose - TTE pending (reordered 12/30) - Change to PO Cardizem per cards - overlap 3 h with drip  3. Hyperglycemia - steroid induced -add 10U lantus while on steroids   4. DVT prophylaxis - Anticoag with Lovenox for now  5. Oliguria - Lasix given 12/31, BUN high -feel dry , hydrate with fluids, chk cvp  6. Global  Is a boyfriend of 8 years. Her daughter is in [place] at the moment she is 66 years old. She has asked that her daughter make decisions for her in the event she is unable to. Boyfriend has been updated to the above events at the bedside    Cyril Mourning MD. FCCP. [place] [facility] Pager [phone] If no response call [phone]   08/26/2011, 9:04 AM Care during the described  time interval was provided by me and/or other providers on the critical care team.  I have reviewed this patient's available data, including medical history, events of note, physical examination and test results as part of my evaluation  CC time x 20m

## 2011-08-26 NOTE — Progress Notes (Signed)
[name]  67 y.o.  female  Subjective: Patient still intubated. Now sedated on vent. Oliguria responded to IV Lasix.  Allergy: Codeine  Objective: Vital signs in last 24 hours: Temp:  [97.5 F (36.4 C)-99.9 F (37.7 C)] 99.9 F (37.7 C) (01/01 0400) Pulse Rate:  [67-99] 93  (01/01 0700) Resp:  [11-17] 11  (01/01 0700) BP: (97-124)/(48-74) 110/62 mmHg (01/01 0700) SpO2:  [90 %-98 %] 90 % (01/01 0700) FiO2 (%):  [30 %] 30 % (01/01 0700) Weight:  [74.3 kg (163 lb 12.8 oz)] 163 lb 12.8 oz (74.3 kg) (01/01 0233)  163 lb 12.8 oz (74.3 kg) Body mass index is 29.96 kg/(m^2).  Weight change: 1.4 kg (3 lb 1.4 oz) Last BM Date: 08/22/11  Intake/Output from previous day: 12/31 0701 - 01/01 0700 In: 2126 [I.V.:1546; NG/GT:380; IV Piggyback:200] Out: 645 [Urine:645]  General- Well developed;appears comfortable on vent. Neck- No JVD, no carotid bruits Lungs-prolonged exp phase; mild diffuse exp. wheeze Cardiovascular- normal PMI; distant S1 and S2; irreg Abdomen- normal bowel sounds; soft and non-tender without masses or organomegaly Skin- Warm, no significant lesions Extremities- Nl distal pulses; no edema  CBC:    Basename 08/26/11 0307 08/24/11 1650  WBC 9.7 14.6*  HGB 11.7* 13.6  HCT 35.9* 40.3  PLT 245 266   BMET:   Basename 08/26/11 0307 08/25/11 0345  NA 134* 133*  K 4.3 4.3  CL 97 97  CO2 32 29  GLUCOSE 224* 170*  BUN 53* 27*  CREATININE 0.99 0.58  CALCIUM 8.9 9.0   GFR:  Estimated Creatinine Clearance: 54.9 ml/min (by C-G formula based on Cr of 0.99).  Telemetry: Afib with controlled rate.  Imaging Studies/Results:   Medications: I have reviewed the patient's current medications.  Infusions:      . diltiazem (CARDIZEM) infusion 15 mg/hr (08/26/11 0231)  . feeding supplement (OXEPA) 1,000 mL (08/26/11 0030)  . fentaNYL infusion INTRAVENOUS 75 mcg/hr (08/26/11 0040)  . midazolam (VERSED) infusion 2 mg/hr (08/25/11 2253)  . DISCONTD: feeding  supplement (JEVITY 1.2)      Active Problems:  * No active hospital problems. *    Assessment/Plan: AF: Heart rate well controlled on diltiazem at 15 mg/hr. On full dose lovenox, but risk of thromboembolism relatively low.  Echo 08/23/11-normal LV systolic function; no significant valvular abnormalities. Normal TSH. We can try and switch diltiazem to per NG today. Will need to watch BP carefully. Comparable po dose is 90 mg qid.  Hypertension: BP improved with discontinuation of clonidine patch.   Modest diuresis with improvement in pulmonary edema.  Additional furosemide can be ordered as needed.  Hyponatremia-improved.  Influenza A-appropriate therapy has been ordered.   LOS: 4 days   Theron Arista [facility] 08/26/2011, 7:32 AM

## 2011-08-27 ENCOUNTER — Inpatient Hospital Stay (HOSPITAL_COMMUNITY): Payer: 59

## 2011-08-27 LAB — BASIC METABOLIC PANEL
CO2: 32 mEq/L (ref 19–32)
Calcium: 8.5 mg/dL (ref 8.4–10.5)
Creatinine, Ser: 0.66 mg/dL (ref 0.50–1.10)
GFR calc non Af Amer: >90 mL/min (ref >90)
Glucose, Bld: 248 mg/dL — ABNORMAL HIGH (ref 70–99)
Sodium: 140 mEq/L (ref 135–145)

## 2011-08-27 LAB — BLOOD GAS, ARTERIAL
Acid-Base Excess: 4.6 mmol/L — ABNORMAL HIGH (ref 0.0–2.0)
Bicarbonate: 31.9 mEq/L — ABNORMAL HIGH (ref 20.0–24.0)
Drawn by: 331471
FIO2: 0.4 %
MECHVT: 0.45 mL
Mode: POSITIVE
PEEP: 5 cmH2O
Patient temperature: 98.6
Pressure support: 5 cmH2O
TCO2: 29.6 mmol/L (ref 0–100)
TCO2: 29.3 mmol/L (ref 0–100)
pCO2 arterial: 62.5 mmHg (ref 35.0–45.0)
pCO2 arterial: 61.4 mmHg (ref 35.0–45.0)
pH, Arterial: 7.324 — ABNORMAL LOW (ref 7.350–7.400)
pH, Arterial: 7.334 — ABNORMAL LOW (ref 7.350–7.400)
pO2, Arterial: 91.3 mmHg (ref 80.0–100.0)

## 2011-08-27 LAB — CBC
MCH: 30 pg (ref 26.0–34.0)
MCV: 93.5 fL (ref 78.0–100.0)
Platelets: 223 10*3/uL (ref 150–400)
RBC: 3.67 MIL/uL — ABNORMAL LOW (ref 3.87–5.11)
RDW: 13.4 % (ref 11.5–15.5)

## 2011-08-27 LAB — GLUCOSE, CAPILLARY
Glucose-Capillary: 209 mg/dL — ABNORMAL HIGH (ref 70–99)
Glucose-Capillary: 235 mg/dL — ABNORMAL HIGH (ref 70–99)

## 2011-08-27 MED ORDER — INSULIN GLARGINE 100 UNIT/ML ~~LOC~~ SOLN
15.0000 [IU] | Freq: Every day | SUBCUTANEOUS | Status: DC
  Administered 2011-08-28 – 2011-09-02 (×6): 15 [IU] via SUBCUTANEOUS
  Filled 2011-08-27: qty 3

## 2011-08-27 MED ORDER — ENOXAPARIN SODIUM 40 MG/0.4ML ~~LOC~~ SOLN
40.0000 mg | SUBCUTANEOUS | Status: DC
  Administered 2011-08-27 – 2011-09-01 (×6): 40 mg via SUBCUTANEOUS
  Filled 2011-08-27 (×7): qty 0.4

## 2011-08-27 MED ORDER — SODIUM CHLORIDE 0.9 % IV SOLN
250.0000 mL | INTRAVENOUS | Status: DC | PRN
  Administered 2011-08-27: 18:00:00 via INTRAVENOUS
  Administered 2011-08-28: 1000 mL via INTRAVENOUS

## 2011-08-27 MED ORDER — INSULIN ASPART 100 UNIT/ML ~~LOC~~ SOLN
3.0000 [IU] | SUBCUTANEOUS | Status: DC
  Administered 2011-08-27 – 2011-08-30 (×16): 3 [IU] via SUBCUTANEOUS

## 2011-08-27 NOTE — Progress Notes (Signed)
UR complete 

## 2011-08-27 NOTE — Progress Notes (Signed)
PCCM PROGRESS NOTE  Chief Complaint: Asthma/COPD exacerbation, Afib with RVR  HPI: Pt is a 66 y/o F with PMHx of moderate persistent asthma, Hx of tobacco abuse, HTN, DL, anxiety who came to the ER 12/29 due to worsening of SOB, cough, sputum production for the alst 2 days, and was found to have a fib with RVR after nebs  Her asthma has been under well control on Advair 250/50 with the last asthma exacrebation about 1 year ago. 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  Evaln pos for flu A & H. Flu, required cardizem drip & mechanical ventilation  DEVICES ETT 12/30 >>  CULTURES FLU A 12/29 PCR -  POSITIVE resp cx 12/29 >> h. flu  TAMIFLU 12/29 (flu a) >> 12/12 roc>> 12/29 zithro>>  [facility]ACT[facility] Lovenox Rx Dose Protonix    OVERNIGHT Sedated on vent , weaning with good Tv on PS 5/5 , prolonged expiration    Blood pressure 142/72, pulse 83, temperature 97 F (36.1 C), temperature source Axillary, resp. rate 10, height 5\' 2"  (1.575 m), weight 172 lb 9.9 oz (78.3 kg), SpO2 98.00%.   Physical Exam Pt is on vent HEENT: no JVD, no oral trush or postnasal drip. Looks cushingoid. ET-> vent CV: irregular rhythm, S1 and S2 normal Lungs: decreased air movement. prolonged exp. phase Abdomen: soft, non tender, non distended. BS + No peritoneal signs. Extrem: no low extrem edema, no calf tenderness. Neuro:: sedated  pCXR - interstitial prominence, tube in position  BMET    Component Value Date/Time   NA 140 08/27/2011 0302   K 4.4 08/27/2011 0302   CL 103 08/27/2011 0302   CO2 32 08/27/2011 0302   GLUCOSE 248* 08/27/2011 0302   BUN 58* 08/27/2011 0302   CREATININE 0.66 08/27/2011 0302   CALCIUM 8.5 08/27/2011 0302   GFRNONAA >90 08/27/2011 0302   GFRAA >90 08/27/2011 0302    CBC    Component Value Date/Time   WBC 7.2 08/27/2011 0302   RBC 3.67* 08/27/2011 0302   HGB 11.0* 08/27/2011 0302   HCT 34.3* 08/27/2011 0302   PLT 223 08/27/2011 0302   MCV 93.5 08/27/2011 0302   MCH 30.0 08/27/2011 0302   MCHC 32.1 08/27/2011 0302   RDW 13.4 08/27/2011 0302   LYMPHSABS 0.6* 08/24/2011 1650   MONOABS 1.2* 08/24/2011 1650   EOSABS 0.0 08/24/2011 1650   BASOSABS 0.0 08/24/2011 1650    ABG    Component Value Date/Time   PHART 7.334* 08/27/2011 0839   PCO2ART 61.4* 08/27/2011 0839   PO2ART 140.0* 08/27/2011 0839   HCO3 31.8* 08/27/2011 0839   TCO2 29.3 08/27/2011 0839   ACIDBASEDEF 0.8 08/23/2011 1600   O2SAT 99.0 08/27/2011 0839       Assessment/Plan Pt is a 66 y/o F with PMHx of moderate persistent asthma, Hx of tobacco abuse, HTN, DL, anxiety with flu A followed by Hemophilus bacterial pneumona & fib with RVR.  1. Asthma/COPD excerbation/ACute REsp Failure due to FLU A & hemophilus pna - Pt has Hx of moderate to persistent asthma, and Hx of tobacco abuse (15 p/yHx)  PLAN  - Intubated, - Permissive hypercapnia (RR 10-12 andpH > 7.2 is goal) - Continue nebulizers  and antibiotics and Tamiflu, decrease medrol 40 q 8 -- Weaning well but hold off extubation given resp acidosis 1/2    2. A fib with RVR/ Htn - Lilkely due to acute resp infection/asthma exacerbation - TSH nml - Started on cardizem, controlled rate  BP stable -echo - nml LV fn PLAN - Enoxaparin proph dose - Changed to PO Cardizem per cards , off drip  3. Hyperglycemia - steroid induced -Increase  15U lantus while on steroids -add TF coverage   4. DVT prophylaxis - Anticoag with Lovenox for now  5. Oliguria - Lasix given 12/31, BUN high -feel dry , hydrate with fluids, chk cvp  6. Global  Is a boyfriend of 8 years. Her daughter is in [place] at the moment she is 66 years old. She has asked that her daughter make decisions for her in the event she is unable to. Boyfriend has been updated to the above events at the bedside    Dr. [facility] Minor ACNP [facility]ack PCCM Pager [phone] till 3 pm If no answer page [phone] 08/27/2011, 9:50 AM [name] Coronado V.

## 2011-08-27 NOTE — Progress Notes (Signed)
[name]  66 y.o.  female  Subjective: Patient still intubated. Now sedated on vent. Converted to NSR at 4:30 pm yesterday.  Allergy: Codeine  Objective: Vital signs in last 24 hours: Temp:  [97.4 F (36.3 C)-99.6 F (37.6 C)] 97.4 F (36.3 C) (01/02 0400) Pulse Rate:  [34-86] 78  (01/02 0600) Resp:  [8-18] 12  (01/02 0600) BP: (104-125)/(56-75) 119/69 mmHg (01/02 0600) SpO2:  [90 %-100 %] 97 % (01/02 0600) FiO2 (%):  [30 %-40 %] 40 % (01/02 0422) Weight:  [78.3 kg (172 lb 9.9 oz)] 172 lb 9.9 oz (78.3 kg) (01/02 0400)  172 lb 9.9 oz (78.3 kg) Body mass index is 31.57 kg/(m^2).  Weight change: 4 kg (8 lb 13.1 oz) Last BM Date: 08/22/11  Intake/Output from previous day: 01/01 0701 - 01/02 0700 In: 2708.6 [I.V.:1738.6; NG/GT:920; IV Piggyback:50] Out: 1435 [Urine:1425; Emesis/NG output:10]  General- Well developed;appears comfortable on vent. Neck- No JVD, no carotid bruits Lungs-prolonged exp phase; mild diffuse exp. wheeze Cardiovascular- normal PMI; distant S1 and S2; irreg Abdomen- normal bowel sounds; soft and non-tender without masses or organomegaly Skin- Warm, no significant lesions Extremities- Nl distal pulses; no edema  CBC:    Basename 08/27/11 0302 08/26/11 0307  WBC 7.2 9.7  HGB 11.0* 11.7*  HCT 34.3* 35.9*  PLT 223 245   BMET:   Basename 08/27/11 0302 08/26/11 0307  NA 140 134*  K 4.4 4.3  CL 103 97  CO2 32 32  GLUCOSE 248* 224*  BUN 58* 53*  CREATININE 0.66 0.99  CALCIUM 8.5 8.9   GFR:  Estimated Creatinine Clearance: 69.8 ml/min (by C-G formula based on Cr of 0.66).  Telemetry: NSR  Imaging Studies/Results:   Medications: I have reviewed the patient's current medications.  Infusions:      . feeding supplement (OXEPA) 1,000 mL (08/26/11 1758)  . fentaNYL infusion INTRAVENOUS 75 mcg/hr (08/26/11 1809)  . midazolam (VERSED) infusion 3 mg/hr (08/26/11 1809)  . DISCONTD: diltiazem (CARDIZEM) infusion 15 mg/hr (08/26/11 0231)     Active Problems:  * No active hospital problems. *    Assessment/Plan: AF: Now converted to NSR. On full dose lovenox, but risk of thromboembolism relatively low.  Will switch to DVT prophylaxis dose. Echo 08/23/11-normal LV systolic function; no significant valvular abnormalities. Normal TSH. On oral diltiazem.  Hypertension: BP controlled.  Hyponatremia-improved.  Plan: With resolution of atrial fibrillation no active cardiac issues. Will sign off now. Please call if needed.   LOS: 5 days   Theron Arista [facility] 08/27/2011, 7:04 AM

## 2011-08-28 ENCOUNTER — Inpatient Hospital Stay (HOSPITAL_COMMUNITY): Payer: 59

## 2011-08-28 DIAGNOSIS — J111 Influenza due to unidentified influenza virus with other respiratory manifestations: Secondary | ICD-10-CM

## 2011-08-28 DIAGNOSIS — J45902 Unspecified asthma with status asthmaticus: Secondary | ICD-10-CM

## 2011-08-28 DIAGNOSIS — I4891 Unspecified atrial fibrillation: Secondary | ICD-10-CM

## 2011-08-28 DIAGNOSIS — J9600 Acute respiratory failure, unspecified whether with hypoxia or hypercapnia: Secondary | ICD-10-CM

## 2011-08-28 LAB — BLOOD GAS, ARTERIAL
Acid-Base Excess: 11.7 mmol/L — ABNORMAL HIGH (ref 0.0–2.0)
Drawn by: 317871
Drawn by: 295031
MECHVT: 450 mL
PEEP: 5 cmH2O
PEEP: 5 cmH2O
Patient temperature: 97.8
Pressure support: 5 cmH2O
pCO2 arterial: 58.9 mmHg (ref 35.0–45.0)
pH, Arterial: 7.411 — ABNORMAL HIGH (ref 7.350–7.400)
pH, Arterial: 7.425 — ABNORMAL HIGH (ref 7.350–7.400)

## 2011-08-28 LAB — GLUCOSE, CAPILLARY
Glucose-Capillary: 177 mg/dL — ABNORMAL HIGH (ref 70–99)
Glucose-Capillary: 155 mg/dL — ABNORMAL HIGH (ref 70–99)
Glucose-Capillary: 121 mg/dL — ABNORMAL HIGH (ref 70–99)

## 2011-08-28 LAB — BASIC METABOLIC PANEL
BUN: 40 mg/dL — ABNORMAL HIGH (ref 6–23)
Chloride: 107 mEq/L (ref 96–112)
Creatinine, Ser: 0.44 mg/dL — ABNORMAL LOW (ref 0.50–1.10)
GFR calc non Af Amer: >90 mL/min (ref >90)
Glucose, Bld: 190 mg/dL — ABNORMAL HIGH (ref 70–99)
Potassium: 4.3 mEq/L (ref 3.5–5.1)

## 2011-08-28 LAB — CBC
HCT: 36.6 % (ref 36.0–46.0)
Hemoglobin: 11.4 g/dL — ABNORMAL LOW (ref 12.0–15.0)
MCHC: 31.1 g/dL (ref 30.0–36.0)
MCV: 95.6 fL (ref 78.0–100.0)

## 2011-08-28 MED ORDER — HYDRALAZINE HCL 20 MG/ML IJ SOLN
10.0000 mg | INTRAMUSCULAR | Status: DC | PRN
  Administered 2011-08-28 – 2011-08-29 (×3): 20 mg via INTRAVENOUS
  Administered 2011-08-30: 10 mg via INTRAVENOUS
  Filled 2011-08-28 (×4): qty 1

## 2011-08-28 MED ORDER — METHYLPREDNISOLONE SODIUM SUCC 40 MG IJ SOLR
40.0000 mg | Freq: Three times a day (TID) | INTRAMUSCULAR | Status: DC
  Administered 2011-08-28 – 2011-08-29 (×4): 40 mg via INTRAVENOUS
  Filled 2011-08-28 (×3): qty 1

## 2011-08-28 NOTE — Plan of Care (Signed)
Problem: Phase II Progression Outcomes Goal: Time pt extubated/weaned off vent Outcome: Completed/Met Date Met:  08/28/11 Extubated 1610

## 2011-08-28 NOTE — Procedures (Signed)
Extubation Procedure Note  Patient Details:   Name: [name] DOB: 07-10-1946 MRN: [id number]   Airway Documentation:  Airway 7.5 mm (Active)  Secured at (cm) 23 cm 08/28/2011  7:50 AM  Measured From Lips 08/28/2011  7:50 AM  Secured Location Center 08/28/2011  7:50 AM  Secured By [employer] 08/28/2011  7:50 AM  Tube Holder Repositioned Yes 08/28/2011  7:50 AM  Cuff Pressure (cm H2O) 26 cm H2O 08/26/2011  7:55 AM    Evaluation  O2 sats: O2 sats 94 Complications: none Patient did tolerate procedure well. Bilateral Breath Sounds: Diminished Suctioning: Airway   Revonda Humphrey 08/28/2011, [id number]

## 2011-08-28 NOTE — Progress Notes (Signed)
PCCM PROGRESS NOTE  Chief Complaint: Asthma/COPD exacerbation, Afib with RVR  HPI: Pt is a 66 y/o F with PMHx of moderate persistent asthma, Hx of tobacco abuse, HTN, DL, anxiety who came to the ER 12/29 due to worsening of SOB, cough, sputum production for the alst 2 days, and was found to have a fib with RVR after nebs  Her asthma has been under well control on Advair 250/50 with the last asthma exacrebation about 1 year ago. 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  Evaln pos for flu A & H. Flu, required cardizem drip & mechanical ventilation  DEVICES ETT 12/30 >>1/3  CULTURES FLU A 12/29 PCR -  POSITIVE resp cx 12/29 >> h. flu  TAMIFLU 12/29 (flu a) >>1/3 12/29 roc>> 12/29 zithro>>  BEST PRACTICE Lovenox Rx Dose Protonix    OVERNIGHT Sedated on vent , weaning with good Tv on PS 5/5 , prolonged expiration.    Blood pressure 164/98, pulse 103, temperature 97.5 F (36.4 C), temperature source Axillary, resp. rate 12, height 5\' 2"  (1.575 m), weight 177 lb 4 oz (80.4 kg), SpO2 96.00%.   Physical Exam Pt is on vent HEENT: no JVD, no oral trush or postnasal drip. Looks cushingoid. ET-> vent CV: irregular rhythm, S1 and S2 normal Lungs: decreased air movement. prolonged exp. phase Abdomen: soft, non tender, non distended. BS + No peritoneal signs. Extrem: no low extrem edema, no calf tenderness. Neuro:: sedated  pCXR - 1/3:  1. Stable positioning of support apparatus. No pneumothorax.  2. Grossly unchanged findings of pulmonary edema versus atypical  infection.  BMET    Component Value Date/Time   NA 146* 08/28/2011 0310   K 4.3 08/28/2011 0310   CL 107 08/28/2011 0310   CO2 35* 08/28/2011 0310   GLUCOSE 190* 08/28/2011 0310   BUN 40* 08/28/2011 0310   CREATININE 0.44* 08/28/2011 0310   CALCIUM 8.7 08/28/2011 0310   GFRNONAA >90 08/28/2011 0310   GFRAA >90 08/28/2011 0310    CBC    Component Value Date/Time   WBC 8.3 08/28/2011 0310   RBC 3.83*  08/28/2011 0310   HGB 11.4* 08/28/2011 0310   HCT 36.6 08/28/2011 0310   PLT 222 08/28/2011 0310   MCV 95.6 08/28/2011 0310   MCH 29.8 08/28/2011 0310   MCHC 31.1 08/28/2011 0310   RDW 13.7 08/28/2011 0310   LYMPHSABS 0.6* 08/24/2011 1650   MONOABS 1.2* 08/24/2011 1650   EOSABS 0.0 08/24/2011 1650   BASOSABS 0.0 08/24/2011 1650    ABG    Component Value Date/Time   PHART 7.411* 08/28/2011 0443   PCO2ART 58.9* 08/28/2011 0443   PO2ART 136.0* 08/28/2011 0443   HCO3 36.9* 08/28/2011 0443   TCO2 33.7 08/28/2011 0443   ACIDBASEDEF 0.8 08/23/2011 1600   O2SAT 99.3 08/28/2011 0443       Assessment/Plan Pt is a 66 y/o F with PMHx of moderate persistent asthma, Hx of tobacco abuse, HTN, DL, anxiety with flu A followed by Hemophilus bacterial pneumona & fib with RVR.  1. Asthma/COPD excerbation/ACute REsp Failure due to FLU A & hemophilus pna - Pt has Hx of moderate to persistent asthma, and Hx of tobacco abuse (15 p/yHx)  PLAN  - Intubated, - Continue nebulizers  and antibiotics and Tamiflu, decrease medrol 40 q 8 >> taper rapidly to PO once extubated -- Weaning well -1/3 recheck abg on 5/5 if ok will extubate. Appears to have a component of COPD - note compensated  hypoercarbia    2. A fib with RVR/ Htn - Lilkely due to acute resp infection/asthma exacerbation - TSH nml - Started on cardizem, controlled rate BP stable -echo - nml LV fn PLAN - Enoxaparin proph dose - Changed to PO Cardizem per cards , off drip  3. Hyperglycemia - steroid induced -Increased  15U lantus while on steroids -added TF coverage   4. DVT prophylaxis - Anticoag with Lovenox for now  5. Oliguria - Lasix given 12/31, BUN high -feel dry , hydrate with fluids  6. Global  Is a boyfriend of 8 years. Her daughter is in [place] at the moment she is 66 years old. She has asked that her daughter make decisions for her in the event she is unable to. Boyfriend has been updated to the above events at the bedside    [facility]  Minor ACNP Adolph Pollack PCCM Pager [phone] till 3 pm If no answer page [phone] 08/28/2011, 8:26 AM MINOR,WILLIAM S  Care during the described time interval was provided by me and/or other providers on the critical care team.  I have reviewed this patient's available data, including medical history, events of note, physical examination and test results as part of my evaluation  CC time x 32 m ALVA,RAKESH V.

## 2011-08-28 NOTE — Progress Notes (Signed)
Pt was extubated to 5L [place], titrated to 4L.  Pt tolerated well, still sedated/sleepy, RT will continue to monitor.

## 2011-08-28 NOTE — Progress Notes (Signed)
Recommend HgbA1C to assess glycemic control at home.

## 2011-08-29 ENCOUNTER — Inpatient Hospital Stay (HOSPITAL_COMMUNITY): Payer: 59

## 2011-08-29 LAB — GLUCOSE, CAPILLARY
Glucose-Capillary: 158 mg/dL — ABNORMAL HIGH (ref 70–99)
Glucose-Capillary: 183 mg/dL — ABNORMAL HIGH (ref 70–99)

## 2011-08-29 LAB — BLOOD GAS, ARTERIAL
Acid-Base Excess: 14.5 mmol/L — ABNORMAL HIGH (ref 0.0–2.0)
TCO2: 34.2 mmol/L (ref 0–100)
pCO2 arterial: 47 mmHg — ABNORMAL HIGH (ref 35.0–45.0)
pO2, Arterial: 66.6 mmHg — ABNORMAL LOW (ref 80.0–100.0)

## 2011-08-29 LAB — BASIC METABOLIC PANEL
BUN: 32 mg/dL — ABNORMAL HIGH (ref 6–23)
CO2: 38 mEq/L — ABNORMAL HIGH (ref 19–32)
Chloride: 101 mEq/L (ref 96–112)
Creatinine, Ser: 0.36 mg/dL — ABNORMAL LOW (ref 0.50–1.10)
GFR calc Af Amer: >90 mL/min (ref >90)

## 2011-08-29 LAB — CBC
HCT: 36.1 % (ref 36.0–46.0)
MCV: 94.5 fL (ref 78.0–100.0)
RDW: 14 % (ref 11.5–15.5)
WBC: 13 10*3/uL — ABNORMAL HIGH (ref 4.0–10.5)

## 2011-08-29 MED ORDER — SODIUM CHLORIDE 0.9 % IV SOLN
250.0000 mL | INTRAVENOUS | Status: DC | PRN
  Administered 2011-08-29: 250 mL via INTRAVENOUS

## 2011-08-29 MED ORDER — ACETAMINOPHEN 325 MG PO TABS
650.0000 mg | ORAL_TABLET | Freq: Four times a day (QID) | ORAL | Status: DC | PRN
  Administered 2011-08-29: 650 mg via ORAL
  Filled 2011-08-29: qty 2

## 2011-08-29 MED ORDER — LEVALBUTEROL HCL 0.63 MG/3ML IN NEBU
0.6300 mg | INHALATION_SOLUTION | RESPIRATORY_TRACT | Status: DC | PRN
  Filled 2011-08-29: qty 3

## 2011-08-29 MED ORDER — DIPHENHYDRAMINE HCL 25 MG PO CAPS
25.0000 mg | ORAL_CAPSULE | Freq: Every evening | ORAL | Status: DC | PRN
Start: 2011-08-29 — End: 2011-09-02
  Administered 2011-08-29: 25 mg via ORAL
  Filled 2011-08-29: qty 1

## 2011-08-29 MED ORDER — METHYLPREDNISOLONE SODIUM SUCC 40 MG IJ SOLR
40.0000 mg | Freq: Two times a day (BID) | INTRAMUSCULAR | Status: DC
  Administered 2011-08-30: 40 mg via INTRAVENOUS
  Filled 2011-08-29 (×2): qty 1

## 2011-08-29 NOTE — Progress Notes (Signed)
Physical Therapy Note  Orders received. Chart reviewed. Noted pt on strict bedrest in order set. MD please update pt's activity level for participation with physical therapy. Thanks.

## 2011-08-29 NOTE — Progress Notes (Signed)
PCCM PROGRESS NOTE  Chief Complaint: Asthma/COPD exacerbation, Afib with RVR  HPI: Pt is a 66 y/o F with PMHx of moderate persistent asthma, Hx of tobacco abuse, HTN, DL, anxiety who came to the ER 12/29 due to worsening of SOB, cough, sputum production for the alst 2 days, and was found to have a fib with RVR after nebs  Her asthma has been under well control on Advair 250/50 with the last asthma exacrebation about 1 year ago. 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  Evaln pos for flu A & H. Flu, required cardizem drip & mechanical ventilation  DEVICES ETT 12/30 >>1/3  CULTURES FLU A 12/29 PCR -  POSITIVE resp cx 12/29 >> h. flu  TAMIFLU 12/29 (flu a) >>1/3 12/29 roc>> 12/29 zithro>>1/4  BEST PRACTICE Lovenox Rx Dose Protonix    OVERNIGHT Off vent .     Blood pressure 176/78, pulse 112, temperature 98.1 F (36.7 C), temperature source Axillary, resp. rate 22, height 5\' 2"  (1.575 m), weight 177 lb 4 oz (80.4 kg), SpO2 92.00%.   Physical Exam  HEENT: no JVD, no oral trush or postnasal drip. Looks cushingoid.   CV: irregular rhythm, S1 and S2 normal Lungs: decreased air movement. prolonged exp. Phase. NAD Abdomen: soft, non tender, non distended. BS + No peritoneal signs. Extrem: no low extrem edema, no calf tenderness. Neuro:: sedated  pCXR - 1/4:  . No change in aeration after removal of endotracheal tube. Little  change in airspace disease  BMET    Component Value Date/Time   NA 144 08/29/2011 0307   K 3.9 08/29/2011 0307   CL 101 08/29/2011 0307   CO2 38* 08/29/2011 0307   GLUCOSE 151* 08/29/2011 0307   BUN 32* 08/29/2011 0307   CREATININE 0.36* 08/29/2011 0307   CALCIUM 8.6 08/29/2011 0307   GFRNONAA >90 08/29/2011 0307   GFRAA >90 08/29/2011 0307    CBC    Component Value Date/Time   WBC 13.0* 08/29/2011 0307   RBC 3.82* 08/29/2011 0307   HGB 11.5* 08/29/2011 0307   HCT 36.1 08/29/2011 0307   PLT 254 08/29/2011 0307   MCV 94.5 08/29/2011 0307   MCH  30.1 08/29/2011 0307   MCHC 31.9 08/29/2011 0307   RDW 14.0 08/29/2011 0307   LYMPHSABS 0.6* 08/24/2011 1650   MONOABS 1.2* 08/24/2011 1650   EOSABS 0.0 08/24/2011 1650   BASOSABS 0.0 08/24/2011 1650    ABG    Component Value Date/Time   PHART 7.532* 08/29/2011 0414   PCO2ART 47.0* 08/29/2011 0414   PO2ART 66.6* 08/29/2011 0414   HCO3 39.3* 08/29/2011 0414   TCO2 34.2 08/29/2011 0414   ACIDBASEDEF 0.8 08/23/2011 1600   O2SAT 95.1 08/29/2011 0414       Assessment/Plan Pt is a 66 y/o F with PMHx of moderate persistent asthma, Hx of tobacco abuse, HTN, DL, anxiety with flu A followed by Hemophilus bacterial pneumona & fib with RVR.  1. Asthma/COPD excerbation/Acute Resp Failure due to FLU A & hemophilus pna - Pt has Hx of moderate to persistent asthma, and Hx of tobacco abuse (15 p/yHx)  PLAN  - Exntubated,1/3 - Continue nebulizers  and ceftx  X 7ds, decrease medrol 40 q 12 >> taper to PO in 24h  -. Appears to have a component of COPD  - note compensated hypoercarbia    2. A fib with RVR/ Htn - Lilkely due to acute resp infection/asthma exacerbation - TSH nml - Started on cardizem,  controlled rate BP stable -echo - nml LV fn PLAN - Enoxaparin proph dose - Changed to PO Cardizem per cards , off drip  3. Hyperglycemia - steroid induced -Increased  15U lantus while on steroids -watch as steroids lowered   4. DVT prophylaxis - Anticoag with Lovenox for now  5. Oliguria - Lasix given 12/31, BUN high - hydrate with fluids -po intake   6. Global - plan for HHPT vs inpt rehab, OT consult  Is a boyfriend of 8 years. Her daughter is in [place] at the moment she is 66 years old. She has asked that her daughter make decisions for her in the event she is unable to. Boyfriend has been updated to the above events at the bedside.   Cyril Mourning MD. Tonny Bollman. [facility] Pager [phone] If no response call [phone]

## 2011-08-29 NOTE — Progress Notes (Signed)
Physical Therapy Evaluation Patient Details Name: [name] MRN: [id number] DOB: 1945-09-05 Today's Date: 08/29/2011 Time: 8413-2440  Eval II Problem List:  Patient Active Problem List  Diagnoses  . HYPERCHOLESTEROLEMIA  . ANXIETY  . HYPERTENSION  . ASTHMA, MODERATE  . BACK PAIN, LUMBAR  . PERS HX NONCOMPLIANCE W/MED TX PRS HAZARDS HLTH    Past Medical History:  Past Medical History  Diagnosis Date  . Moderate asthma   . Hypertension   . Hypercholesteremia   . Lumbar back pain   . Anxiety    Past Surgical History:  Past Surgical History  Procedure Date  . Tonsillectomy and adnoidectomy   . Cholecystectomy   . Left thumb cmc arthroplasty 11/2010    by DrGramig    PT Assessment/Plan/Recommendation PT Assessment Clinical Impression Statement: Pt presents with diagnosis of acute respiratory failure due to flu and pneumonia. Pt currently demonstrates general weakness, decreased activity tolerance with limited mobility. Pt relayed fear of mobilizing during evaluation.Pt will benefit from skilled PT in the acute care setting to maximize independence with basic functional mobility in order to return to PLOF.  PT Recommendation/Assessment: Patient will need skilled PT in the acute care venue PT Problem List: Decreased strength;Decreased activity tolerance;Decreased range of motion;Decreased mobility;Decreased knowledge of use of DME;Cardiopulmonary status limiting activity Barriers to Discharge Comments: Pt lives alone. PT Therapy Diagnosis : Difficulty walking;Generalized weakness PT Plan PT Frequency: Min 3X/week PT Treatment/Interventions: DME instruction;Gait training;Functional mobility training;Stair training;Therapeutic activities;Therapeutic exercise;Patient/family education PT Recommendation Recommendations for Other Services: OT consult Follow Up Recommendations: Home health PT vs Inpatient Rehab (depending on progress) Equipment Recommended:  (to be  determined) PT Goals  Acute Rehab PT Goals PT Goal Formulation: With patient Pt will go Supine/Side to Sit: with supervision PT Goal: Supine/Side to Sit - Progress: Not met Pt will go Sit to Supine/Side: with supervision PT Goal: Sit to Supine/Side - Progress: Not met Pt will go Sit to Stand: with min assist PT Goal: Sit to Stand - Progress: Not met Pt will Transfer Bed to Chair/Chair to Bed: with min assist PT Transfer Goal: Bed to Chair/Chair to Bed - Progress: Not met Pt will Ambulate: 51 - 150 feet;with least restrictive assistive device;with min assist PT Goal: Ambulate - Progress: Not met Pt will Go Up / Down Stairs: 6-9 stairs;with rail(s);with min assist PT Goal: Up/Down Stairs - Progress: Not met Pt will Perform Home Exercise Program: with supervision, verbal cues required/provided PT Goal: Perform Home Exercise Program - Progress: Not met  PT Evaluation Precautions/Restrictions  Precautions Precautions: Fall Prior Functioning  Home Living Lives With: Alone Type of Home: Apartment Home Layout: One level Home Access: Stairs to enter Entrance Stairs-Rails: Right Entrance Stairs-Number of Steps: 20 Home Adaptive Equipment: None Prior Function Level of Independence: Independent with basic ADLs;Independent with gait;Independent with homemaking with ambulation Driving: Yes Vocation: Full time employment Cognition Cognition Arousal/Alertness: Lethargic Overall Cognitive Status: Appears within functional limits for tasks assessed Orientation Level: Oriented X4 Sensation/Coordination Sensation Light Touch: Appears Intact Extremity Assessment RUE Strength RUE Overall Strength Comments: appers WFL. Pt with good grip strength. Noted edema in hand. LUE Strength LUE Overall Strength Comments: appears WFL. Pt with good grip strength. Noted edema in hand RLE Assessment RLE Assessment: Exceptions to [place] RLE Strength Right Hip Flexion: 3/5 Right Hip ABduction: 2/5 Right  Hip ADduction: 2/5 Right Knee Extension: 3/5 Right Ankle Dorsiflexion: 3-/5 LLE Assessment LLE Assessment:  (Noted some foot drop at rest) LLE Strength Left Hip Flexion: 3/5 Left  Hip ABduction: 2/5 Left Hip ADduction: 2/5 Left Knee Extension: 3/5 Left Ankle Dorsiflexion: 2+/5 Mobility (including Balance) Bed Mobility Bed Mobility: Yes Supine to Sit: 3: Mod assist;HOB elevated (Comment degrees);With rails Supine to Sit Details (indicate cue type and reason): VCs safety, technique, hand placement. Increased time.  Assist for LEs off bed and trunk to upright. 2 attempts due to pt c/o dizziness on 1st attempt.  Sit to Supine - Right: 1: +2 Total assist (Pt=50%);HOB elevated (comment degrees);With rail Sit to Supine - Right Details (indicate cue type and reason): Vcs safety, technique, hand placement. Increased time. Assist to control trunk and get LEs back onto bed. Transfers Transfers: No (pt refused/declined-fearful and fatigued) Ambulation/Gait Ambulation/Gait: No  Posture/Postural Control Posture/Postural Control: No significant limitations Balance Balance Assessed: Yes Static Sitting Balance Static Sitting - Balance Support: Bilateral upper extremity supported;Feet supported Static Sitting - Level of Assistance: 5: Stand by assistance Static Sitting - Comment/# of Minutes: Pt sat EOB 1-2 minutes with use of 1 hand rail and 1 hand support. No LOB. Fatigued easily (pt requesting to return to supine) Dynamic Sitting Balance Dynamic Sitting - Balance Support: Bilateral upper extremity supported Dynamic Sitting - Level of Assistance: 5: Stand by assistance Dynamic Sitting - Balance Activities: Forward lean/weight shifting Dynamic Sitting - Comments: Pt fearful of getting to EOB to get both feet flat on floor. Fatigued easily.  Exercise  Other Exercises Other Exercises: Encouraged pt to perform bilateral finger/thumb flexion and extension AND ankle pumps while in bed.  End of  Session PT - End of Session Activity Tolerance: Patient limited by fatigue (Pt fearful of mobilizing) Patient left: in bed;with call bell in reach;with family/visitor present General Behavior During Session: Lethargic Cognition: WFL for tasks performed  Rebeca Alert [facility] 08/29/2011, 1:01 PM [phone]

## 2011-08-29 NOTE — Progress Notes (Signed)
UR complete 

## 2011-08-29 NOTE — Progress Notes (Signed)
Nutrition Follow-up  Diet Order:  Dys 3  Pt extubated yesterday, currently on venturi mask.  Access for enteral feeds removed.   Pt diet advanced to clear liquids yesterday which she was able to tolerate.  Diet advanced to  mechanical soft this am, however has not yet had a tray.    Needs re-estimated due to change in status:   1650-1800 kcal, 72-83 g  Meds: Scheduled Meds:   . antiseptic oral rinse  15 mL Mouth Rinse q12n4p  . azithromycin  500 mg Oral Daily  . cefTRIAXone (ROCEPHIN)  IV  1 g Intravenous QHS  . chlorhexidine  15 mL Mouth Rinse BID  . diltiazem  90 mg Oral Q6H  . enoxaparin (LOVENOX) injection  40 mg Subcutaneous Q24H  . insulin aspart  0-20 Units Subcutaneous Q4H  . insulin aspart  3 Units Subcutaneous Q4H  . insulin glargine  15 Units Subcutaneous Daily  . ipratropium  0.5 mg Nebulization Q3H  . levalbuterol  0.63 mg Nebulization Q3H  . methylPREDNISolone (SOLU-MEDROL) injection  40 mg Intravenous Q8H  . DISCONTD: feeding supplement  30 mL Oral TID WC  . DISCONTD: methylPREDNISolone (SOLU-MEDROL) injection  60 mg Intravenous Q8H  . DISCONTD: midazolam  4 mg Intravenous Once  . DISCONTD: pantoprazole sodium  40 mg Per Tube Q1200   Continuous Infusions:   . DISCONTD: feeding supplement (OXEPA) 1,000 mL (08/27/11 1717)  . DISCONTD: fentaNYL infusion INTRAVENOUS Stopped (08/28/11 0800)  . DISCONTD: midazolam (VERSED) infusion Stopped (08/28/11 0800)   PRN Meds:.sodium chloride, hydrALAZINE, levalbuterol, DISCONTD: sodium chloride, DISCONTD: albuterol, DISCONTD: fentaNYL, DISCONTD: midazolam  Labs:  CMP     Component Value Date/Time   NA 144 08/29/2011 0307   K 3.9 08/29/2011 0307   CL 101 08/29/2011 0307   CO2 38* 08/29/2011 0307   GLUCOSE 151* 08/29/2011 0307   BUN 32* 08/29/2011 0307   CREATININE 0.36* 08/29/2011 0307   CALCIUM 8.6 08/29/2011 0307   PROT 7.1 12/11/2010 1510   ALBUMIN 3.9 12/11/2010 1510   AST 33 12/11/2010 1510   ALT 40* 12/11/2010 1510   ALKPHOS 103  12/11/2010 1510   BILITOT 0.7 12/11/2010 1510   GFRNONAA >90 08/29/2011 0307   GFRAA >90 08/29/2011 0307     Intake/Output Summary (Last 24 hours) at 08/29/11 1141 Last data filed at 08/29/11 1100  Gross per 24 hour  Intake   1555 ml  Output   3010 ml  Net  -1455 ml    Weight Status:   Admission wt (12/28): 73.4 kg Current wt (1/3):  80.4 kg  Nutrition Dx:  Inadequate oral intake, ongoing  Intervention:   1.  General diet; pt to improve intake with diet tolerance.  Currently still weak and lethargic and remains on mask.  Monitor:  1.  Food/Beverage; pt to improve intake to >50% of meals.   Hoyt Koch Pager #:  [phone]

## 2011-08-30 ENCOUNTER — Inpatient Hospital Stay (HOSPITAL_COMMUNITY): Payer: 59

## 2011-08-30 LAB — BASIC METABOLIC PANEL
CO2: 33 mEq/L — ABNORMAL HIGH (ref 19–32)
Calcium: 8.7 mg/dL (ref 8.4–10.5)
Creatinine, Ser: 0.32 mg/dL — ABNORMAL LOW (ref 0.50–1.10)
GFR calc non Af Amer: >90 mL/min (ref >90)

## 2011-08-30 LAB — GLUCOSE, CAPILLARY
Glucose-Capillary: 139 mg/dL — ABNORMAL HIGH (ref 70–99)
Glucose-Capillary: 262 mg/dL — ABNORMAL HIGH (ref 70–99)
Glucose-Capillary: 163 mg/dL — ABNORMAL HIGH (ref 70–99)

## 2011-08-30 LAB — CBC
HCT: 38.3 % (ref 36.0–46.0)
Hemoglobin: 12.5 g/dL (ref 12.0–15.0)
MCHC: 32.6 g/dL (ref 30.0–36.0)
Platelets: 280 10*3/uL (ref 150–400)
RBC: 4.21 MIL/uL (ref 3.87–5.11)
RDW: 13.8 % (ref 11.5–15.5)
WBC: 13.3 10*3/uL — ABNORMAL HIGH (ref 4.0–10.5)

## 2011-08-30 MED ORDER — PREDNISONE 50 MG PO TABS
60.0000 mg | ORAL_TABLET | Freq: Every day | ORAL | Status: DC
  Administered 2011-08-30 – 2011-09-01 (×3): 60 mg via ORAL
  Filled 2011-08-30 (×3): qty 1

## 2011-08-30 MED ORDER — INSULIN ASPART 100 UNIT/ML ~~LOC~~ SOLN
0.0000 [IU] | Freq: Three times a day (TID) | SUBCUTANEOUS | Status: DC
  Administered 2011-08-30: 4 [IU] via SUBCUTANEOUS
  Administered 2011-08-30: 11 [IU] via SUBCUTANEOUS
  Administered 2011-08-31: 3 [IU] via SUBCUTANEOUS
  Administered 2011-08-31: 4 [IU] via SUBCUTANEOUS
  Administered 2011-09-01: 3 [IU] via SUBCUTANEOUS
  Administered 2011-09-01: 15 [IU] via SUBCUTANEOUS
  Administered 2011-09-02 (×2): 4 [IU] via SUBCUTANEOUS
  Filled 2011-08-30: qty 3

## 2011-08-30 MED ORDER — IPRATROPIUM BROMIDE 0.02 % IN SOLN
0.5000 mg | Freq: Four times a day (QID) | RESPIRATORY_TRACT | Status: DC
  Administered 2011-08-30 – 2011-09-02 (×12): 0.5 mg via RESPIRATORY_TRACT
  Filled 2011-08-30 (×12): qty 2.5

## 2011-08-30 MED ORDER — LEVALBUTEROL HCL 0.63 MG/3ML IN NEBU
0.6300 mg | INHALATION_SOLUTION | Freq: Four times a day (QID) | RESPIRATORY_TRACT | Status: DC
  Administered 2011-08-30 – 2011-09-02 (×12): 0.63 mg via RESPIRATORY_TRACT
  Filled 2011-08-30 (×15): qty 3

## 2011-08-30 MED ORDER — ALPRAZOLAM 0.5 MG PO TABS
0.5000 mg | ORAL_TABLET | Freq: Three times a day (TID) | ORAL | Status: DC | PRN
  Administered 2011-08-30 – 2011-09-01 (×4): 0.5 mg via ORAL
  Filled 2011-08-30 (×4): qty 1

## 2011-08-30 MED ORDER — INSULIN ASPART 100 UNIT/ML ~~LOC~~ SOLN
3.0000 [IU] | Freq: Three times a day (TID) | SUBCUTANEOUS | Status: DC
  Administered 2011-08-30 – 2011-09-02 (×9): 3 [IU] via SUBCUTANEOUS

## 2011-08-30 NOTE — Progress Notes (Signed)
PCCM PROGRESS NOTE  Chief Complaint: Asthma/COPD exacerbation, Afib with RVR  HPI: Pt is a 66 y/o F with PMHx of moderate persistent asthma, Hx of tobacco abuse, HTN, DL, anxiety who came to the ER 12/29 due to worsening of SOB, cough, sputum production for the alst 2 days, and was found to have a fib with RVR after nebs  Her asthma has been under well control on Advair 250/50 with the last asthma exacrebation about 1 year ago. 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  Evaln pos for flu A & H. Flu, required cardizem drip & mechanical ventilation  DEVICES ETT 12/30 >>1/3  CULTURES FLU A 12/29 PCR -  POSITIVE resp cx 12/29 >> h. flu  TAMIFLU 12/29 (flu a) >>1/3 12/29 roc>> 12/29 zithro>>1/4  BEST PRACTICE Lovenox Rx Dose Protonix    OVERNIGHT Doing well,  Ready for floor tfr      Blood pressure 162/76, pulse 86, temperature 97.7 F (36.5 C), temperature source Axillary, resp. rate 20, height 5\' 2"  (1.575 m), weight 79.5 kg (175 lb 4.3 oz), SpO2 97.00%.   Physical Exam  HEENT: no JVD, no oral trush or postnasal drip. Looks cushingoid.   CV: irregular rhythm, S1 and S2 normal Lungs: decreased air movement. prolonged exp. Phase. NAD Abdomen: soft, non tender, non distended. BS + No peritoneal signs. Extrem: no low extrem edema, no calf tenderness. Neuro:: sedated  Dg Chest Port 1 View  08/30/2011  *RADIOLOGY REPORT*  Clinical Data: COPD  PORTABLE CHEST - 1 VIEW  Comparison: August 29, 2011  Findings: The cardiac silhouette, mediastinum, pulmonary vasculature are within normal limits.  There is chronic interstitial fibrotic change.  No focal infiltrates or effusions are identified.  IMPRESSION: Mild chronic interstitial fibrotic changes.  No evidence of acute cardiac or pulmonary process.  Original Report Authenticated By: Brandon Melnick, M.D.   Dg Chest Port 1 View  08/29/2011  *RADIOLOGY REPORT*  Clinical Data: Respiratory failure, extubation  PORTABLE  CHEST - 1 VIEW  Comparison: Portable chest x-ray of 08/28/2011  Findings: The endotracheal tube has been removed.  Airspace disease remains diffusely.  Heart size is stable.  No bony abnormality is seen.  IMPRESSION: No change in aeration after removal of endotracheal tube.  Little change in airspace disease.  Original Report Authenticated By: Juline Patch, M.D.   BMET    Component Value Date/Time   NA 137 08/30/2011 0254   K 3.6 08/30/2011 0254   CL 97 08/30/2011 0254   CO2 33* 08/30/2011 0254   GLUCOSE 139* 08/30/2011 0254   BUN 22 08/30/2011 0254   CREATININE 0.32* 08/30/2011 0254   CALCIUM 8.7 08/30/2011 0254   GFRNONAA >90 08/30/2011 0254   GFRAA >90 08/30/2011 0254    CBC    Component Value Date/Time   WBC 13.3* 08/30/2011 0254   RBC 4.21 08/30/2011 0254   HGB 12.5 08/30/2011 0254   HCT 38.3 08/30/2011 0254   PLT 280 08/30/2011 0254   MCV 91.0 08/30/2011 0254   MCH 29.7 08/30/2011 0254   MCHC 32.6 08/30/2011 0254   RDW 13.8 08/30/2011 0254   LYMPHSABS 0.6* 08/24/2011 1650   MONOABS 1.2* 08/24/2011 1650   EOSABS 0.0 08/24/2011 1650   BASOSABS 0.0 08/24/2011 1650    ABG    Component Value Date/Time   PHART 7.532* 08/29/2011 0414   PCO2ART 47.0* 08/29/2011 0414   PO2ART 66.6* 08/29/2011 0414   HCO3 39.3* 08/29/2011 0414   TCO2 34.2  08/29/2011 0414   ACIDBASEDEF 0.8 08/23/2011 1600   O2SAT 95.1 08/29/2011 0414       Assessment/Plan Pt is a 66 y/o F with PMHx of moderate persistent asthma, Hx of tobacco abuse, HTN, DL, anxiety with flu A followed by Hemophilus bacterial pneumona & fib with RVR.  1. Asthma/COPD excerbation/Acute Resp Failure due to FLU A & hemophilus pna - Pt has Hx of moderate to persistent asthma, and Hx of tobacco abuse (15 p/yHx)  PLAN  - Exntubated,1/3 - Continue nebulizers  Adjust freq   and ceftx  X 7ds, medrol taper to PO  -. Appears to have a component of COPD  - note compensated hypoercarbia    2. A fib with RVR/ Htn - Lilkely due to acute resp infection/asthma  exacerbation - TSH nml - Started on cardizem, controlled rate BP stable -echo - nml LV fn PLAN - Enoxaparin proph dose - Changed to PO Cardizem per cards , off drip  3. Hyperglycemia - steroid induced -cont   15U lantus while on steroids -adjust novolog and cbg checks to tid and hs  -watch as steroids lowered   4. DVT prophylaxis - Anticoag with Lovenox for now  5. Oliguria - Lasix given 12/31, BUN high - hydrate with fluids -po intake   6. [employer] - plan for HHPT vs inpt rehab, OT consult  Is a boyfriend of 8 years. Her daughter is in [place] at the moment she is 66 years old. She has asked that her daughter make decisions for her in the event she is unable to. Boyfriend has been updated to the above events at the bedside.   Shan Levans PCCM Service   Beeper  [phone]  Cell  [phone]

## 2011-08-31 DIAGNOSIS — J45902 Unspecified asthma with status asthmaticus: Secondary | ICD-10-CM

## 2011-08-31 DIAGNOSIS — J9600 Acute respiratory failure, unspecified whether with hypoxia or hypercapnia: Secondary | ICD-10-CM

## 2011-08-31 DIAGNOSIS — J111 Influenza due to unidentified influenza virus with other respiratory manifestations: Secondary | ICD-10-CM

## 2011-08-31 DIAGNOSIS — I4891 Unspecified atrial fibrillation: Secondary | ICD-10-CM

## 2011-08-31 LAB — GLUCOSE, CAPILLARY: Glucose-Capillary: 102 mg/dL — ABNORMAL HIGH (ref 70–99)

## 2011-08-31 LAB — CBC
Platelets: 239 10*3/uL (ref 150–400)
RBC: 4.15 MIL/uL (ref 3.87–5.11)
WBC: 11.1 10*3/uL — ABNORMAL HIGH (ref 4.0–10.5)

## 2011-08-31 LAB — BASIC METABOLIC PANEL
CO2: 33 mEq/L — ABNORMAL HIGH (ref 19–32)
Calcium: 8.3 mg/dL — ABNORMAL LOW (ref 8.4–10.5)
GFR calc non Af Amer: >90 mL/min (ref >90)
Sodium: 135 mEq/L (ref 135–145)

## 2011-08-31 MED ORDER — PSYLLIUM 95 % PO PACK
1.0000 | PACK | Freq: Every day | ORAL | Status: DC
  Administered 2011-08-31 – 2011-09-02 (×3): 1 via ORAL
  Filled 2011-08-31 (×3): qty 1

## 2011-08-31 MED ORDER — POTASSIUM CHLORIDE CRYS ER 20 MEQ PO TBCR
20.0000 meq | EXTENDED_RELEASE_TABLET | Freq: Two times a day (BID) | ORAL | Status: DC
  Administered 2011-08-31 – 2011-09-02 (×5): 20 meq via ORAL
  Filled 2011-08-31 (×6): qty 1

## 2011-08-31 NOTE — Progress Notes (Signed)
PCCM PROGRESS NOTE  Chief Complaint: Asthma/COPD exacerbation, Afib with RVR  HPI: Pt is a 66 y/o F with PMHx of moderate persistent asthma, Hx of tobacco abuse, HTN, DL, anxiety who came to the ER 12/29 due to worsening of SOB, cough, sputum production for the alst 2 days, and was found to have a fib with RVR after nebs  Her asthma has been under well control on Advair 250/50 with the last asthma exacrebation about 1 year ago. 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  Evaln pos for flu A & H. Flu, required cardizem drip & mechanical ventilation  DEVICES ETT 12/30 >>1/3  CULTURES FLU A 12/29 PCR -  POSITIVE resp cx 12/29 >> h. flu  TAMIFLU 12/29 (flu a) >>1/3 12/29 roc>> 12/29 zithro>>1/4  BEST PRACTICE Lovenox Rx Dose Protonix    OVERNIGHT Family in room. Has had visitors. Now feeding herself. Has been up in chair, not yet up to BR. Denies pain or acute needs. Voice getting better after extubation- no difficulty swallowing.  No venous access- nurse requests PICC Constipated    Blood pressure 147/74, pulse 77, temperature 98.4 F (36.9 C), temperature source Oral, resp. rate 16, height 5\' 2"  (1.575 m), weight 74.1 kg (163 lb 5.8 oz), SpO2 98.00%.   Physical Exam  HEENT: no JVD, no oral trush or postnasal drip. Looks cushingoid.   CV: irregular rhythm, S1 and S2 normal- nearly regular Lungs: decreased air movement. prolonged exp. Phase. NAD. No wheeze or cough demonstrated Abdomen: soft, non tender, non distended. BS + No peritoneal signs. Extrem: no low extrem edema, no calf tenderness. Neuro: awake, oriented  Dg Chest Port 1 View  08/30/2011  *RADIOLOGY REPORT*  Clinical Data: COPD  PORTABLE CHEST - 1 VIEW  Comparison: August 29, 2011  Findings: The cardiac silhouette, mediastinum, pulmonary vasculature are within normal limits.  There is chronic interstitial fibrotic change.  No focal infiltrates or effusions are identified.  IMPRESSION: Mild  chronic interstitial fibrotic changes.  No evidence of acute cardiac or pulmonary process.  Original Report Authenticated By: Brandon Melnick, M.D.   BMET    Component Value Date/Time   NA 135 08/31/2011 0549   K 2.8* 08/31/2011 0549   CL 95* 08/31/2011 0549   CO2 33* 08/31/2011 0549   GLUCOSE 111* 08/31/2011 0549   BUN 19 08/31/2011 0549   CREATININE 0.37* 08/31/2011 0549   CALCIUM 8.3* 08/31/2011 0549   GFRNONAA >90 08/31/2011 0549   GFRAA >90 08/31/2011 0549    CBC    Component Value Date/Time   WBC 11.1* 08/31/2011 0549   RBC 4.15 08/31/2011 0549   HGB 12.4 08/31/2011 0549   HCT 36.9 08/31/2011 0549   PLT 239 08/31/2011 0549   MCV 88.9 08/31/2011 0549   MCH 29.9 08/31/2011 0549   MCHC 33.6 08/31/2011 0549   RDW 13.4 08/31/2011 0549   LYMPHSABS 0.6* 08/24/2011 1650   MONOABS 1.2* 08/24/2011 1650   EOSABS 0.0 08/24/2011 1650   BASOSABS 0.0 08/24/2011 1650    ABG    Component Value Date/Time   PHART 7.532* 08/29/2011 0414   PCO2ART 47.0* 08/29/2011 0414   PO2ART 66.6* 08/29/2011 0414   HCO3 39.3* 08/29/2011 0414   TCO2 34.2 08/29/2011 0414   ACIDBASEDEF 0.8 08/23/2011 1600   O2SAT 95.1 08/29/2011 0414       Assessment/Plan Pt is a 67 y/o F with PMHx of moderate persistent asthma, Hx of tobacco abuse, HTN, DL, anxiety with flu  A followed by Hemophilus bacterial pneumona & fib with RVR.  1. Asthma/COPD excerbation/Acute Resp Failure due to FLU A & hemophilus pna - Pt has Hx of moderate to persistent asthma, and Hx of tobacco abuse (15 p/yHx)  PLAN  - Extubated,1/3 - Continue nebulizers  Adjust freq   and ceftx  X 7ds, prednisone 1/5>> -. Appears to have a component of COPD  - note compensated hypercarbia    2. A fib with RVR/ Htn - [place] due to acute resp infection/asthma exacerbation - TSH nml - Started on cardizem, controlled rate BP stable -echo - nml LV fn PLAN - Enoxaparin proph dose - Changed to PO Cardizem per cards , off drip  3. Hyperglycemia - steroid induced -cont   15U lantus while  on steroids -adjust novolog and cbg checks to tid and hs  -watch as steroids lowered -Needs vascular access-> PICC  4. Hypokalemia K 2.8 1/6 w2/ nl Cr -oral K replacement -BMET 1/7  4. DVT prophylaxis - Anticoag with Lovenox for now  5. Oliguria - Lasix given 12/31, BUN high - hydrate with fluids -po intake   6. [facility] - plan for HHPT vs inpt rehab, OT consult  Is a boyfriend of 8 years. Her daughter is in [place] at the moment she is 64 years old. She has asked that her daughter make decisions for her in the event she is unable to. Boyfriend has been updated to the above events at the bedside.   Shan Levans PCCM Service   Beeper  [phone]  Cell  [phone]

## 2011-08-31 NOTE — Progress Notes (Signed)
08/30/10 NSG:  IV team and ICU and floor nurses unable to find a new IV site.  Pt's IV is out of date (but WNL on assessment) may we have an order for a PICC?  Pt has not had a BM since admission, +bs, tolerating diet, may we have an LOC order?

## 2011-09-01 DIAGNOSIS — J9600 Acute respiratory failure, unspecified whether with hypoxia or hypercapnia: Secondary | ICD-10-CM

## 2011-09-01 DIAGNOSIS — R5381 Other malaise: Secondary | ICD-10-CM

## 2011-09-01 LAB — BASIC METABOLIC PANEL
CO2: 32 mEq/L (ref 19–32)
Glucose, Bld: 78 mg/dL (ref 70–99)
Potassium: 3 mEq/L — ABNORMAL LOW (ref 3.5–5.1)
Sodium: 135 mEq/L (ref 135–145)

## 2011-09-01 LAB — GLUCOSE, CAPILLARY: Glucose-Capillary: 168 mg/dL — ABNORMAL HIGH (ref 70–99)

## 2011-09-01 MED ORDER — PREDNISONE 20 MG PO TABS
40.0000 mg | ORAL_TABLET | Freq: Every day | ORAL | Status: DC
  Administered 2011-09-02: 40 mg via ORAL
  Filled 2011-09-01: qty 2

## 2011-09-01 MED ORDER — MOXIFLOXACIN HCL 400 MG PO TABS
400.0000 mg | ORAL_TABLET | Freq: Every day | ORAL | Status: DC
Start: 2011-09-02 — End: 2011-09-02
  Filled 2011-09-01: qty 1

## 2011-09-01 MED ORDER — POTASSIUM CHLORIDE CRYS ER 20 MEQ PO TBCR
40.0000 meq | EXTENDED_RELEASE_TABLET | Freq: Once | ORAL | Status: AC
  Administered 2011-09-01: 40 meq via ORAL
  Filled 2011-09-01: qty 2

## 2011-09-01 NOTE — Progress Notes (Signed)
Rehab admissions - Evaluated for possible admission.  Please see rehab MD consult recommending inpatient rehab.  I will need OT consult and updated PT notes showing increased participation before I can consider patient for inpatient rehab admit.  I will also need precert from [facility].  Call me for questions.  Pager [phone]

## 2011-09-01 NOTE — Progress Notes (Signed)
Physical Therapy Treatment Patient Details Name: AJAHNAE RATHGEBER MRN: [id number] DOB: 29-Apr-1946 Today's Date: 09/01/2011  ARF Asthma COPD  10:55 - 11:25 1 gt  1 ta PT Assessment/Plan  PT - Assessment/Plan Comments on Treatment Session: "I'll try" stated pt.  Pt plans to D/C to CIR for rehab if unable to go home.  Pt prefers to go home but will go to CIR if neccessary. PT Plan: Discharge plan remains appropriate Follow Up Recommendations: Home health PT Equipment Recommended: None recommended by PT PT Goals  Acute Rehab PT Goals PT Goal Formulation: With patient Pt will go Supine/Side to Sit: with supervision PT Goal: Supine/Side to Sit - Progress: Progressing toward goal Pt will go Sit to Supine/Side: with supervision PT Goal: Sit to Supine/Side - Progress: Progressing toward goal Pt will go Sit to Stand: with min assist PT Goal: Sit to Stand - Progress: Progressing toward goal Pt will Transfer Bed to Chair/Chair to Bed: with min assist PT Transfer Goal: Bed to Chair/Chair to Bed - Progress: Progressing toward goal Pt will Ambulate: 51 - 150 feet;with least restrictive assistive device;with min assist PT Goal: Ambulate - Progress: Progressing toward goal Pt will Go Up / Down Stairs: 6-9 stairs;with min assist PT Goal: Up/Down Stairs - Progress: Progressing toward goal Pt will Perform Home Exercise Program: with supervision, verbal cues required/provided PT Goal: Perform Home Exercise Program - Progress: Progressing toward goal  PT Treatment Precautions/Restrictions  Precautions Precautions: Fall Restrictions Weight Bearing Restrictions: No Mobility (including Balance) Bed Mobility Bed Mobility: Yes Supine to Sit: 4: Min assist;HOB elevated (Comment degrees) (HOB elevated 45' and increased time) Supine to Sit Details (indicate cue type and reason): HOB increased 45' and increased time Transfers Transfers: Yes Sit to Stand: 1: +2 Total assist;From bed Sit to Stand  Details (indicate cue type and reason): total assist + 2 pt 65% with 75% VC's on hand placement and increased time Stand to Sit: 2: Max assist;To chair/3-in-1 Stand to Sit Details: 75% VC's on hand placement as pt demon uncontrolled decend 2nd fatigue Ambulation/Gait Ambulation/Gait: Yes Ambulation/Gait Assistance: 1: +2 Total assist Ambulation/Gait Assistance Details (indicate cue type and reason): Total assist + 2 pt 65% very limited act tolerance, shaky unsteady gait with 4/4 DOE.  Amb twice 6' then 4' on 3 lts On stats avg 92% chair following behind for safety and very limited act toleance. Ambulation Distance (Feet): 10 Feet Assistive device: Rolling walker Gait Pattern: Shuffle Gait velocity: very shaky unsteady gait with mild anxiety with activity Stairs: No Wheelchair Mobility Wheelchair Mobility: No    VITALS Prior to act:155/88, HR 86, 97% on 3 lts During act: 168/98, HR 109, 95% on 3 lts After act: 165/91, HR 94, 93% on 3 lts   End of Session PT - End of Session Equipment Utilized During Treatment: Gait belt Activity Tolerance: Treatment limited secondary to medical complications (Comment) (demon 4/4 DOE and limited act tolerance) Patient left: in chair;with call bell in reach;with family/visitor present Nurse Communication: Mobility status for ambulation General Behavior During Session: [facility] for tasks performed Cognition: [facility] for tasks performed  Felecia Shelling  PTA [facility]  Acute  Rehab Pager     [phone]

## 2011-09-01 NOTE — Progress Notes (Signed)
Occupational Therapy Evaluation Patient Details Name: [name] MRN: [id number] DOB: 16-May-1946 Today's Date: 09/01/2011  Problem List:  Patient Active Problem List  Diagnoses  . HYPERCHOLESTEROLEMIA  . ANXIETY  . HYPERTENSION  . ASTHMA, MODERATE  . BACK PAIN, LUMBAR  . PERS HX NONCOMPLIANCE W/MED TX PRS HAZARDS HLTH    Past Medical History:  Past Medical History  Diagnosis Date  . Moderate asthma   . Hypertension   . Hypercholesteremia   . Lumbar back pain   . Anxiety    Past Surgical History:  Past Surgical History  Procedure Date  . Tonsillectomy and adnoidectomy   . Cholecystectomy   . Left thumb cmc arthroplasty 11/2010    by DrGramig    OT Assessment/Plan/Recommendation OT Assessment Clinical Impression Statement: Pt presents to OT with decreased I with ADL activity due to dyspnea and decreased strength and endurance.  Pt will benefit from OT to increase I and return to PLOF (working at [employer]) OT Recommendation/Assessment: Patient will need skilled OT in the acute care venue OT Problem List: Decreased strength;Decreased activity tolerance;Cardiopulmonary status limiting activity;Decreased knowledge of use of DME or AE OT Therapy Diagnosis : Generalized weakness OT Plan OT Frequency: Min 2X/week OT Treatment/Interventions: Self-care/ADL training;DME and/or AE instruction;Patient/family education;Energy conservation OT Recommendation Recommendations for Other Services: Rehab consult Equipment Recommended: Defer to next venue Individuals Consulted Consulted and Agree with Results and Recommendations: Patient OT Goals Acute Rehab OT Goals Time For Goal Achievement: 2 weeks  OT Evaluation Precautions/Restrictions  Precautions Precautions: Fall Restrictions Weight Bearing Restrictions: No Prior Functioning Home Living Receives Help From:  (daugther in town from [place]) Type of Home: Apartment Bathroom Shower/Tub: Engineer, manufacturing systems:  Standard Home Adaptive Equipment: None Prior Function Level of Independence: Independent with basic ADLs ADL ADL Grooming: Simulated Where Assessed - Grooming: Standing at sink;Other (comment) (standing in front of recliner) Upper Body Bathing: Simulated;Moderate assistance Where Assessed - Upper Body Bathing: Unsupported;Sitting, chair Lower Body Bathing: Simulated;Maximal assistance Where Assessed - Lower Body Bathing: Sit to stand from chair Upper Body Dressing: Simulated;Moderate assistance Where Assessed - Lower Body Dressing: Unsupported;Sitting, chair Toilet Transfer: Simulated Toilet Transfer Method: Other (comment) (sit to stand from chair) Toileting - Clothing Manipulation: Simulated;Moderate assistance Where Assessed - Toileting Clothing Manipulation: Standing Toileting - Hygiene: Simulated;Moderate assistance Where Assessed - Toileting Hygiene: Standing ADL Comments: fatigue and anxiety very limiting. Pt will need significant rehab to increase I and return to PLOF Vision/Perception  Vision - History Baseline Vision: No visual deficits Cognition Cognition Overall Cognitive Status: Appears within functional limits for tasks assessed Orientation Level: Oriented X4    Extremity Assessment RUE Assessment RUE Assessment: Within Functional Limits LUE Assessment LUE Assessment: Within Functional Limits Mobility  Bed Mobility Bed Mobility: Yes Supine to Sit: 4: Min assist;HOB elevated (Comment degrees) (HOB elevated 45' and increased time) Supine to Sit Details (indicate cue type and reason): HOB increased 45' and increased time Transfers Sit to Stand: 1: +2 Total assist;From bed Sit to Stand Details (indicate cue type and reason): total assist + 2 pt 65% with 75% VC's on hand placement and increased time Stand to Sit: 2: Max assist;To chair/3-in-1 Stand to Sit Details: 75% VC's on hand placement as pt demon uncontrolled decend 2nd fatigue    End of Session OT - End  of Session Activity Tolerance: Patient limited by fatigue Patient left: in bed General Behavior During Session: [facility] for tasks performed Cognition: [facility] for tasks performed (pt very anxious during OT tx)  Einar Crow [facility] 09/01/2011, 3:40 PM

## 2011-09-01 NOTE — Progress Notes (Signed)
Rehab admissions - I met with patient and her daughter.  They would like inpatient rehab.  I have called and faxed information to [facility] insurance for review.  If MD feels patient is medically stable, can potentially admit to inpatient rehab tomorrow once I get insurance authorization.  Will follow up in am for plans.  Pager [phone]

## 2011-09-01 NOTE — Consult Note (Signed)
Physical Medicine and Rehabilitation Consult Reason for Consult: Deconditioning/COPD exacerbation Referring Phsyician: Dr. Alphia Moh is an 66 y.o. female.   HPI: 66 year old right-handed white female with past medical history of moderate persistent asthma as well as history of tobacco abuse. 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 Admitted December 29 with worsening shortness of breath, cough, sputum production for 2 days as well as fever of 102. She received albuterol treatment and found to have atrial fibrillation with rapid ventricular rate with heart rate the 200s. She was given adenosine x2 and placed on Cardizem drip. Chest x-ray showed worsening perihilar and infrahilar bronchitic changes versus early pneumonitis. She did require intubation. Placed on broad-spectrum antibiotics including Tamiflu. Followup pulmonary services for chronic obstructive pulmonary disease exacerbation with acute respiratory failure due to flu and a Hemophilus pneumonia. Subcutaneous Lovenox added for deep vein thrombosis prophylaxis. She remains on prednisone 60 mg daily with plan for taper. Cardiac rate controlled on Cardizem 90 mg every 6 hours. Currently on Rocephin 1 g at bedtime for pneumonia. Blood sugars was some elevation felt to be induced secondary to steroids and placed on Lantus insulin. Physical therapy evaluation completed on January 4 she required +2 total assist for sit to supine. She currently had refused transfer training secondary to being fearful and fatigue ambulation was yet to be tested. Physical medicine and rehabilitation was consulted per physical therapy request to consider inpatient rehabilitation services  Review of Systems  Constitutional: Positive for malaise/fatigue.  Eyes: Negative for double vision.  Respiratory: Positive for cough and shortness of breath.   Cardiovascular: Negative for chest pain.  Gastrointestinal: Positive for  constipation. Negative for nausea.  Genitourinary: Negative for dysuria.  Musculoskeletal: Positive for myalgias.  Skin: Negative.   Neurological: Negative for dizziness and headaches.  Psychiatric/Behavioral: Negative.    Past Medical History  Diagnosis Date  . Moderate asthma   . Hypertension   . Hypercholesteremia   . Lumbar back pain   . Anxiety    Past Surgical History  Procedure Date  . Tonsillectomy and adnoidectomy   . Cholecystectomy   . Left thumb cmc arthroplasty 11/2010    by DrGramig   History reviewed. No pertinent family history. Social History:  reports that she quit smoking about 9 years ago. Her smoking use included Cigarettes. She does not have any smokeless tobacco history on file. She reports that she drinks alcohol. Her drug history not on file. Allergies:  Allergies  Allergen Reactions  . Codeine     REACTION: nausea   Medications Prior to Admission  Medication Dose Route Frequency Provider Last Rate Last Dose  . 0.9 %  sodium chloride infusion  250 mL Intravenous PRN Vilinda Blanks Minor, NP 10 mL/hr at 08/29/11 1038 250 mL at 08/29/11 1038  . acetaminophen (TYLENOL) tablet 650 mg  650 mg Oral Q6H PRN Shan Levans, MD   650 mg at 08/29/11 1615  . adenosine (ADENOCARD) 6 MG/2ML injection           . ALPRAZolam Prudy Feeler) tablet 0.5 mg  0.5 mg Oral TID PRN Shan Levans, MD   0.5 mg at 09/01/11 0100  . antiseptic oral rinse (BIOTENE) solution 15 mL  15 mL Mouth Rinse q12n4p Juan Rojas   15 mL at 08/31/11 1600  . aspirin chewable tablet 324 mg  324 mg Oral NOW Juan Rojas   324 mg at 08/23/11 0050   Or  . aspirin suppository 300 mg  300  mg Rectal NOW Orbie Hurst      . cefTRIAXone (ROCEPHIN) 1 g in dextrose 5 % 50 mL IVPB  1 g Intravenous QHS Juan Rojas   1 g at 08/31/11 2157  . chlorhexidine (PERIDEX) 0.12 % solution 15 mL  15 mL Mouth Rinse BID Juan Rojas   15 mL at 08/31/11 0843  . diltiazem (CARDIZEM) 100 mg in dextrose 5 % 100 mL infusion  5 mg/hr  Intravenous Once [employer]. Rubin Payor, MD   5 mg/hr at 08/22/11 2014  . diltiazem (CARDIZEM) injection SOLN 10 mg  10 mg Intravenous Once [employer]. Pickering, MD   10 mg at 08/22/11 1829  . diltiazem (CARDIZEM) injection SOLN 10 mg  10 mg Intravenous Once [employer]. Pickering, MD   10 mg at 08/22/11 1915  . diltiazem (CARDIZEM) injection SOLN 20 mg  20 mg Intravenous Once [employer]. Pickering, MD   20 mg at 08/22/11 2010  . diltiazem (CARDIZEM) tablet 90 mg  90 mg Oral Q6H Peter [place], MD   90 mg at 09/01/11 0058  . diphenhydrAMINE (BENADRYL) capsule 25 mg  25 mg Oral QHS PRN Shan Levans, MD   25 mg at 08/29/11 2111  . enoxaparin (LOVENOX) injection 40 mg  40 mg Subcutaneous Q24H Peter [place], MD   40 mg at 08/31/11 1727  . etomidate (AMIDATE) 2 MG/ML injection           . etomidate (AMIDATE) injection 20 mg  20 mg Intravenous Once Kalman Shan, MD   20 mg at 08/24/11 1604  . fentaNYL (SUBLIMAZE) injection 100 mcg  100 mcg Intravenous Once Kalman Shan, MD   100 mcg at 08/24/11 1545  . furosemide (LASIX) injection 20 mg  20 mg Intravenous Once Kalman Shan, MD   20 mg at 08/23/11 1358  . furosemide (LASIX) injection 40 mg  40 mg Intravenous Once Shan Levans, MD   40 mg at 08/25/11 2057  . hydrALAZINE (APRESOLINE) injection 10-20 mg  10-20 mg Intravenous Q4H PRN Rakesh V. Vassie Loll, MD   10 mg at 08/30/11 2148  . insulin aspart (novoLOG) injection 0-20 Units  0-20 Units Subcutaneous TID WC Shan Levans, MD   4 Units at 08/31/11 1728  . insulin aspart (novoLOG) injection 3 Units  3 Units Subcutaneous TID WC Shan Levans, MD   3 Units at 08/31/11 1727  . insulin glargine (LANTUS) injection 15 Units  15 Units Subcutaneous Daily Rakesh V. Vassie Loll, MD   15 Units at 08/31/11 1011  . ipratropium (ATROVENT) nebulizer solution 0.5 mg  0.5 mg Nebulization Q6H Shan Levans, MD   0.5 mg at 09/01/11 0149  . levalbuterol (XOPENEX) nebulizer solution 0.63 mg  0.63 mg Nebulization Once [employer].  Pickering, MD   0.63 mg at 08/22/11 2041  . levalbuterol (XOPENEX) nebulizer solution 0.63 mg  0.63 mg Nebulization Q3H PRN Vilinda Blanks Minor, NP      . levalbuterol (XOPENEX) nebulizer solution 0.63 mg  0.63 mg Nebulization Q6H Shan Levans, MD   0.63 mg at 09/01/11 0149  . lidocaine (cardiac) 100 mg/50ml (XYLOCAINE) 20 MG/ML injection 2%           . magnesium sulfate IVPB 2 g 50 mL  2 g Intravenous STAT Kalman Shan, MD   2 g at 08/23/11 0911  . midazolam (VERSED) 5 MG/ML injection        4 mg at 08/24/11 1603  . morphine 2 MG/ML injection 2 mg  2 mg Intravenous Once Kalman Shan,  MD   2 mg at 08/24/11 1402  . oseltamivir (TAMIFLU) capsule 75 mg  75 mg Oral BID Kalman Shan, MD   75 mg at 08/27/11 2200  . potassium chloride SA (K-DUR,KLOR-CON) CR tablet 20 mEq  20 mEq Oral BID Waymon Budge, MD   20 mEq at 08/31/11 2156  . potassium chloride SA (K-DUR,KLOR-CON) CR tablet 40 mEq  40 mEq Oral Once Kalman Shan, MD   40 mEq at 08/23/11 1410  . predniSONE (DELTASONE) 60 mg  60 mg Oral Q breakfast Shan Levans, MD   60 mg at 08/31/11 0843  . predniSONE (DELTASONE) tablet 40 mg  40 mg Oral Once [employer]. Pickering, MD   40 mg at 08/22/11 2019  . psyllium (HYDROCIL/METAMUCIL) packet 1 packet  1 packet Oral Daily Waymon Budge, MD   1 packet at 08/31/11 1343  . rocuronium (ZEMURON) 50 MG/5ML injection           . rocuronium (ZEMURON) injection 70 mg  70 mg Intravenous Once Kalman Shan, MD   70 mg at 08/24/11 1604  . sodium chloride 0.9 % bolus 500 mL  500 mL Intravenous Once Harrold Donath R. Pickering, MD   500 mL at 08/22/11 2320  . succinylcholine (ANECTINE) 20 MG/ML injection           . zafirlukast (ACCOLATE) tablet 160 mg  160 mg Oral Once Kalman Shan, MD   160 mg at 08/23/11 0909  . DISCONTD: 0.9 %  sodium chloride infusion   Intravenous Continuous Juliet Rude. Pickering, MD 125 mL/hr at 08/24/11 0600    . DISCONTD: 0.9 %  sodium chloride infusion  250 mL Intravenous PRN Orbie Hurst 20 mL/hr at 08/24/11 2000    . DISCONTD: 0.9 %  sodium chloride infusion  250 mL Intravenous PRN Rakesh V. Vassie Loll, MD 75 mL/hr at 08/27/11 0630 250 mL at 08/27/11 0630  . DISCONTD: 0.9 %  sodium chloride infusion  250 mL Intravenous PRN Rakesh V. Vassie Loll, MD 50 mL/hr at 08/28/11 1446 1,000 mL at 08/28/11 1446  . DISCONTD: albuterol (PROVENTIL) (5 MG/ML) 0.5% nebulizer solution 2.5 mg  2.5 mg Nebulization Q2H PRN Kalman Shan, MD      . DISCONTD: azithromycin (ZITHROMAX) tablet 500 mg  500 mg Oral Daily Juan Rojas   500 mg at 08/29/11 1033  . DISCONTD: cloNIDine (CATAPRES - Dosed in mg/24 hr) patch 0.1 mg  0.1 mg Transdermal Weekly Gerrit Friends. Rothbart, MD   0.1 mg at 08/23/11 1010  . DISCONTD: diltiazem (CARDIZEM) 100 mg in dextrose 5 % 100 mL infusion  10 mg/hr Intravenous Once [employer]. Pickering, MD 10 mL/hr at 08/23/11 0400 10 mg/hr at 08/23/11 0400  . DISCONTD: diltiazem (CARDIZEM) 100 mg in dextrose 5 % 100 mL infusion  5 mg/hr Intravenous Once Kalman Shan, MD      . DISCONTD: diltiazem (CARDIZEM) 100 mg in dextrose 5 % 100 mL infusion  5-15 mg/hr Intravenous Continuous Gerrit Friends. Rothbart, MD 10 mL/hr at 08/24/11 0700 10 mg/hr at 08/24/11 0700  . DISCONTD: diltiazem (CARDIZEM) 100 mg in dextrose 5 % 100 mL infusion  15 mg/hr Intravenous Continuous Gerrit Friends. Rothbart, MD 15 mL/hr at 08/26/11 0231 15 mg/hr at 08/26/11 0231  . DISCONTD: enoxaparin (LOVENOX) injection 70 mg  70 mg Subcutaneous Q12H Juan Rojas   70 mg at 08/27/11 0010  . DISCONTD: enoxaparin (LOVENOX) injection 75 mg  1 mg/kg Subcutaneous Once Harrold Donath R. Rubin Payor, MD   75 mg at 08/22/11  2330  . DISCONTD: feeding supplement (JEVITY 1.2) liquid 1,000 mL  1,000 mL Per Tube Continuous Rakesh V. Vassie Loll, MD      . DISCONTD: feeding supplement (OXEPA) liquid 1,000 mL  1,000 mL Per Tube Continuous Rakesh V. Vassie Loll, MD 40 mL/hr at 08/27/11 1717 1,000 mL at 08/27/11 1717  . DISCONTD: feeding supplement (PRO-STAT SUGAR FREE 64) liquid 30 mL   30 mL Oral TID WC Hoyt Koch, RD   30 mL at 08/27/11 1704  . DISCONTD: fentaNYL (SUBLIMAZE) 10 mcg/mL in sodium chloride 0.9 % 250 mL infusion  50-400 mcg/hr Intravenous Titrated Kalman Shan, MD   50 mcg/hr at 08/28/11 0400  . DISCONTD: fentaNYL (SUBLIMAZE) bolus via infusion 50-100 mcg  50-100 mcg Intravenous Q6H PRN Kalman Shan, MD   75 mcg at 08/27/11 1541  . DISCONTD: heparin injection 5,000 Units  5,000 Units Subcutaneous Q8H Orbie Hurst      . DISCONTD: insulin aspart (novoLOG) injection 0-20 Units  0-20 Units Subcutaneous Q4H Rakesh V. Vassie Loll, MD   4 Units at 08/30/11 [phone]  . DISCONTD: insulin aspart (novoLOG) injection 3 Units  3 Units Subcutaneous Q4H Rakesh V. Vassie Loll, MD   3 Units at 08/30/11 0800  . DISCONTD: insulin glargine (LANTUS) injection 10 Units  10 Units Subcutaneous Daily Rakesh V. Vassie Loll, MD   10 Units at 08/27/11 1004  . DISCONTD: ipratropium (ATROVENT) nebulizer solution 0.5 mg  0.5 mg Nebulization Q6H Juan Rojas   0.5 mg at 08/22/11 2359  . DISCONTD: ipratropium (ATROVENT) nebulizer solution 0.5 mg  0.5 mg Nebulization Q3H Kalman Shan, MD   0.5 mg at 08/30/11 0734  . DISCONTD: levalbuterol (XOPENEX) nebulizer solution 0.63 mg  0.63 mg Nebulization Q6H Orbie Hurst      . DISCONTD: levalbuterol (XOPENEX) nebulizer solution 0.63 mg  0.63 mg Nebulization Q6H PRN Juan Rojas   0.63 mg at 08/23/11 0409  . DISCONTD: levalbuterol (XOPENEX) nebulizer solution 0.63 mg  0.63 mg Nebulization Q6H Juan Rojas   0.63 mg at 08/22/11 2359  . DISCONTD: levalbuterol (XOPENEX) nebulizer solution 0.63 mg  0.63 mg Nebulization Q3H Kalman Shan, MD   0.63 mg at 08/30/11 0735  . DISCONTD: methylPREDNISolone sodium succinate (SOLU-MEDROL) 125 MG injection 60 mg  60 mg Intravenous Q8H Juan Rojas   60 mg at 08/28/11 0515  . DISCONTD: methylPREDNISolone sodium succinate (SOLU-MEDROL) 40 MG injection 40 mg  40 mg Intravenous Q8H Rakesh V. Vassie Loll, MD   40 mg at 08/29/11 1314  .  DISCONTD: methylPREDNISolone sodium succinate (SOLU-MEDROL) 40 MG injection 40 mg  40 mg Intravenous Q12H Rakesh V. Vassie Loll, MD   40 mg at 08/30/11 0120  . DISCONTD: midazolam (VERSED) 1 mg/mL in sodium chloride 0.9 % 50 mL infusion  2-10 mg/hr Intravenous Titrated Kalman Shan, MD   1 mg/hr at 08/27/11 1900  . DISCONTD: midazolam (VERSED) bolus via infusion 1-2 mg  1-2 mg Intravenous Q2H PRN Kalman Shan, MD      . DISCONTD: midazolam (VERSED) injection 4 mg  4 mg Intravenous Once Kalman Shan, MD      . DISCONTD: pantoprazole (PROTONIX) EC tablet 40 mg  40 mg Oral Q1200 Kalman Shan, MD   40 mg at 08/24/11 1200  . DISCONTD: pantoprazole sodium (PROTONIX) 40 mg/20 mL oral suspension 40 mg  40 mg Per Tube Q1200 Kalman Shan, MD   40 mg at 08/27/11 1131   Medications Prior to Admission  Medication Sig Dispense Refill  . albuterol (PROVENTIL HFA;VENTOLIN HFA) 108 (90 BASE)  MCG/ACT inhaler Inhale 2 puffs into the lungs every 6 (six) hours as needed for wheezing.  3 Inhaler  4  . Fluticasone-Salmeterol (ADVAIR DISKUS) 250-50 MCG/DOSE AEPB Inhale 1 puff into the lungs 2 (two) times daily.  3 each  3  . losartan-hydrochlorothiazide (HYZAAR) 100-12.5 MG per tablet Take 1 tablet by mouth daily.  90 tablet  3    Home: Home Living Lives With: Alone Type of Home: Apartment Home Layout: One level Home Access: Stairs to enter Entrance Stairs-Rails: Right Entrance Stairs-Number of Steps: 20 Home Adaptive Equipment: None  Functional History: Prior Function Level of Independence: Independent with basic ADLs;Independent with gait;Independent with homemaking with ambulation Driving: Yes Vocation: Full time employment Functional Status:  Mobility: Bed Mobility Bed Mobility: Yes Supine to Sit: 3: Mod assist;HOB elevated (Comment degrees);With rails Supine to Sit Details (indicate cue type and reason): VCs safety, technique, hand placement. Increased time.  Assist for LEs off bed and  trunk to upright. 2 attempts due to pt c/o dizziness on 1st attempt.  Sit to Supine - Right: 1: +2 Total assist;HOB elevated (comment degrees);With rail Sit to Supine - Right Details (indicate cue type and reason): Vcs safety, technique, hand placement. Increased time. Assist to control trunk and get LEs back onto bed. Transfers Transfers: No (pt refused/declined-fearful and fatigued) Ambulation/Gait Ambulation/Gait: No    ADL:    Cognition: Cognition Arousal/Alertness: Lethargic Orientation Level: Oriented X4 Cognition Arousal/Alertness: Lethargic Overall Cognitive Status: Appears within functional limits for tasks assessed Orientation Level: Oriented X4  Blood pressure 161/88, pulse 80, temperature 98.2 F (36.8 C), temperature source Oral, resp. rate 18, height 5\' 2"  (1.575 m), weight 74.1 kg (163 lb 5.8 oz), SpO2 95.00%. Physical Exam  Constitutional: She is oriented to person, place, and time. She appears well-developed.  HENT:  Head: Normocephalic.  Eyes: Pupils are equal, round, and reactive to light.  Neck: Normal range of motion. Neck supple. No thyromegaly present.  Cardiovascular: Normal rate and regular rhythm.        Cardiac rate controlled  Pulmonary/Chest: Effort normal. She has no wheezes.  Abdominal: She exhibits no distension. There is no tenderness.  Musculoskeletal: Normal range of motion. She exhibits edema.  Neurological: She is alert and oriented to person, place, and time. No cranial nerve deficit.       Upper extremity strength 3+ to 4/5 prox to 4/5 distal.  Tremor occasionally when she fatigued during a movement.  LE 2+/5 prox to 3+/5 distally.  No sensor deficits. DTR's 2+.  Cognitively intact.  Skin: Skin is warm and dry.  Psychiatric: Her behavior is normal.    Results for orders placed during the hospital encounter of 08/22/11 (from the past 24 hour(s))  GLUCOSE, CAPILLARY     Status: Abnormal   Collection Time   08/31/11  7:31 AM      Component  Value Range   Glucose-Capillary 102 (*) 70 - 99 (mg/dL)   Comment 1 Documented in Chart     Comment 2 Notify RN    GLUCOSE, CAPILLARY     Status: Abnormal   Collection Time   08/31/11 11:37 AM      Component Value Range   Glucose-Capillary 140 (*) 70 - 99 (mg/dL)   Comment 1 Documented in Chart     Comment 2 Notify RN    GLUCOSE, CAPILLARY     Status: Abnormal   Collection Time   08/31/11  4:52 PM      Component Value Range  Glucose-Capillary 249 (*) 70 - 99 (mg/dL)   Comment 1 Documented in Chart     Comment 2 Notify RN    GLUCOSE, CAPILLARY     Status: Abnormal   Collection Time   08/31/11  8:48 PM      Component Value Range   Glucose-Capillary 102 (*) 70 - 99 (mg/dL)  BASIC METABOLIC PANEL     Status: Abnormal   Collection Time   09/01/11  5:20 AM      Component Value Range   Sodium 135  135 - 145 (mEq/L)   Potassium 3.0 (*) 3.5 - 5.1 (mEq/L)   Chloride 98  96 - 112 (mEq/L)   CO2 32  19 - 32 (mEq/L)   Glucose, Bld 78  70 - 99 (mg/dL)   BUN 15  6 - 23 (mg/dL)   Creatinine, Ser 1.61 (*) 0.50 - 1.10 (mg/dL)   Calcium 7.9 (*) 8.4 - 10.5 (mg/dL)   GFR calc non Af Amer >90  >90 (mL/min)   GFR calc Af Amer >90  >90 (mL/min)   No results found.  Assessment/Plan: Diagnosis: severe deconditioning/ critical illness myopathy 1. Does the need for close, 24 hr/day medical supervision in concert with the patient's rehab needs make it unreasonable for this patient to be served in a less intensive setting? Yes 2. Co-Morbidities requiring supervision/potential complications: htn, asthma, anxiety, COPD 3. Due to bladder management, bowel management, safety, skin/wound care, disease management, medication administration, pain management and patient education, does the patient require 24 hr/day rehab nursing? Yes 4. Does the patient require coordinated care of a physician, rehab nurse, PT (1-2 hrs/day, 5 days/week) and OT (1-2 hrs/day, 5 days/week) to address physical and functional deficits in  the context of the above medical diagnosis(es)? Yes Addressing deficits in the following areas: balance, bathing, bowel/bladder control, dressing, endurance, grooming, locomotion, psychosocial adjustment, strength, toileting and transferring 5. Can the patient actively participate in an intensive therapy program of at least 3 hrs of therapy per day at least 5 days per week? Yes 6. The potential for patient to make measurable gains while on inpatient rehab is excellent 7. Anticipated functional outcomes upon discharge from inpatients are supervision to min assist PT, supervision to min assist OT 8. Estimated rehab length of stay to reach the above functional goals is: 2 plus weeks 9. Does the patient have adequate social supports to accommodate these discharge functional goals? Yes 10. Anticipated D/C setting: Home 11. Anticipated post D/C treatments: HH therapy 12. Overall Rehab/Functional Prognosis: excellent  RECOMMENDATIONS: This patient's condition is appropriate for continued rehabilitative care in the following setting: CIR Patient has agreed to participate in recommended program. Yes Note that insurance prior authorization may be required for reimbursement for recommended care.  Comment: Pt has 20 steps to get (down) into her apartment.  Can potentially stay with boyfriend in [place]. Daughter also involved.  Pt was active and working PTA.  Should do quite nicely.  Rehab RN to f/u.   ZTS 09/01/2011

## 2011-09-01 NOTE — Progress Notes (Signed)
09-01-11  NSG:  05:23 pt has 6 beats V-tach and then reutrns to sr in the 80's.  Pt is asymptomatic and VSS. Strip placed in chart and will continue to monitor

## 2011-09-01 NOTE — Progress Notes (Signed)
PCCM PROGRESS NOTE  Chief Complaint: Asthma/COPD exacerbation, Afib with RVR  HPI: Pt is a 66 y/o F with PMHx of moderate persistent asthma, Hx of tobacco abuse, HTN, DL, anxiety who came to the ER 12/29 due to worsening of SOB, cough, sputum production for the alst 2 days, and was found to have a fib with RVR after nebs  Her asthma has been under well control on Advair 250/50 with the last asthma exacrebation about 1 year ago. 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  Evaln pos for flu A & H. Flu, required cardizem drip & mechanical ventilation  DEVICES ETT 12/30 >>1/3  CULTURES FLU A 12/29 PCR -  POSITIVE resp cx 12/29 >> h. flu  TAMIFLU 12/29 (flu a) >>1/3 12/29 roc>> 12/29 zithro>>1/4  [facility] Lovenox Rx Dose Protonix    OVERNIGHT Family in room. Has had visitors. Now feeding herself. Has been up in chair, not yet up to BR. Denies pain or acute needs. Voice getting better after extubation- no difficulty swallowing.  No venous access- nurse requests PICC Constipated    Blood pressure 169/95, pulse 78, temperature 97.8 F (36.6 C), temperature source Axillary, resp. rate 19, height 5\' 2"  (1.575 m), weight 163 lb 5.8 oz (74.1 kg), SpO2 96.00%.   Physical Exam  HEENT: no JVD, no oral trush or postnasal drip. Looks cushingoid.   CV: irregular rhythm, S1 and S2 normal- nearly regular Lungs: decreased air movement. prolonged exp. Phase. NAD. No wheeze or cough demonstrated Abdomen: soft, non tender, non distended. BS + No peritoneal signs. Extrem: no low extrem edema, no calf tenderness. Neuro: awake, oriented  No results found. BMET    Component Value Date/Time   NA 135 09/01/2011 0520   K 3.0* 09/01/2011 0520   CL 98 09/01/2011 0520   CO2 32 09/01/2011 0520   GLUCOSE 78 09/01/2011 0520   BUN 15 09/01/2011 0520   CREATININE 0.40* 09/01/2011 0520   CALCIUM 7.9* 09/01/2011 0520   GFRNONAA >90 09/01/2011 0520   GFRAA >90 09/01/2011 0520    CBC      Component Value Date/Time   WBC 11.1* 08/31/2011 0549   RBC 4.15 08/31/2011 0549   HGB 12.4 08/31/2011 0549   HCT 36.9 08/31/2011 0549   PLT 239 08/31/2011 0549   MCV 88.9 08/31/2011 0549   MCH 29.9 08/31/2011 0549   MCHC 33.6 08/31/2011 0549   RDW 13.4 08/31/2011 0549   LYMPHSABS 0.6* 08/24/2011 1650   MONOABS 1.2* 08/24/2011 1650   EOSABS 0.0 08/24/2011 1650   BASOSABS 0.0 08/24/2011 1650    ABG    Component Value Date/Time   PHART 7.532* 08/29/2011 0414   PCO2ART 47.0* 08/29/2011 0414   PO2ART 66.6* 08/29/2011 0414   HCO3 39.3* 08/29/2011 0414   TCO2 34.2 08/29/2011 0414   ACIDBASEDEF 0.8 08/23/2011 1600   O2SAT 95.1 08/29/2011 0414       Assessment/Plan Pt is a 66 y/o F with PMHx of moderate persistent asthma, Hx of tobacco abuse, HTN, DL, anxiety with flu A followed by Hemophilus bacterial pneumona & fib with RVR.  1. Asthma/COPD excerbation/Acute Resp Failure due to FLU A & hemophilus pna - Pt has Hx of moderate to persistent asthma, and Hx of tobacco abuse (15 p/yHx) -wean fio2 -change abx to po PLAN  - Extubated,1/3 - Continue nebulizers  Adjust freq   and ceftx  X 7ds, prednisone 1/5>> -. Appears to have a component of COPD  - note  compensated hypercarbia    2. A fib with RVR/ Htn - [place] due to acute resp infection/asthma exacerbation - TSH nml - Started on cardizem, controlled rate BP stable -echo - nml LV fn PLAN - Enoxaparin proph dose - Changed to PO Cardizem per cards , off drip  3. Hyperglycemia - steroid induced -cont   15U lantus while on steroids -adjust novolog and cbg checks to tid and hs  -watch as steroids lowered  4. Hypokalemia  Lab 09/01/11 0520 08/31/11 0549 08/30/11 0254 08/29/11 0307 08/28/11 0310 08/26/11 0307  NA 135 135 137 144 146* --  K 3.0* 2.8* -- -- -- --  CL 98 95* 97 101 107 --  CO2 32 33* 33* 38* 35* --  GLUCOSE 78 111* 139* 151* 190* --  BUN 15 19 22  32* 40* --  CREATININE 0.40* 0.37* 0.32* 0.36* 0.44* --  CALCIUM 7.9* 8.3* 8.7 8.6  8.7 --  MG -- -- -- -- -- 3.0*  PHOS -- -- -- -- -- 3.3    -oral K replacement -BMET 1/8 -d/c foley   4. DVT prophylaxis -LWM[facility]   6. [employer] - plan for HHPT vs inpt rehab, OT consult Pt ok for Inpt rehab. She could tfr in am 09/02/11  Brett Canales Minor ACNP [facility] PCCM Pager [phone] till 3 pm If no answer page [phone] 09/01/2011, 1:23 PM         I have seen and examined this patient with the nurse practionner and agree with the above assessment and plan.  I have made notations to the exceptions.  Shan Levans Beeper  [phone]  Cell  [phone]  If no response or cell goes to voicemail, call beeper [phone]

## 2011-09-02 ENCOUNTER — Inpatient Hospital Stay (HOSPITAL_COMMUNITY)
Admission: RE | Admit: 2011-09-02 | Discharge: 2011-09-08 | DRG: 945 | Disposition: A | Discharge status: Home / Self Care | Payer: 59 | Source: Ambulatory Visit | Attending: Physical Medicine & Rehabilitation | Admitting: Physical Medicine & Rehabilitation

## 2011-09-02 DIAGNOSIS — J45902 Unspecified asthma with status asthmaticus: Secondary | ICD-10-CM

## 2011-09-02 DIAGNOSIS — G7281 Critical illness myopathy: Secondary | ICD-10-CM

## 2011-09-02 DIAGNOSIS — F411 Generalized anxiety disorder: Secondary | ICD-10-CM

## 2011-09-02 DIAGNOSIS — Z87891 Personal history of nicotine dependence: Secondary | ICD-10-CM

## 2011-09-02 DIAGNOSIS — J441 Chronic obstructive pulmonary disease with (acute) exacerbation: Secondary | ICD-10-CM

## 2011-09-02 DIAGNOSIS — J111 Influenza due to unidentified influenza virus with other respiratory manifestations: Secondary | ICD-10-CM

## 2011-09-02 DIAGNOSIS — J45901 Unspecified asthma with (acute) exacerbation: Secondary | ICD-10-CM

## 2011-09-02 DIAGNOSIS — J9600 Acute respiratory failure, unspecified whether with hypoxia or hypercapnia: Secondary | ICD-10-CM

## 2011-09-02 DIAGNOSIS — I10 Essential (primary) hypertension: Secondary | ICD-10-CM

## 2011-09-02 DIAGNOSIS — Z79899 Other long term (current) drug therapy: Secondary | ICD-10-CM

## 2011-09-02 DIAGNOSIS — E785 Hyperlipidemia, unspecified: Secondary | ICD-10-CM

## 2011-09-02 DIAGNOSIS — J449 Chronic obstructive pulmonary disease, unspecified: Secondary | ICD-10-CM

## 2011-09-02 DIAGNOSIS — R5381 Other malaise: Secondary | ICD-10-CM

## 2011-09-02 DIAGNOSIS — Z5189 Encounter for other specified aftercare: Principal | ICD-10-CM

## 2011-09-02 DIAGNOSIS — M545 Low back pain, unspecified: Secondary | ICD-10-CM

## 2011-09-02 DIAGNOSIS — T380X5A Adverse effect of glucocorticoids and synthetic analogues, initial encounter: Secondary | ICD-10-CM

## 2011-09-02 DIAGNOSIS — J14 Pneumonia due to Hemophilus influenzae: Secondary | ICD-10-CM

## 2011-09-02 DIAGNOSIS — J4489 Other specified chronic obstructive pulmonary disease: Secondary | ICD-10-CM

## 2011-09-02 DIAGNOSIS — I4891 Unspecified atrial fibrillation: Secondary | ICD-10-CM

## 2011-09-02 DIAGNOSIS — R7309 Other abnormal glucose: Secondary | ICD-10-CM

## 2011-09-02 DIAGNOSIS — J1100 Influenza due to unidentified influenza virus with unspecified type of pneumonia: Secondary | ICD-10-CM

## 2011-09-02 LAB — GLUCOSE, CAPILLARY
Glucose-Capillary: 146 mg/dL — ABNORMAL HIGH (ref 70–99)
Glucose-Capillary: 113 mg/dL — ABNORMAL HIGH (ref 70–99)

## 2011-09-02 LAB — BASIC METABOLIC PANEL
BUN: 12 mg/dL (ref 6–23)
Calcium: 8.2 mg/dL — ABNORMAL LOW (ref 8.4–10.5)
Creatinine, Ser: 0.41 mg/dL — ABNORMAL LOW (ref 0.50–1.10)
GFR calc Af Amer: >90 mL/min (ref >90)
GFR calc non Af Amer: >90 mL/min (ref >90)

## 2011-09-02 MED ORDER — LEVALBUTEROL HCL 0.63 MG/3ML IN NEBU
0.6300 mg | INHALATION_SOLUTION | Freq: Four times a day (QID) | RESPIRATORY_TRACT | Status: DC
  Administered 2011-09-02 – 2011-09-03 (×3): 0.63 mg via RESPIRATORY_TRACT
  Filled 2011-09-02 (×9): qty 3

## 2011-09-02 MED ORDER — SENNA 8.6 MG PO TABS
1.0000 | ORAL_TABLET | Freq: Two times a day (BID) | ORAL | Status: DC
  Administered 2011-09-02 – 2011-09-03 (×3): 8.6 mg via ORAL
  Filled 2011-09-02 (×6): qty 1

## 2011-09-02 MED ORDER — POLYETHYLENE GLYCOL 3350 17 G PO PACK
17.0000 g | PACK | Freq: Every day | ORAL | Status: DC | PRN
  Filled 2011-09-02: qty 1

## 2011-09-02 MED ORDER — MOXIFLOXACIN HCL 400 MG PO TABS
400.0000 mg | ORAL_TABLET | Freq: Every day | ORAL | Status: DC
  Administered 2011-09-02 – 2011-09-07 (×6): 400 mg via ORAL
  Filled 2011-09-02 (×8): qty 1

## 2011-09-02 MED ORDER — IPRATROPIUM BROMIDE 0.02 % IN SOLN
0.5000 mg | Freq: Four times a day (QID) | RESPIRATORY_TRACT | Status: DC
  Administered 2011-09-02 – 2011-09-04 (×6): 0.5 mg via RESPIRATORY_TRACT
  Filled 2011-09-02 (×7): qty 2.5

## 2011-09-02 MED ORDER — ENOXAPARIN SODIUM 40 MG/0.4ML ~~LOC~~ SOLN
40.0000 mg | SUBCUTANEOUS | Status: DC
  Administered 2011-09-02 – 2011-09-07 (×6): 40 mg via SUBCUTANEOUS
  Filled 2011-09-02 (×7): qty 0.4

## 2011-09-02 MED ORDER — HYDROCHLOROTHIAZIDE 12.5 MG PO CAPS
12.5000 mg | ORAL_CAPSULE | Freq: Every day | ORAL | Status: DC
  Administered 2011-09-03 – 2011-09-08 (×6): 12.5 mg via ORAL
  Filled 2011-09-02 (×7): qty 1

## 2011-09-02 MED ORDER — SORBITOL 70 % SOLN
30.0000 mL | Freq: Every day | Status: DC | PRN
  Filled 2011-09-02: qty 30

## 2011-09-02 MED ORDER — ALPRAZOLAM 0.5 MG PO TABS
0.5000 mg | ORAL_TABLET | Freq: Three times a day (TID) | ORAL | Status: DC | PRN
  Administered 2011-09-05 (×2): 0.5 mg via ORAL
  Filled 2011-09-02 (×2): qty 1

## 2011-09-02 MED ORDER — PREDNISONE 20 MG PO TABS
40.0000 mg | ORAL_TABLET | Freq: Every day | ORAL | Status: DC
  Administered 2011-09-03 – 2011-09-04 (×2): 40 mg via ORAL
  Filled 2011-09-02 (×4): qty 2

## 2011-09-02 MED ORDER — LEVALBUTEROL HCL 0.63 MG/3ML IN NEBU
0.6300 mg | INHALATION_SOLUTION | RESPIRATORY_TRACT | Status: DC | PRN
  Filled 2011-09-02: qty 3

## 2011-09-02 MED ORDER — DILTIAZEM HCL 90 MG PO TABS
90.0000 mg | ORAL_TABLET | Freq: Four times a day (QID) | ORAL | Status: DC
  Administered 2011-09-02 – 2011-09-08 (×23): 90 mg via ORAL
  Filled 2011-09-02 (×28): qty 1

## 2011-09-02 MED ORDER — GUAIFENESIN-DM 100-10 MG/5ML PO SYRP: 5.0000 mL | ORAL_SOLUTION | Freq: Four times a day (QID) | ORAL | Status: DC | PRN

## 2011-09-02 MED ORDER — BISACODYL 10 MG RE SUPP: 10.0000 mg | Freq: Every day | RECTAL | Status: DC | PRN

## 2011-09-02 MED ORDER — FAMOTIDINE 20 MG PO TABS
20.0000 mg | ORAL_TABLET | Freq: Two times a day (BID) | ORAL | Status: DC
  Administered 2011-09-02 – 2011-09-08 (×12): 20 mg via ORAL
  Filled 2011-09-02 (×15): qty 1

## 2011-09-02 MED ORDER — INSULIN GLARGINE 100 UNIT/ML ~~LOC~~ SOLN
15.0000 [IU] | Freq: Every day | SUBCUTANEOUS | Status: DC
  Administered 2011-09-03: 15 [IU] via SUBCUTANEOUS
  Filled 2011-09-02: qty 3

## 2011-09-02 MED ORDER — INSULIN ASPART 100 UNIT/ML ~~LOC~~ SOLN
0.0000 [IU] | Freq: Three times a day (TID) | SUBCUTANEOUS | Status: DC
  Administered 2011-09-02: 3 [IU] via SUBCUTANEOUS
  Administered 2011-09-03: 7 [IU] via SUBCUTANEOUS
  Administered 2011-09-03: 3 [IU] via SUBCUTANEOUS
  Administered 2011-09-04: 7 [IU] via SUBCUTANEOUS
  Administered 2011-09-05: 3 [IU] via SUBCUTANEOUS
  Administered 2011-09-05 – 2011-09-06 (×2): 4 [IU] via SUBCUTANEOUS
  Administered 2011-09-06 – 2011-09-07 (×2): 3 [IU] via SUBCUTANEOUS
  Filled 2011-09-02 (×2): qty 3

## 2011-09-02 MED ORDER — PROMETHAZINE HCL 12.5 MG PO TABS: 12.5000 mg | ORAL_TABLET | Freq: Four times a day (QID) | ORAL | Status: DC | PRN

## 2011-09-02 MED ORDER — ACETAMINOPHEN 325 MG PO TABS
325.0000 mg | ORAL_TABLET | ORAL | Status: DC | PRN
  Administered 2011-09-03: 650 mg via ORAL
  Filled 2011-09-02: qty 2

## 2011-09-02 MED ORDER — PROMETHAZINE HCL 12.5 MG RE SUPP: 12.5000 mg | Freq: Four times a day (QID) | RECTAL | Status: DC | PRN

## 2011-09-02 MED ORDER — PROMETHAZINE HCL 25 MG/ML IJ SOLN: 12.5000 mg | Freq: Four times a day (QID) | INTRAMUSCULAR | Status: DC | PRN

## 2011-09-02 NOTE — Plan of Care (Signed)
Overall Plan of Care [facility]) Patient Details Name: [name] MRN: [id number] DOB: [birth year]  Diagnosis:  Deconditioning, critical illness myopathy  Primary Diagnosis:    Physical deconditioning Co-morbidities: respiratory failure, copd  Functional Problem List  Patient demonstrates impairments in the following areas: Balance, Bowel, Edema, Endurance, Medication Management, Nutrition and Pain  Basic ADL's: grooming, bathing, dressing and toileting Advanced ADL's: simple meal preparation  Transfers:  bed mobility, bed to chair, car and furniture Locomotion:  ambulation, wheelchair mobility and stairs  Additional Impairments:  Other  Anticipated Outcomes Item Anticipated Outcome  Eating/Swallowing    Basic self-care  Mod independent   Tolieting  Mod independent  Bowel/Bladder  Mod independent  Transfers  Modified independent  Locomotion  Modified independent gait, minA (due to no rails)/superivison stairs  Communication    Cognition    Pain  < 3  Safety/Judgment  Mod independent  Other     Therapy Plan:  1-2x day 60-90 minutes       Team Interventions: Item RN PT OT SLP SW TR Other  Self Care/Advanced ADL Retraining   x      Neuromuscular Re-Education  x       Therapeutic Activities  x x      UE/LE Strength Training/ROM  x x      UE/LE Coordination Activities  x x      Visual/Perceptual Remediation/Compensation         DME/Adaptive Equipment Instruction  x x      Therapeutic Exercise  x x      Balance/Vestibular Training  x x      Patient/Family Education  x x      Cognitive Remediation/Compensation         Functional Mobility Training  x x      Ambulation/Gait Training  x x      Stair Training  x       Wheelchair Propulsion/Positioning  x x      Functional Tourist information centre manager Reintegration  x x      Dysphagia/[employer]iration Film/video editor         Bladder Management x        Bowel  Management x        Disease Management/Prevention x        Pain Management x x x      Medication Management x        Skin Care/Wound Management x x x      Splinting/Orthotics  x       Discharge Planning  x x  x    Psychosocial Support     x                       Team Discharge Planning: Destination:  Home Projected Follow-up:  PT and Outpatient; OT-TBD Projected Equipment Needs:  AD TBD, 3-in-1, and tub seat vs tub bench Patient/family involved in discharge planning:  Yes  MD ELOS: 1 WEEK Medical Rehab Prognosis:  Excellent Assessment: Pt is admitted for CIR therapies.  Pt will receive at least 3 hours of therapy at least 5 days per week addressing gait, balance, self-care, stamina, breathing techniques, safety with modified independent goals set.  Pt is quite motivated to push herself.

## 2011-09-02 NOTE — H&P (Signed)
Physical Medicine and Rehabilitation Admission H&P  [name] is an 66 y.o. female.  Chief Complaint   Patient presents with   .  Tachycardia   .  Shortness of Breath   :  HPI: 66 year old right-handed white female with past medical history of moderate persistent asthma as well as history of tobacco abuse. 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 Admitted December 29 with worsening shortness of breath, cough, sputum production for 2 days as well as fever of 102. She received albuterol treatment and found to have atrial fibrillation with rapid ventricular rate with heart rate the 200s. She was given adenosine x2 and placed on Cardizem drip per cardiology services Dr. Dietrich Pates. Chest x-ray showed worsening perihilar and infrahilar bronchitic changes versus early pneumonitis. She did require intubation and later extubated on January 3. Placed on broad-spectrum antibiotics including Tamiflu. Followup pulmonary services for chronic obstructive pulmonary disease exacerbation with acute respiratory failure due to flu and a Hemophilus pneumonia. Subcutaneous Lovenox added for deep vein thrombosis prophylaxis. Venous Dopplers lower extremity on December 30 were negative. She remains on prednisone 60 mg daily with plan for taper to 40 mg on January 7. Cardiac rate controlled on Cardizem 90 mg every 6 hours. Echocardiogram later completed that showed ejection fraction of 65% without emboli and normal systolic function. Currently on Rocephin 1 g at bedtime for pneumonia and changed to by mouth Avelox on 09/01/2011. Blood sugars with some elevation felt to be induced secondary to steroids and placed on Lantus insulin that has been adjusted as steroids tapered. Physical therapy latest session completed on January 7 she required +2 total assist for sit to supine. Currently requires +2 total assist to ambulate 10 feet with a rolling walker and limited activity tolerance. Physical  medicine and rehabilitation was consulted per physical therapy request to consider inpatient rehabilitation services.  Review of Systems  Constitutional: Positive for malaise/fatigue.  Eyes: Negative for double vision.  Respiratory: Positive for cough and shortness of breath.  Cardiovascular: Negative for chest pain.  Gastrointestinal: Positive for constipation. Negative for nausea.  Genitourinary: Negative for dysuria.  Musculoskeletal: Positive for myalgias.  Skin: Negative.  Neurological: Negative for dizziness and headaches.  Psychiatric/Behavioral: Negative  Past Medical History   Diagnosis  Date   .  Moderate asthma    .  Hypertension    .  Hypercholesteremia    .  Lumbar back pain    .  Anxiety     Past Surgical History   Procedure  Date   .  Tonsillectomy and adnoidectomy    .  Cholecystectomy    .  Left thumb cmc arthroplasty  11/2010     by DrGramig    History reviewed. No pertinent family history.  Social History: reports that she quit smoking about 9 years ago. Her smoking use included Cigarettes. She does not have any smokeless tobacco history on file. She reports that she drinks alcohol. Her drug history not on file.  Allergies:  Allergies   Allergen  Reactions   .  Codeine      REACTION: nausea    Medications Prior to Admission   Medication  Dose  Route  Frequency  Provider  Last Rate  Last Dose   .  0.9 % sodium chloride infusion  250 mL  Intravenous  PRN  Vilinda Blanks Minor, NP  10 mL/hr at 08/29/11 1038  250 mL at 08/29/11 1038   .  acetaminophen (TYLENOL) tablet  650 mg  650 mg  Oral  Q6H PRN  Shan Levans, MD   650 mg at 08/29/11 1615   .  adenosine (ADENOCARD) 6 MG/2ML injection         .  ALPRAZolam Prudy Feeler) tablet 0.5 mg  0.5 mg  Oral  TID PRN  Shan Levans, MD   0.5 mg at 09/01/11 0100   .  antiseptic oral rinse (BIOTENE) solution 15 mL  15 mL  Mouth Rinse  [id number]  Juan Rojas   15 mL at 08/31/11 1600   .  aspirin chewable tablet 324 mg  324 mg  Oral   NOW  Juan Rojas   324 mg at 08/23/11 0050    Or   .  aspirin suppository 300 mg  300 mg  Rectal  NOW  Orbie Hurst     .  chlorhexidine (PERIDEX) 0.12 % solution 15 mL  15 mL  Mouth Rinse  BID  Juan Rojas   15 mL at 09/02/11 0806   .  diltiazem (CARDIZEM) 100 mg in dextrose 5 % 100 mL infusion  5 mg/hr  Intravenous  Once  [employer]. Rubin Payor, MD   5 mg/hr at 08/22/11 2014   .  diltiazem (CARDIZEM) injection SOLN 10 mg  10 mg  Intravenous  Once  [employer]. Pickering, MD   10 mg at 08/22/11 1829   .  diltiazem (CARDIZEM) injection SOLN 10 mg  10 mg  Intravenous  Once  [employer]. Pickering, MD   10 mg at 08/22/11 1915   .  diltiazem (CARDIZEM) injection SOLN 20 mg  20 mg  Intravenous  Once  [employer]. Pickering, MD   20 mg at 08/22/11 2010   .  diltiazem (CARDIZEM) tablet 90 mg  90 mg  Oral  Q6H  Peter Swazil[place], MD   90 mg at 09/02/11 0615   .  diphenhydrAMINE (BENADRYL) capsule 25 mg  25 mg  Oral  QHS PRN  Shan Levans, MD   25 mg at 08/29/11 2111   .  enoxaparin (LOVENOX) injection 40 mg  40 mg  Subcutaneous  Q24H  Peter Swaz[place]and, MD   40 mg at 09/01/11 1809   .  etomidate (AMIDATE) 2 MG/ML injection         .  etomidate (AMIDATE) injection 20 mg  20 mg  Intravenous  Once  Kalman Shan, MD   20 mg at 08/24/11 1604   .  fentaNYL (SUBLIMAZE) injection 100 mcg  100 mcg  Intravenous  Once  Kalman Shan, MD   100 mcg at 08/24/11 1545   .  furosemide (LASIX) injection 20 mg  20 mg  Intravenous  Once  Kalman Shan, MD   20 mg at 08/23/11 1358   .  furosemide (LASIX) injection 40 mg  40 mg  Intravenous  Once  Shan Levans, MD   40 mg at 08/25/11 2057   .  insulin aspart (novoLOG) injection 0-20 Units  0-20 Units  Subcutaneous  TID WC  Shan Levans, MD   4 Units at 09/02/11 [phone]   .  insulin aspart (novoLOG) injection 3 Units  3 Units  Subcutaneous  TID WC  Shan Levans, MD   3 Units at 09/02/11 [phone]   .  insulin glargine (LANTUS) injection 15 Units  15 Units  Subcutaneous  Daily  Rakesh V. Vassie Loll,  MD   15 Units at 09/01/11 1008   .  ipratropium (ATROVENT) nebulizer solution 0.5 mg  0.5 mg  Nebulization  Q6H  Shan Levans, MD   0.5 mg at 09/02/11 0221   .  levalbuterol (XOPENEX) nebulizer solution 0.63 mg  0.63 mg  Nebulization  Once  [employer]. Pickering, MD   0.63 mg at 08/22/11 2041   .  levalbuterol (XOPENEX) nebulizer solution 0.63 mg  0.63 mg  Nebulization  Q3H PRN  Vilinda Blanks Minor, NP     .  levalbuterol (XOPENEX) nebulizer solution 0.63 mg  0.63 mg  Nebulization  Q6H  Shan Levans, MD   0.63 mg at 09/02/11 0221   .  lidocaine (cardiac) 100 mg/80ml (XYLOCAINE) 20 MG/ML injection 2%         .  magnesium sulfate IVPB 2 g 50 mL  2 g  Intravenous  STAT  Kalman Shan, MD   2 g at 08/23/11 0911   .  midazolam (VERSED) 5 MG/ML injection       4 mg at 08/24/11 1603   .  morphine 2 MG/ML injection 2 mg  2 mg  Intravenous  Once  Kalman Shan, MD   2 mg at 08/24/11 1402   .  moxifloxacin (AVELOX) tablet 400 mg  400 mg  Oral  q1800  William S Minor, NP     .  oseltamivir (TAMIFLU) capsule 75 mg  75 mg  Oral  BID  Kalman Shan, MD   75 mg at 08/27/11 2200   .  potassium chloride SA (K-DUR,KLOR-CON) CR tablet 20 mEq  20 mEq  Oral  BID  Waymon Budge, MD   20 mEq at 09/01/11 2221   .  potassium chloride SA (K-DUR,KLOR-CON) CR tablet 40 mEq  40 mEq  Oral  Once  Kalman Shan, MD   40 mEq at 08/23/11 1410   .  potassium chloride SA (K-DUR,KLOR-CON) CR tablet 40 mEq  40 mEq  Oral  Once  Shan Levans, MD   40 mEq at 09/01/11 1809   .  predniSONE (DELTASONE) tablet 40 mg  40 mg  Oral  Once  [employer]. Pickering, MD   40 mg at 08/22/11 2019   .  predniSONE (DELTASONE) tablet 40 mg  40 mg  Oral  Q breakfast  Vilinda Blanks Minor, NP   40 mg at 09/02/11 0806   .  psyllium (HYDROCIL/METAMUCIL) packet 1 packet  1 packet  Oral  Daily  Waymon Budge, MD   1 packet at 09/01/11 1008   .  rocuronium (ZEMURON) 50 MG/5ML injection         .  rocuronium (ZEMURON) injection 70 mg  70 mg  Intravenous   Once  Kalman Shan, MD   70 mg at 08/24/11 1604   .  sodium chloride 0.9 % bolus 500 mL  500 mL  Intravenous  Once  Harrold Donath R. Pickering, MD   500 mL at 08/22/11 2320   .  succinylcholine (ANECTINE) 20 MG/ML injection         .  zafirlukast (ACCOLATE) tablet 160 mg  160 mg  Oral  Once  Kalman Shan, MD   160 mg at 08/23/11 0909   .  DISCONTD: 0.9 % sodium chloride infusion   Intravenous  Continuous  Juliet Rude. Pickering, MD  125 mL/hr at 08/24/11 0600    .  DISCONTD: 0.9 % sodium chloride infusion  250 mL  Intravenous  PRN  Orbie Hurst  20 mL/hr at 08/24/11 2000    .  DISCONTD: 0.9 % sodium chloride infusion  250 mL  Intravenous  PRN  Comer Locket. Vassie Loll, MD  75 mL/hr at 08/27/11 0630  250 mL at 08/27/11 0630   .  DISCONTD: 0.9 % sodium chloride infusion  250 mL  Intravenous  PRN  Rakesh V. Vassie Loll, MD  50 mL/hr at 08/28/11 1446  1,000 mL at 08/28/11 1446   .  DISCONTD: albuterol (PROVENTIL) (5 MG/ML) 0.5% nebulizer solution 2.5 mg  2.5 mg  Nebulization  Q2H PRN  Kalman Shan, MD     .  DISCONTD: azithromycin (ZITHROMAX) tablet 500 mg  500 mg  Oral  Daily  Juan Rojas   500 mg at 08/29/11 1033   .  DISCONTD: cefTRIAXone (ROCEPHIN) 1 g in dextrose 5 % 50 mL IVPB  1 g  Intravenous  QHS  Juan Rojas   1 g at 08/31/11 2157   .  DISCONTD: cloNIDine (CATAPRES - Dosed in mg/24 hr) patch 0.1 mg  0.1 mg  Transdermal  Weekly  Gerrit Friends. Rothbart, MD   0.1 mg at 08/23/11 1010   .  DISCONTD: diltiazem (CARDIZEM) 100 mg in dextrose 5 % 100 mL infusion  10 mg/hr  Intravenous  Once  [employer]. Pickering, MD  10 mL/hr at 08/23/11 0400  10 mg/hr at 08/23/11 0400   .  DISCONTD: diltiazem (CARDIZEM) 100 mg in dextrose 5 % 100 mL infusion  5 mg/hr  Intravenous  Once  Kalman Shan, MD     .  DISCONTD: diltiazem (CARDIZEM) 100 mg in dextrose 5 % 100 mL infusion  5-15 mg/hr  Intravenous  Continuous  Gerrit Friends. Rothbart, MD  10 mL/hr at 08/24/11 0700  10 mg/hr at 08/24/11 0700   .  DISCONTD: diltiazem (CARDIZEM) 100 mg in  dextrose 5 % 100 mL infusion  15 mg/hr  Intravenous  Continuous  Gerrit Friends. Rothbart, MD  15 mL/hr at 08/26/11 0231  15 mg/hr at 08/26/11 0231   .  DISCONTD: enoxaparin (LOVENOX) injection 70 mg  70 mg  Subcutaneous  Q12H  Juan Rojas   70 mg at 08/27/11 0010   .  DISCONTD: enoxaparin (LOVENOX) injection 75 mg  1 mg/kg  Subcutaneous  Once  Harrold Donath R. Pickering, MD   75 mg at 08/22/11 2330   .  DISCONTD: feeding supplement (JEVITY 1.2) liquid 1,000 mL  1,000 mL  Per Tube  Continuous  Rakesh V. Vassie Loll, MD     .  DISCONTD: feeding supplement (OXEPA) liquid 1,000 mL  1,000 mL  Per Tube  Continuous  Rakesh V. Vassie Loll, MD  40 mL/hr at 08/27/11 1717  1,000 mL at 08/27/11 1717   .  DISCONTD: feeding supplement (PRO-STAT SUGAR FREE 64) liquid 30 mL  30 mL  Oral  TID WC  Hoyt Koch, RD   30 mL at 08/27/11 1704   .  DISCONTD: fentaNYL (SUBLIMAZE) 10 mcg/mL in sodium chloride 0.9 % 250 mL infusion  50-400 mcg/hr  Intravenous  Titrated  Kalman Shan, MD   50 mcg/hr at 08/28/11 0400   .  DISCONTD: fentaNYL (SUBLIMAZE) bolus via infusion 50-100 mcg  50-100 mcg  Intravenous  Q6H PRN  Kalman Shan, MD   75 mcg at 08/27/11 1541   .  DISCONTD: heparin injection 5,000 Units  5,000 Units  Subcutaneous  Q8H  Orbie Hurst     .  DISCONTD: hydrALAZINE (APRESOLINE) injection 10-20 mg  10-20 mg  Intravenous  Q4H PRN  Rakesh V. Vassie Loll, MD   10 mg at 08/30/11 2148   .  DISCONTD: insulin aspart (novoLOG) injection 0-20 Units  0-20 Units  Subcutaneous  Q4H  Rakesh V. Vassie Loll, MD   4 Units at 08/30/11 [phone]   .  DISCONTD: insulin aspart (novoLOG) injection 3 Units  3 Units  Subcutaneous  Q4H  Rakesh V. Vassie Loll, MD   3 Units at 08/30/11 0800   .  DISCONTD: insulin glargine (LANTUS) injection 10 Units  10 Units  Subcutaneous  Daily  Rakesh V. Vassie Loll, MD   10 Units at 08/27/11 1004   .  DISCONTD: ipratropium (ATROVENT) nebulizer solution 0.5 mg  0.5 mg  Nebulization  Q6H  Juan Rojas   0.5 mg at 08/22/11 2359   .  DISCONTD: ipratropium  (ATROVENT) nebulizer solution 0.5 mg  0.5 mg  Nebulization  Q3H  Kalman Shan, MD   0.5 mg at 08/30/11 0734   .  DISCONTD: levalbuterol (XOPENEX) nebulizer solution 0.63 mg  0.63 mg  Nebulization  Q6H  Orbie Hurst     .  DISCONTD: levalbuterol (XOPENEX) nebulizer solution 0.63 mg  0.63 mg  Nebulization  Q6H PRN  Juan Rojas   0.63 mg at 08/23/11 0409   .  DISCONTD: levalbuterol (XOPENEX) nebulizer solution 0.63 mg  0.63 mg  Nebulization  Q6H  Juan Rojas   0.63 mg at 08/22/11 2359   .  DISCONTD: levalbuterol (XOPENEX) nebulizer solution 0.63 mg  0.63 mg  Nebulization  Q3H  Kalman Shan, MD   0.63 mg at 08/30/11 0735   .  DISCONTD: methylPREDNISolone sodium succinate (SOLU-MEDROL) 125 MG injection 60 mg  60 mg  Intravenous  Q8H  Juan Rojas   60 mg at 08/28/11 0515   .  DISCONTD: methylPREDNISolone sodium succinate (SOLU-MEDROL) 40 MG injection 40 mg  40 mg  Intravenous  Q8H  Rakesh V. Vassie Loll, MD   40 mg at 08/29/11 1314   .  DISCONTD: methylPREDNISolone sodium succinate (SOLU-MEDROL) 40 MG injection 40 mg  40 mg  Intravenous  Q12H  Rakesh V. Vassie Loll, MD   40 mg at 08/30/11 0120   .  DISCONTD: midazolam (VERSED) 1 mg/mL in sodium chloride 0.9 % 50 mL infusion  2-10 mg/hr  Intravenous  Titrated  Kalman Shan, MD   1 mg/hr at 08/27/11 1900   .  DISCONTD: midazolam (VERSED) bolus via infusion 1-2 mg  1-2 mg  Intravenous  Q2H PRN  Kalman Shan, MD     .  DISCONTD: midazolam (VERSED) injection 4 mg  4 mg  Intravenous  Once  Kalman Shan, MD     .  DISCONTD: pantoprazole (PROTONIX) EC tablet 40 mg  40 mg  Oral  Q1200  Kalman Shan, MD   40 mg at 08/24/11 1200   .  DISCONTD: pantoprazole sodium (PROTONIX) 40 mg/20 mL oral suspension 40 mg  40 mg  Per Tube  Q1200  Kalman Shan, MD   40 mg at 08/27/11 1131   .  DISCONTD: predniSONE (DELTASONE) 60 mg  60 mg  Oral  Q breakfast  Shan Levans, MD   60 mg at 09/01/11 0800    Medications Prior to Admission   Medication  Sig  Dispense  Refill     .  albuterol (PROVENTIL HFA;VENTOLIN HFA) 108 (90 BASE) MCG/ACT inhaler  Inhale 2 puffs into the lungs every 6 (six) hours as needed for wheezing.  3 Inhaler  4   .  Fluticasone-Salmeterol (ADVAIR DISKUS) 250-50 MCG/DOSE AEPB  Inhale 1 puff into the lungs 2 (two) times daily.  3 each  3   .  losartan-hydrochlorothiazide (HYZAAR) 100-12.5 MG per tablet  Take 1 tablet by mouth daily.  90 tablet  3    Home:  Home Living  Lives With: Alone  Receives Help From: (daugther in town from [place])  Type of Home: Apartment  Home Layout: One level  Home Access: Stairs to enter  Entrance Stairs-Rails: Right  Secretary/administrator of Steps: 20  Bathroom Shower/Tub: IT sales professional: None  Functional History:  Prior Function  Level of Independence: Independent with basic ADLs  Driving: Yes  Vocation: Full time employment  Functional Status:  Mobility:  Bed Mobility  Bed Mobility: Yes  Supine to Sit: 4: Min assist;HOB elevated (Comment degrees) (HOB elevated 45' and increased time)  Supine to Sit Details (indicate cue type and reason): HOB increased 45' and increased time  Sit to Supine - Right (DO NOT USE): 1: +2 Total assist;HOB elevated (comment degrees);With rail  Sit to Supine - Right Details DO NOT USE: Vcs safety, technique, hand placement. Increased time. Assist to control trunk and get LEs back onto bed.  Transfers  Transfers: Yes  Sit to Stand: 1: +2 Total assist;From bed  Sit to Stand Details (indicate cue type and reason): total assist + 2 pt 65% with 75% VC's on hand placement and increased time  Stand to Sit: 2: Max assist;To chair/3-in-1  Stand to Sit Details: 75% VC's on hand placement as pt demon uncontrolled decend 2nd fatigue  Ambulation/Gait  Ambulation/Gait: Yes  Ambulation/Gait Assistance: 1: +2 Total assist  Ambulation/Gait Assistance Details (indicate cue type and reason): Total assist + 2 pt 65% very limited act  tolerance, shaky unsteady gait with 4/4 DOE. Amb twice 6' then 4' on 3 lts On stats avg 92% chair following behind for safety and very limited act toleance.  Ambulation Distance (Feet): 10 Feet  Assistive device: Rolling walker  Gait Pattern: Shuffle  Gait velocity: very shaky unsteady gait with mild anxiety with activity  Stairs: No  Wheelchair Mobility  Wheelchair Mobility: No  ADL:  ADL  Grooming: Simulated  Where Assessed - Grooming: Standing at sink;Other (comment) (standing in front of recliner)  Upper Body Bathing: Simulated;Moderate assistance  Where Assessed - Upper Body Bathing: Unsupported;Sitting, chair  Lower Body Bathing: Simulated;Maximal assistance  Where Assessed - Lower Body Bathing: Sit to stand from chair  Upper Body Dressing: Simulated;Moderate assistance  Where Assessed - Lower Body Dressing: Unsupported;Sitting, chair  Toilet Transfer: Simulated  Toilet Transfer Method: Other (comment) (sit to stand from chair)  Toileting - Clothing Manipulation: Simulated;Moderate assistance  Where Assessed - Toileting Clothing Manipulation: Standing  Toileting - Hygiene: Simulated;Moderate assistance  Where Assessed - Toileting Hygiene: Standing  ADL Comments: fatigue and anxiety very limiting. Pt will need significant rehab to increase I and return to PLOF  Cognition:  Cognition  Arousal/Alertness: Lethargic  Orientation Level: Oriented X4  Cognition  Arousal/Alertness: Lethargic  Overall Cognitive Status: Appears within functional limits for tasks assessed  Orientation Level: Oriented X4  Blood pressure 170/93, pulse 74, temperature 97.5 F (36.4 C), temperature source Oral, resp. rate 18, height 5\' 2"  (1.575 m), weight 73 kg (160 lb 15 oz), SpO2 98.00%.  Physical Exam  Constitutional: She is oriented to person, place, and time. She appears well-developed.  HENT:  Head: Normocephalic.  Eyes: Pupils are equal, round, and reactive to light.  Neck: Normal range of  motion. Neck supple. No thyromegaly present.  Cardiovascular: Normal rate and regular rhythm.  Cardiac rate controlled  Pulmonary/Chest: Effort normal. She has no wheezes.  Abdominal: She exhibits no distension. There is no tenderness.  Musculoskeletal: Normal range of motion. She exhibits edema.  Neurological: She is alert and oriented to person, place, and time. No cranial nerve deficit.  Upper extremity strength 3+ to 4/5 prox to 4/5 distal. Tremor occasionally when she fatigued during a movement. LE 2+/5 prox to 3+/5 distally. No sensor deficits. DTR's 2+. Cognitively intact.  Skin: Skin is warm and dry.  Psychiatric: Her behavior is normal  Results for orders placed during the hospital encounter of 08/22/11 (from the past 48 hour(s))   GLUCOSE, CAPILLARY Status: Abnormal    Collection Time    08/31/11 11:37 AM   Component  Value  Range  Comment    Glucose-Capillary  140 (*)  70 - 99 (mg/dL)     Comment 1  Documented in Chart      Comment 2  Notify RN     GLUCOSE, CAPILLARY Status: Abnormal    Collection Time    08/31/11 4:52 PM   Component  Value  Range  Comment    Glucose-Capillary  249 (*)  70 - 99 (mg/dL)     Comment 1  Documented in Chart      Comment 2  Notify RN     GLUCOSE, CAPILLARY Status: Abnormal    Collection Time    08/31/11 8:48 PM   Component  Value  Range  Comment    Glucose-Capillary  102 (*)  70 - 99 (mg/dL)    BASIC METABOLIC PANEL Status: Abnormal    Collection Time    09/01/11 5:20 AM   Component  Value  Range  Comment    Sodium  135  135 - 145 (mEq/L)     Potassium  3.0 (*)  3.5 - 5.1 (mEq/L)     Chloride  98  96 - 112 (mEq/L)     CO2  32  19 - 32 (mEq/L)     Glucose, Bld  78  70 - 99 (mg/dL)     BUN  15  6 - 23 (mg/dL)     Creatinine, Ser  0.45 (*)  0.50 - 1.10 (mg/dL)     Calcium  7.9 (*)  8.4 - 10.5 (mg/dL)     GFR calc non Af Amer  >90  >90 (mL/min)     GFR calc Af Amer  >90  >90 (mL/min)    GLUCOSE, CAPILLARY Status: Abnormal    Collection Time     09/01/11 8:52 AM   Component  Value  Range  Comment    Glucose-Capillary  168 (*)  70 - 99 (mg/dL)    GLUCOSE, CAPILLARY Status: Abnormal    Collection Time    09/01/11 12:17 PM   Component  Value  Range  Comment    Glucose-Capillary  131 (*)  70 - 99 (mg/dL)    GLUCOSE, CAPILLARY Status: Abnormal    Collection Time    09/01/11 5:10 PM   Component  Value  Range  Comment    Glucose-Capillary  329 (*)  70 - 99 (mg/dL)    GLUCOSE, CAPILLARY Status: Abnormal    Collection Time    09/01/11 8:59 PM   Component  Value  Range  Comment    Glucose-Capillary  127 (*)  70 - 99 (mg/dL)    BASIC METABOLIC PANEL Status: Abnormal    Collection Time    09/02/11 5:28 AM   Component  Value  Range  Comment    Sodium  136  135 - 145 (mEq/L)     Potassium  3.7  3.5 - 5.1 (mEq/L)     Chloride  99  96 - 112 (mEq/L)     CO2  31  19 - 32 (mEq/L)     Glucose, Bld  74  70 - 99 (mg/dL)     BUN  12  6 - 23 (mg/dL)     Creatinine, Ser  1.61 (*)  0.50 - 1.10 (mg/dL)     Calcium  8.2 (*)  8.4 - 10.5 (mg/dL)     GFR calc non Af Amer  >90  >90 (mL/min)     GFR calc Af Amer  >90  >90 (mL/min)    GLUCOSE, CAPILLARY Status: Abnormal    Collection Time    09/02/11 7:54 AM   Component  Value  Range  Comment    Glucose-Capillary  153 (*)  70 - 99 (mg/dL)     No results found.  Post Admission Physician Evaluation:  1. Functional deficits secondary to severe deconditioning, critical illness myopathy 2. Patient is admitted to receive collaborative, interdisciplinary care between the physiatrist, rehab nursing staff, and therapy team. 3. Patient's level of medical complexity and substantial therapy needs in context of that medical necessity cannot be provided at a lesser intensity of care such as a SNF. 4. Patient has experienced substantial functional loss from his/her baseline which was documented above under the "Functional History" and "Functional Status" headings. Judging by the patient's diagnosis, physical exam, and  functional history, the patient has potential for functional progress which will result in measurable gains while on inpatient rehab. These gains will be of substantial and practical use upon discharge in facilitating mobility and self-care at the household level. 5. Physiatrist will provide 24 hour management of medical needs as well as oversight of the therapy plan/treatment and provide guidance as appropriate regarding the interaction of the two. 6. 24 hour rehab nursing will assist with bladder management, bowel management, safety, skin/wound care, disease management, medication administration and patient education and help integrate therapy concepts, techniques,education, etc. 7. PT will assess and treat for: Lower extremity strength, balance, neuromuscular reeducation, adaptive equipment, functional mobility. Goals are: Modified independent. 8. OT will assess and treat for: Upper extremity strength, ADLs, functional mobility, adaptive equipment training, safety. Goals are: Modified independent. 9. SLP will assess and treat for: Not applicable 10. Case Management and Social Worker will assess and treat for psychological issues and discharge planning. 11. Team conference will be held weekly to assess progress toward goals and to determine barriers to discharge. 12. Patient will receive at least 3 hours of therapy per day at least 5 days per week. 13. ELOS and Prognosis: One week excellent Medical Problem List and Plan:  1. Severe deconditioning/COPD exacerbation and Haemophilus pneumonia/critical illness myopathy. Continue nebulizer treatments as advised. Taper prednisone as per pulmonary services. Continue Avelox 400 mg daily as of January 8.  2. DVT Prophylaxis/Anticoagulation: Subcutaneous Lovenox . Monitor platelet counts and any signs of bleeding  3. Acute onset atrial fibrillation. Continue cardizem as advised per cardiology services. Monitor with increased activity. Heart rate-controlled on  evaluation today 4. Hyperglycemia. Patient no history of diabetes mellitus. Blood sugars induced secondary steroids. Maintained on Lantus insulin and will adjust his steroid taper. Check blood sugars a.c. and at bedtime  5 History of tobacco abuse. Provide counseling

## 2011-09-02 NOTE — Progress Notes (Signed)
Rehab admissions - I have approval from insurance and can admit to inpatient rehab today.  I have called and notified Brett Canales Minor of potential admission to rehab today.  Pager [phone]

## 2011-09-02 NOTE — Progress Notes (Signed)
Patient arrived to 4000 via [facility] at 1315. Daughter accompanied patient. Alert and oriented x 3 in good spirits. Denied pain. Oriented to unit, rehab schedule, call bell system, and safety plan. Patient verbalized understanding. Will continue with plan of care. Hedy Camara

## 2011-09-02 NOTE — Discharge Summary (Signed)
Physician Discharge Summary  Patient ID: [name] MRN: [id number] DOB/AGE: 12/13/1945 66 y.o.  Admit date: 08/22/2011 Discharge date: 09/02/2011  Problem List Active Problems:  * No active hospital problems. *    Hospital Course:  Pt is a 66 y/o F with PMHx of moderate persistent asthma, Hx of tobacco abuse, HTN, DL, anxiety with flu A followed by Hemophilus bacterial pneumona & fib with RVR.  1. Asthma/COPD excerbation/Acute Resp Failure due to FLU A & hemophilus pna  - Pt has Hx of moderate to persistent asthma, and Hx of tobacco abuse (15 p/yHx)  -wean fio2  -change abx to po  PLAN  - Extubated,1/3  - Continue nebulizers Adjust freq and ceftx X 7ds, prednisone 1/5>>  -. Appears to have a component of COPD - note compensated hypercarbia  2. A fib with RVR/ Htn  - [place] due to acute resp infection/asthma exacerbation  - TSH nml  - Started on cardizem, controlled rate BP stable  -echo - nml LV fn  PLAN  - Enoxaparin proph dose  - Changed to PO Cardizem per cards , off drip  3. Hyperglycemia - steroid induced  -cont 15U lantus while on steroids  -adjust novolog and cbg checks to tid and hs  -watch as steroids lowered  4. Hypokalemia   Lab  09/01/11 0520  08/31/11 0549  08/30/11 0254  08/29/11 0307  08/28/11 0310  08/26/11 0307   NA  135  135  137  144  146*  --   K  3.0*  2.8*  --  --  --  --   CL  98  95*  97  101  107  --   CO2  32  33*  33*  38*  35*  --   GLUCOSE  78  111*  139*  151*  190*  --   BUN  15  19  22   32*  40*  --   CREATININE  0.40*  0.37*  0.32*  0.36*  0.44*  --   CALCIUM  7.9*  8.3*  8.7  8.6  8.7  --   MG  --  --  --  --  --  3.0*   PHOS  --  --  --  --  --  3.3    -oral K replacement  -BMET 1/8  -d/c foley  4. DVT prophylaxis  -LWMH  6. Global - plan for HHPT vs inpt rehab, OT consult  Pt ok for Inpt rehab. She could tfr in am 09/02/11       Labs at discharge Lab Results  Component Value Date   CREATININE 0.41* 09/02/2011   BUN  12 09/02/2011   NA 136 09/02/2011   K 3.7 09/02/2011   CL 99 09/02/2011   CO2 31 09/02/2011   Lab Results  Component Value Date   WBC 11.1* 08/31/2011   HGB 12.4 08/31/2011   HCT 36.9 08/31/2011   MCV 88.9 08/31/2011   PLT 239 08/31/2011   Lab Results  Component Value Date   ALT 40* 12/11/2010   AST 33 12/11/2010   ALKPHOS 103 12/11/2010   BILITOT 0.7 12/11/2010   Lab Results  Component Value Date   INR 0.98 08/24/2011   INR 1.00 12/11/2010    Current radiology studies No results found.  Disposition: In house rehabilatation   Current Discharge Medication List    CONTINUE these medications which have NOT CHANGED   Details  albuterol (PROVENTIL HFA;VENTOLIN HFA) 108 (  90 BASE) MCG/ACT inhaler Inhale 2 puffs into the lungs every 6 (six) hours as needed for wheezing. Qty: 3 Inhaler, Refills: 4    Fluticasone-Salmeterol (ADVAIR DISKUS) 250-50 MCG/DOSE AEPB Inhale 1 puff into the lungs 2 (two) times daily. Qty: 3 each, Refills: 3    losartan-hydrochlorothiazide (HYZAAR) 100-12.5 MG per tablet Take 1 tablet by mouth daily. Qty: 90 tablet, Refills: 3         Discharged Condition: good  Signed: Steve Mirha Brucato ACNP Adolph Pollack PCCM Pager [phone] till 3 pm If no answer page [phone] 09/02/2011, 11:39 AM

## 2011-09-02 NOTE — Progress Notes (Signed)
Pt transferred to CIR at [facility].  Report called Elon Jester.

## 2011-09-02 NOTE — Progress Notes (Signed)
Rehab admissions - Awaiting insurance authorization for possible inpatient rehab admission today.  We do have bed available on rehab today.  Pager [phone]

## 2011-09-02 NOTE — Progress Notes (Signed)
Patient followed for progression of care as a benefit of [employer] insurance. Will facilitate discharge as needed to inpatient rehab at [facility] with [facility] case management. Will continue to follow the patient post d/c. GCPulliam, RN, CCM, # [phone].

## 2011-09-02 NOTE — PMR Pre-admission (Signed)
PMR Admission Coordinator Pre-Admission Assessment  Patient:  [name] is an 66 y.o., female MRN:  [id number] DOB:  25-Nov-1945 Height:  5\' 2"  (157.5 cm) Weight:  73 kg (160 lb 15 oz)  Insurance Information:  Precert X 7 days, update 09/08/11 HMO:     PPO:      PCP:      IPA:      80/20:      OTHER:  PRIMARY:[facility]      Policy#:[id number]      Subscriber:Beuna Jerger CM Name:Heather   Phone#:[phone] X [phone]    WGN#:[phone] Pre-Cert#:[id number]      Employer:[employer] at [employer] Benefits:  Phone #:[phone]     Name:Julie Eff. Date:08/25/09     Deduct:$400      Out of Pocket Max:$3000/$6000      Life UXL:[id number] CIR:90% w/ auth      SNF:80% w/ auth Outpatient: w/medical necessity     Co-Pay:$20/visit Home Health:80% w/auth      Co-Pay:20% DME:80% w/aauth > $500     Co-Pay:20% Providers:in network with [facility]/ [employer] network  Current Medical History:   Patient Admitting Diagnosis: Critical illness myopathy with severe deconditioning  History of Present Illness: Admitted 12/29 with SOB, cough, and fever.  Found in rapid Afib with rate in 200s.  CXR with early pneumonitis, then intubated.   Had acute resp failure due to flu and PNA.  Has a history of moderate persistent asthma and previous tobacco use.  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  Patients Past Medical History:   Past Medical History  Diagnosis Date  . Moderate asthma   . Hypertension   . Hypercholesteremia   . Lumbar back pain   . Anxiety    Family Medical History:  family history is not on file. Patients Current Diet: General  Prior Rehab/Hospitalizations: No rehab, but 1 night last May for Thumb surgery  Current Medications: Current facility-administered medications:0.9 %  sodium chloride infusion, 250 mL, Intravenous, PRN, Vilinda Blanks Minor, NP, Last Rate: 10 mL/hr at 08/29/11 1038, 250 mL at 08/29/11 1038;  acetaminophen (TYLENOL) tablet 650 mg, 650 mg, Oral, Q6H PRN, Shan Levans, MD,  650 mg at 08/29/11 1615;  ALPRAZolam Prudy Feeler) tablet 0.5 mg, 0.5 mg, Oral, TID PRN, Shan Levans, MD, 0.5 mg at 09/01/11 0100 antiseptic oral rinse (BIOTENE) solution 15 mL, 15 mL, Mouth Rinse, q12n4p, Juan Rojas, 15 mL at 08/31/11 1600;  chlorhexidine (PERIDEX) 0.12 % solution 15 mL, 15 mL, Mouth Rinse, BID, Juan Rojas, 15 mL at 09/02/11 0806;  diltiazem (CARDIZEM) tablet 90 mg, 90 mg, Oral, Q6H, Peter Swaziland, MD, 90 mg at 09/02/11 0615;  diphenhydrAMINE (BENADRYL) capsule 25 mg, 25 mg, Oral, QHS PRN, Shan Levans, MD, 25 mg at 08/29/11 2111 enoxaparin (LOVENOX) injection 40 mg, 40 mg, Subcutaneous, Q24H, Peter Swazil[place], MD, 40 mg at 09/01/11 1809;  insulin aspart (novoLOG) injection 0-20 Units, 0-20 Units, Subcutaneous, TID WC, Shan Levans, MD, 4 Units at 09/02/11 0807;  insulin aspart (novoLOG) injection 3 Units, 3 Units, Subcutaneous, TID WC, Shan Levans, MD, 3 Units at 09/02/11 0807 insulin glargine (LANTUS) injection 15 Units, 15 Units, Subcutaneous, Daily, Rakesh V. Vassie Loll, MD, 15 Units at 09/02/11 [phone];  ipratropium (ATROVENT) nebulizer solution 0.5 mg, 0.5 mg, Nebulization, Q6H, Shan Levans, MD, 0.5 mg at 09/02/11 0820;  levalbuterol (XOPENEX) nebulizer solution 0.63 mg, 0.63 mg, Nebulization, Q3H PRN, Vilinda Blanks Minor, NP levalbuterol (XOPENEX) nebulizer solution 0.63 mg, 0.63 mg, Nebulization, Q6H, Shan Levans, MD,  0.63 mg at 09/02/11 0820;  moxifloxacin (AVELOX) tablet 400 mg, 400 mg, Oral, q1800, Vilinda Blanks Minor, NP;  potassium chloride SA (K-DUR,KLOR-CON) CR tablet 20 mEq, 20 mEq, Oral, BID, Waymon Budge, MD, 20 mEq at 09/02/11 1610;  potassium chloride SA (K-DUR,KLOR-CON) CR tablet 40 mEq, 40 mEq, Oral, Once, Shan Levans, MD, 40 mEq at 09/01/11 1809 predniSONE (DELTASONE) tablet 40 mg, 40 mg, Oral, Q breakfast, Vilinda Blanks Minor, NP, 40 mg at 09/02/11 0806;  psyllium (HYDROCIL/METAMUCIL) packet 1 packet, 1 packet, Oral, Daily, Waymon Budge, MD, 1 packet at 09/02/11 [phone];   DISCONTD: cefTRIAXone (ROCEPHIN) 1 g in dextrose 5 % 50 mL IVPB, 1 g, Intravenous, QHS, Orbie Hurst, 1 g at 08/31/11 2157 DISCONTD: hydrALAZINE (APRESOLINE) injection 10-20 mg, 10-20 mg, Intravenous, Q4H PRN, Rakesh V. Vassie Loll, MD, 10 mg at 08/30/11 2148;  DISCONTD: predniSONE (DELTASONE) 60 mg, 60 mg, Oral, Q breakfast, Shan Levans, MD, 60 mg at 09/01/11 0800  Additional Precautions/Restrictions: Precautions Precautions: Fall Restrictions Weight Bearing Restrictions: No  Therapy Assessments Physical Therapy: Precautions Precautions: Fall Home Living Lives With: Alone Receives Help From:  (daugther in town from [place]) Type of Home: Apartment Home Layout: One level Home Access: Stairs to enter Entrance Stairs-Rails: Right Entrance Stairs-Number of Steps: 20 Bathroom Shower/Tub: Network engineer: None Prior Function Level of Independence: Independent with basic ADLs Driving: Yes Vocation: Full time employment    Occupational Therapy: Precautions Precautions: Fall Home Living Lives With: Alone Receives Help From:  (daugther in town from [place]) Type of Home: Apartment Home Layout: One level Home Access: Stairs to enter Entrance Stairs-Rails: Right Entrance Stairs-Number of Steps: 20 Bathroom Shower/Tub: Network engineer: None Prior Function Level of Independence: Independent with basic ADLs Driving: Yes Vocation: Full time employment   Restrictions Weight Bearing Restrictions: No ADL Grooming: Simulated Where Assessed - Grooming: Standing at sink;Other (comment) (standing in front of recliner) Upper Body Bathing: Simulated;Moderate assistance Where Assessed - Upper Body Bathing: Unsupported;Sitting, chair Lower Body Bathing: Simulated;Maximal assistance Where Assessed - Lower Body Bathing: Sit to stand from chair Upper Body Dressing: Simulated;Moderate  assistance Where Assessed - Lower Body Dressing: Unsupported;Sitting, chair Toilet Transfer: Simulated Toilet Transfer Method: Other (comment) (sit to stand from chair) Toileting - Clothing Manipulation: Simulated;Moderate assistance Where Assessed - Toileting Clothing Manipulation: Standing Toileting - Hygiene: Simulated;Moderate assistance Where Assessed - Toileting Hygiene: Standing ADL Comments: fatigue and anxiety very limiting. Pt will need significant rehab to increase I and return to PLOF  SLP Recommendations: Equipment Recommended: Defer to next venue    Prior Function: Level of Independence: Independent with basic ADLs Driving: Yes Vocation: Full time employment ADL Grooming: Simulated Where Assessed - Grooming: Standing at sink;Other (comment) (standing in front of recliner) Upper Body Bathing: Simulated;Moderate assistance Where Assessed - Upper Body Bathing: Unsupported;Sitting, chair Lower Body Bathing: Simulated;Maximal assistance Where Assessed - Lower Body Bathing: Sit to stand from chair Upper Body Dressing: Simulated;Moderate assistance Where Assessed - Lower Body Dressing: Unsupported;Sitting, chair Toilet Transfer: Simulated Toilet Transfer Method: Other (comment) (sit to stand from chair) Toileting - Clothing Manipulation: Simulated;Moderate assistance Where Assessed - Toileting Clothing Manipulation: Standing Toileting - Hygiene: Simulated;Moderate assistance Where Assessed - Toileting Hygiene: Standing ADL Comments: fatigue and anxiety very limiting. Pt will need significant rehab to increase I and return to PLOF  Additional Prior Functional Levels:  Bed Mobility: I Transfers: I Mobility - Walk/Wheelchair: I Upper Body Dressing: I Lower Body Dressing: I Grooming:  I Eating/Drinking: I Toilet Transfer: I Bladder Continence: WNL Bowel Management: WNL Stair Climbing: I Communication: WNL Memory: WNL Cooking/Meal Prep: I Housework: I Money  Management: I Driving: yes  Prior Activity Level: Community (5-7x/wk): Worked fulltime and was active  ADLs/Mobility: ADL Grooming: Simulated Where Assessed - Grooming: Standing at sink;Other (comment) (standing in front of recliner) Upper Body Bathing: Simulated;Moderate assistance Where Assessed - Upper Body Bathing: Unsupported;Sitting, chair Lower Body Bathing: Simulated;Maximal assistance Where Assessed - Lower Body Bathing: Sit to stand from chair Upper Body Dressing: Simulated;Moderate assistance Where Assessed - Lower Body Dressing: Unsupported;Sitting, chair Toilet Transfer: Simulated Toilet Transfer Method: Other (comment) (sit to stand from chair) Toileting - Clothing Manipulation: Simulated;Moderate assistance Where Assessed - Toileting Clothing Manipulation: Standing Toileting - Hygiene: Simulated;Moderate assistance Where Assessed - Toileting Hygiene: Standing ADL Comments: fatigue and anxiety very limiting. Pt will need significant rehab to increase I and return to PLOF  Bed Mobility Bed Mobility: Yes Supine to Sit: 4: Min assist;HOB elevated (Comment degrees) (HOB elevated 45' and increased time) Supine to Sit Details (indicate cue type and reason): HOB increased 45' and increased time Sit to Supine - Right (DO NOT USE): 1: +2 Total assist;HOB elevated (comment degrees);With rail Sit to Supine - Right Details DO NOT USE: Vcs safety, technique, hand placement. Increased time. Assist to control trunk and get LEs back onto bed. Transfers Transfers: Yes Sit to Stand: 1: +2 Total assist;From bed Sit to Stand Details (indicate cue type and reason): total assist + 2 pt 65% with 75% VC's on hand placement and increased time Stand to Sit: 2: Max assist;To chair/3-in-1 Stand to Sit Details: 75% VC's on hand placement as pt demon uncontrolled decend 2nd fatigue Ambulation/Gait Ambulation/Gait: Yes Ambulation/Gait Assistance: 1: +2 Total assist Ambulation/Gait Assistance  Details (indicate cue type and reason): Total assist + 2 pt 65% very limited act tolerance, shaky unsteady gait with 4/4 DOE.  Amb twice 6' then 4' on 3 lts On stats avg 92% chair following behind for safety and very limited act toleance. Ambulation Distance (Feet): 10 Feet Assistive device: Rolling walker Gait Pattern: Shuffle Gait velocity: very shaky unsteady gait with mild anxiety with activity Stairs: No Wheelchair Mobility Wheelchair Mobility: No Posture/Postural Control Posture/Postural Control: No significant limitations Balance Balance Assessed: Yes Static Sitting Balance Static Sitting - Balance Support: Bilateral upper extremity supported;Feet supported Static Sitting - Level of Assistance: 5: Stand by assistance Static Sitting - Comment/# of Minutes: Pt sat EOB 1-2 minutes with use of 1 hand rail and 1 hand support. No LOB. Fatigued easily (pt requesting to return to supine) Dynamic Sitting Balance Dynamic Sitting - Balance Support: Bilateral upper extremity supported Dynamic Sitting - Level of Assistance: 5: Stand by assistance Dynamic Sitting - Balance Activities: Forward lean/weight shifting Dynamic Sitting - Comments: Pt fearful of getting to EOB to get both feet flat on floor. Fatigued easily.   Home Assistive Devices/Equipment:  Home Assistive Devices/Equipment Home Assistive Devices/Equipment: None  Discharge Planning:  Living Arrangements: Alone Support Systems: Spouse/significant other Do you have any problems obtaining your medications?: No Type of Residence: Private residence Home Care Services: No Patient expects to be discharged to:: home Case Management Consult Needed: No  Previous Home Environment:  Living Arrangements: Alone Support Systems: Spouse/significant other Do you have any problems obtaining your medications?: No Type of Residence: Private residence Home Care Services: No Patient expects to be discharged to:: home Home Environment Number of  Levels: 1 (Ground floor apartment) Previous Home Environment Number of  Steps: 20 steps down to apt Previous Home Environment Is Bedroom on Main Floor?: Yes Previous Home Environment Is Bathroom on Main Floor?: Yes  Discharge Living Setting:  Plans for Discharge Living Setting: Patient's home;Apartment (Can go to [place] with boyfriend as needed, house 1 step ) Discharge Living Setting Number of Levels: 1 Discharge Living Setting Number of Steps: 20 steps down Discharge Living Setting is Bedroom on Main Floor?: Yes Discharge Living Setting is Bathroom on Main Floor?: Yes  Social/Family/Support Systems:  Patient Roles: Parent Contact Information: Zeynep Fantroy (c) [phone] (Dtr is NT with ortho and neuro experience) Anticipated Caregiver: Archie Patten - Dtr or Fernande Bras - boyfriend Anticipated Caregiver's Contact Information: Homero Fellers (c) [phone] Ability/Limitations of Caregiver: Dtr her from [place] for as long as needed.  Boyfriend retired and can Geneticist, molecular Availability: 24/7 Discharge Plan Discussed with Primary Caregiver: Yes Is Caregiver In Agreement with Plan?: Yes Does Caregiver/Family have Issues with Lodging/Transportation while Pt is in Rehab?: No  Goals/Additional Needs:  Patient/Family Goal for Rehab: PT/OT S/min A (ELOS = 2 plus weeks) Cultural Considerations: none Dietary Needs: Regular diet Equipment Needs: TBD Pt/Family Agrees to Admission and willing to participate: Yes Program Orientation Provided & Reviewed with Pt/Caregiver Including Roles  & Responsibilities: Yes (Provided to patient and daughter, Archie Patten)  Preadmission Screen Completed By:  Trish Mage, 09/02/2011 11:42 AM  Patient's condition:  This patient's condition remains as documented in the Consult dated 09/01/11, in which the Rehabilitation Physician determined and documented that the patient's condition is appropriate for intensive rehabilitative care in an inpatient rehabilitation  facility.  Preadmission Screen Competed NF:AOZHY Koleen Distance, RN, Time/Date,925/09/01/13.  Discussed status with Dr. Riley Kill on 09/02/11 at 1143 (time/date) and received telephone approval for admission today.  Admission Coordinator:  Trish Mage, time1143/Date01/08/13  And ais

## 2011-09-02 NOTE — Progress Notes (Signed)
Patient was give soap suds enema per order at 2000. Able to retain solution for about 5 minutes, then assisted onto bedpan. Patient had a large BM with both formed brown stool and watery return from the enema.

## 2011-09-02 NOTE — Progress Notes (Signed)
Patient vomited unmeasured emesis at 1600. States, "never happened before, just felt sick all of a sudden." Patient was eating dinner at the time. V/S taken: BP 175/99, T 98.8, P 88, R 18, O2 sat 93%. 1800 dose of cardizem given. Cipriano Mile, PA notified. LBM 12/28, soap suds enema ordered by P. Love, PA. Will give upon arrival. Patient states, "feeling better" at this time. Will continue with plan of care. Hedy Camara

## 2011-09-03 DIAGNOSIS — Z5189 Encounter for other specified aftercare: Secondary | ICD-10-CM

## 2011-09-03 DIAGNOSIS — J9600 Acute respiratory failure, unspecified whether with hypoxia or hypercapnia: Secondary | ICD-10-CM

## 2011-09-03 DIAGNOSIS — J449 Chronic obstructive pulmonary disease, unspecified: Secondary | ICD-10-CM

## 2011-09-03 DIAGNOSIS — G7281 Critical illness myopathy: Secondary | ICD-10-CM

## 2011-09-03 DIAGNOSIS — I4891 Unspecified atrial fibrillation: Secondary | ICD-10-CM

## 2011-09-03 DIAGNOSIS — R5381 Other malaise: Secondary | ICD-10-CM

## 2011-09-03 LAB — COMPREHENSIVE METABOLIC PANEL
ALT: 142 U/L — ABNORMAL HIGH (ref 0–35)
Albumin: 3 g/dL — ABNORMAL LOW (ref 3.5–5.2)
BUN: 13 mg/dL (ref 6–23)
Calcium: 8.5 mg/dL (ref 8.4–10.5)
GFR calc Af Amer: >90 mL/min (ref >90)
Glucose, Bld: 76 mg/dL (ref 70–99)
Sodium: 137 mEq/L (ref 135–145)
Total Protein: 5.4 g/dL — ABNORMAL LOW (ref 6.0–8.3)

## 2011-09-03 LAB — CBC
Hemoglobin: 12.8 g/dL (ref 12.0–15.0)
MCH: 30 pg (ref 26.0–34.0)
MCHC: 33.6 g/dL (ref 30.0–36.0)
RDW: 13.4 % (ref 11.5–15.5)

## 2011-09-03 LAB — GLUCOSE, CAPILLARY: Glucose-Capillary: 128 mg/dL — ABNORMAL HIGH (ref 70–99)

## 2011-09-03 LAB — DIFFERENTIAL
Lymphocytes Relative: 22 % (ref 12–46)
Lymphs Abs: 2 10*3/uL (ref 0.7–4.0)
Neutrophils Relative %: 64 % (ref 43–77)

## 2011-09-03 MED ORDER — FLUTICASONE-SALMETEROL 250-50 MCG/DOSE IN AEPB
1.0000 | INHALATION_SPRAY | Freq: Two times a day (BID) | RESPIRATORY_TRACT | Status: DC
  Administered 2011-09-03 – 2011-09-08 (×10): 1 via RESPIRATORY_TRACT
  Filled 2011-09-03: qty 14

## 2011-09-03 MED ORDER — ALBUTEROL SULFATE HFA 108 (90 BASE) MCG/ACT IN AERS
2.0000 | INHALATION_SPRAY | Freq: Four times a day (QID) | RESPIRATORY_TRACT | Status: DC | PRN
  Filled 2011-09-03: qty 6.7

## 2011-09-03 NOTE — Progress Notes (Signed)
Patient had a blood sugar of  69 at 2145 pm given small can of sprite and graham cracker, recheck blood sugar 113 at 2207 pm. Patient asymptomatic. Called Wray Kearns and made aware no new orders given at this time. Will continue to monitor patient.

## 2011-09-03 NOTE — Progress Notes (Signed)
Recreational Therapy Assessment and Plan  Patient Details  Name: [name] MRN: [id number] Date of Birth: 1946-04-06  Rehab Potential: Good ELOS: 10 days   Assessment Clinical Impression: Patient is a 66 y.o. year old female with recent admission to the hospital with past medical history of moderate persistent asthma as well as history of tobacco abuse. 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 Admitted December 29 with worsening shortness of breath, cough, sputum production for 2 days as well as fever of 102. She received albuterol treatment and found to have atrial fibrillation with rapid ventricular rate with heart rate the 200s. She was given adenosine x2 and placed on Cardizem drip per cardiology services Dr. Dietrich Pates. Chest x-ray showed worsening perihilar and infrahilar bronchitic changes versus early pneumonitis. She did require intubation and later extubated on January 3. Placed on broad-spectrum antibiotics including Tamiflu. Followup pulmonary services for chronic obstructive pulmonary disease exacerbation with acute respiratory failure due to flu and a Hemophilus pneumonia. Subcutaneous Lovenox added for deep vein thrombosis prophylaxis. Venous Dopplers lower extremity on December 30 were negative. She remains on prednisone 60 mg daily with plan for taper to 40 mg on January 7. Cardiac rate controlled on Cardizem 90 mg every 6 hours. Echocardiogram later completed that showed ejection fraction of 65% without emboli and normal systolic function. Currently on Rocephin 1 g at bedtime for pneumonia and changed to by mouth Avelox on 09/01/2011. Blood sugars with some elevation felt to be induced secondary to steroids and placed on Lantus insulin that has been adjusted as steroids tapered. Patient transferred to CIR on 09/02/2011 .   Patient presents with decreased activity tolerance, decreased functional mobility, and decreased balance limiting pt's independence  with leisure/community pursuits.  Recreational Therapy Leisure History/Participation Premorbid leisure interest/current participation: Community - Other (Comment);Nature - Fishing;Community - Grocery store;Community - Press photographer - Travel (Comment);Games - Cards;Sports - Systems analyst (Paediatric nurse for [employer], corn hole) Other Leisure Interests: Cooking/Baking;Television Leisure Participation Style: With Family/Friends Awareness of Community Resources: Good-identify 3 post discharge leisure resources [employer] Appropriate for Education?: Yes Patient Agreeable to [employer]?: Yes Stress Management: Good Does patient have pets?: Yes (cat) Social interaction - Mood/Behavior: Cooperative Recreational Therapy Orientation Orientation -Reviewed with patient: Available activity resources;Use of Dayroom Strengths/Weaknesses Patient Strengths/Abilities: Willingness to participate;Active premorbidly Patient weaknesses: Physical limitations  Plan Rec Therapy Plan Is patient appropriate for Therapeutic Recreation?: Yes Rehab Potential: Good Treatment times per week: min 1 time per week >20 minutes Estimated Length of Stay: 10 days Therapy Goals Achieved By:: Recreation/leisure participation;Community reintegration/education;1:1 session;Adaptive equipment instruction;Group participation (Comment);Patient/family education  Recommendations for other services: None  Discharge Criteria: Patient will be discharged from TR if patient refuses treatment 3 consecutive times without medical reason.  If treatment goals not met, if there is a change in medical status, if patient makes no progress towards goals or if patient is discharged from hospital.  The above assessment, treatment plan, treatment alternatives and goals were discussed and mutually agreed upon: by patient  Angelia Hazell 09/03/2011, 12:13 PM

## 2011-09-03 NOTE — Progress Notes (Signed)
Occupational Therapy Assessment and Plan & Session Note  Patient Details  Name: JONIQUA SIDLE MRN: [id number] Date of Birth: 1946/03/04  OT Diagnosis: muscle weakness (generalized) Rehab Potential: Rehab Potential: Good ELOS: 7-10 days   Today's Date: 09/03/2011  Assessment & Plan Clinical Impression: 66 year old right-handed white female with past medical history of moderate persistent asthma as well as history of tobacco abuse. 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 Admitted December 29 with worsening shortness of breath, cough, sputum production for 2 days as well as fever of 102. She received albuterol treatment and found to have atrial fibrillation with rapid ventricular rate with heart rate the 200s. She was given adenosine x2 and placed on Cardizem drip per cardiology services Dr. Dietrich Pates. Chest x-ray showed worsening perihilar and infrahilar bronchitic changes versus early pneumonitis. She did require intubation and later extubated on January 3. Placed on broad-spectrum antibiotics including Tamiflu. Followup pulmonary services for chronic obstructive pulmonary disease exacerbation with acute respiratory failure due to flu and a Hemophilus pneumonia. Subcutaneous Lovenox added for deep vein thrombosis prophylaxis. Venous Dopplers lower extremity on December 30 were negative. She remains on prednisone 60 mg daily with plan for taper to 40 mg on January 7. Cardiac rate controlled on Cardizem 90 mg every 6 hours. Echocardiogram later completed that showed ejection fraction of 65% without emboli and normal systolic function. Currently on Rocephin 1 g at bedtime for pneumonia and changed to by mouth Avelox on 09/01/2011. Blood sugars with some elevation felt to be induced secondary to steroids and placed on Lantus insulin that has been adjusted as steroids tapered. Patient transferred to CIR on 09/02/2011 .  Patient's past medical history is significant for:    Moderate asthma   Hypertension   Hypercholesteremia   Lumbar back pain   Anxiety   Patient currently requires min with basic self-care skills secondary to muscle weakness.  Prior to hospitalization, patient could complete ADLs and IADLs independently, patient even working and driving PTA.   Patient will benefit from skilled intervention to increase independence with basic self-care skills prior to discharge home independently.  Anticipate patient will require intermittent supervision and no additional OT recommended at this time.  OT - End of Session Activity Tolerance: Tolerates 30+ min activity with multiple rests OT Assessment Rehab Potential: Good Barriers to Discharge: Other (comment);Inaccessible home environment Barriers to Discharge Comments: 20 steps in/out of house OT Plan OT Frequency: 1-2 X/day, 60-90 minutes Estimated Length of Stay: 7-10 days OT Treatment/Interventions: Ambulation/gait training;Balance/vestibular training;Community reintegration;DME/adaptive equipment instruction;Functional mobility training;Pain management;Patient/family education;Self Care/advanced ADL retraining;Therapeutic Activities;Therapeutic Exercise;UE/LE Strength taining/ROM;UE/LE Coordination activities OT Recommendation Recommendations for Other Services:  (none at this time) Follow Up Recommendations: None Equipment Recommended:  (may need RW)  Precautions/Restrictions  Precautions Precautions: Fall Required Braces or Orthoses: No Restrictions Weight Bearing Restrictions: No  General Chart Reviewed: Yes  Pain Pain Assessment Pain Assessment: No/denies pain Pain Score: 0-No pain  Home Living/Prior Functioning Home Living Lives With: Alone Type of Home: Apartment Home Layout: One level Home Access: Stairs to enter Entrance Stairs-Rails:  (10 steps without rail (short steps), 10 normal stepswithrail) Entrance Stairs-Number of Steps: ~20 Bathroom Shower/Tub: Teacher, music: Standard Bathroom Accessibility: Yes How Accessible: Accessible via walker Home Adaptive Equipment: None IADL History Homemaking Responsibilities: Yes Meal Prep Responsibility: Primary Laundry Responsibility: Primary Cleaning Responsibility: Primary Bill Paying/Finance Responsibility: Primary Shopping Responsibility: Primary Current License: Yes Mode of Transportation: Car Occupation: Full time employment Type of Occupation:  Secretary at [employer] Leisure and Hobbies: Traveling Prior Function Level of Independence: Independent with basic ADLs;Independent with homemaking with wheelchair;Independent with gait;Independent with transfers Able to Take Stairs?: Yes Driving: Yes  ADL - See FIM   Vision/Perception  Vision - History Baseline Vision: Wears glasses only for reading Patient Visual Report: No change from baseline Perception Perception: Within Functional Limits   Cognition Overall Cognitive Status: Appears within functional limits for tasks assessed Arousal/Alertness: Awake/alert Orientation Level: Oriented X4  Sensation Sensation Light Touch: Appears Intact Proprioception: Appears Intact  Motor  Motor Motor: Other (comment) (generalized muscle weakness)  Mobility  Bed Mobility Bed Mobility: Yes Supine to Sit: 5: Supervision Transfers Transfers: Yes Sit to Stand: 4: Min assist Sit to Stand Details: Tactile cues for posture;Verbal cues for technique Stand to Sit: 4: Min assist Stand to Sit Details (indicate cue type and reason): Verbal cues for sequencing   Trunk/Postural Assessment  Cervical Assessment Cervical Assessment: Within Functional Limits Thoracic Assessment Thoracic Assessment: Within Functional Limits Lumbar Assessment Lumbar Assessment: Within Functional Limits Postural Control Postural Control:  (mildly flexed posture with standing and gait,correctswithcue)   Balance Balance Balance Assessed: Yes Static Sitting  Balance Static Sitting - Level of Assistance: 6: Modified independent (Device/Increase time) Dynamic Sitting Balance Dynamic Sitting - Level of Assistance: 6: Modified independent (Device/Increase time) Static Standing Balance Static Standing - Level of Assistance: 4: Min assist Dynamic Standing Balance Dynamic Standing - Level of Assistance: 4: Min assist  Extremity/Trunk Assessment RUE Assessment RUE Assessment: Within Functional Limits LUE Assessment LUE Assessment: Within Functional Limits (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  The above assessment, treatment plan, treatment alternatives and goals were discussed and mutually agreed upon: by patient  Session Note  0730 - 0825 - 55 Minutes Individual Therapy No complaints of pain  Found patient supine in bed upon arrival. Initial 1:1 OT Evaluation completed. Engaged in bed mobility for supine to sit and edge of bed -> w/c steady assist transfer. In sit to stand position completed ADL retraining at sink level; focusing on UB/LB bathing & dressing, sit to stands, and increasing overall activity tolerance/endurance. Also engaged in functional ambulation using rolling walker -> elevated toilet seat and toileting (clothe management & perirenal hygiene). Patient's daughter present at end of session and she stated her main concern was steps for in/out of patients apartment upon discharge. Left patient seated in w/c with call bell and phone within reach.   Hilaria Titsworth,Leonette 09/03/2011, 9:53 AM

## 2011-09-03 NOTE — Progress Notes (Signed)
Patient information reviewed and entered into [facility] system by Merlyn Bollen, RN, CRRN, PPS Coordinator.  Information including medical coding and functional independence measure will be reviewed and updated through discharge.    

## 2011-09-03 NOTE — Progress Notes (Signed)
Patient ID: [name], female   DOB: 1946/02/20, 66 y.o.   MRN: [id number] Subjective/Complaints: Review of Systems  Respiratory: Positive for cough.   All other systems reviewed and are negative.  large bm last night with enema. Did well otherwise   Objective: Vital Signs: Blood pressure 153/91, pulse 84, temperature 98.7 F (37.1 C), temperature source Oral, resp. rate 18, height 5\' 2"  (1.575 m), weight 74.4 kg (164 lb 0.4 oz), SpO2 95.00%. No results found.  Basename 09/03/11 0637  WBC 9.1  HGB 12.8  HCT 38.1  PLT 196    Basename 09/02/11 0528 09/01/11 0520  NA 136 135  K 3.7 3.0*  CL 99 98  CO2 31 32  GLUCOSE 74 78  BUN 12 15  CREATININE 0.41* 0.40*  CALCIUM 8.2* 7.9*   CBG (last 3)   Basename 09/03/11 0717 09/02/11 2143 09/02/11 1641  GLUCAP 82 113* 146*    Wt Readings from Last 3 Encounters:  09/02/11 74.4 kg (164 lb 0.4 oz)  09/02/11 73 kg (160 lb 15 oz)  04/21/11 73.755 kg (162 lb 9.6 oz)    Physical Exam:  General appearance: alert, cooperative and no distress Head: Normocephalic, without obvious abnormality, atraumatic Eyes: conjunctivae/corneas clear. PERRL, EOM's intact. Fundi benign. Ears: normal TM's and external ear canals both ears Nose: Nares normal. Septum midline. Mucosa normal. No drainage or sinus tenderness. Throat: lips, mucosa, and tongue normal; teeth and gums normal Neck: no adenopathy, no carotid bruit, no JVD, supple, symmetrical, trachea midline and thyroid not enlarged, symmetric, no tenderness/mass/nodules Back: symmetric, no curvature. ROM normal. No CVA tenderness. Resp: rhonchi bilaterally Cardio: regular rate and rhythm, S1, S2 normal, no murmur, click, rub or gallop GI: soft, non-tender; bowel sounds normal; no masses,  no organomegaly Extremities: extremities normal, atraumatic, no cyanosis or edema Pulses: 2+ and symmetric Skin: Skin color, texture, turgor normal. No rashes or lesions Neurologic: Grossly normal with  intact cognition.  Still LE weakness 3+ prox to 4/5 distally Incision/Wound: none   Assessment/Plan: 1. Functional deficits secondary to severe deconditioing, critical illness myopathy which require 3+ hours per day of interdisciplinary therapy in a comprehensive inpatient rehab setting. Physiatrist is providing close team supervision and 24 hour management of active medical problems listed below. Physiatrist and rehab team continue to assess barriers to discharge/monitor patient progress toward functional and medical goals.  Evals today.  Patient is progressing nicely.  Mobility:         ADL:   Cognition: Cognition Orientation Level: Oriented X4 Cognition Orientation Level: Oriented X4  1. Severe deconditioning/COPD exacerbation and Haemophilus pneumonia/critical illness myopathy. Continue nebulizer treatments as advised. Taper prednisone as per pulmonary services. Continue Avelox 400 mg daily as of January 8. Cough decreasing. 2. DVT Prophylaxis/Anticoagulation: Subcutaneous Lovenox . Monitor platelet counts and any signs of bleeding  3. Acute onset atrial fibrillation. Continue cardizem as advised per cardiology services. Monitor with increased activity. Heart rate-controlled on evaluation today  4. Hyperglycemia. Patient no history of diabetes mellitus. Blood sugars induced secondary steroids. Maintained on Lantus insulin. Steroid taper.  Sugars reasonable at present. 5 History of tobacco abuse. Provide counseling as appropriate      LOS (Days) 1 A FACE TO FACE EVALUATION WAS PERFORMED  SWARTZ,ZACHARY T 09/03/2011, 7:34 AM

## 2011-09-03 NOTE — Progress Notes (Signed)
Physical Therapy Assessment and Plan & Treatment Note  Patient Details  Name: [name] MRN: [id number] Date of Birth: [birth year]  PT Diagnosis: Abnormality of gait, Difficulty walking and Muscle weakness Rehab Potential: Good ELOS: 7-10 days    Today's Date: 09/03/2011 Time: 4098-119 Time Calculation (min): 55 min  Assessment & Plan Clinical Impression: Patient is a 66 y.o. year old female with recent admission to the hospital with past medical history of moderate persistent asthma as well as history of tobacco abuse. 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 Admitted December 29 with worsening shortness of breath, cough, sputum production for 2 days as well as fever of 102. She received albuterol treatment and found to have atrial fibrillation with rapid ventricular rate with heart rate the 200s. She was given adenosine x2 and placed on Cardizem drip per cardiology services Dr. Dietrich Pates. Chest x-ray showed worsening perihilar and infrahilar bronchitic changes versus early pneumonitis. She did require intubation and later extubated on January 3. Placed on broad-spectrum antibiotics including Tamiflu. Followup pulmonary services for chronic obstructive pulmonary disease exacerbation with acute respiratory failure due to flu and a Hemophilus pneumonia. Subcutaneous Lovenox added for deep vein thrombosis prophylaxis. Venous Dopplers lower extremity on December 30 were negative. She remains on prednisone 60 mg daily with plan for taper to 40 mg on January 7. Cardiac rate controlled on Cardizem 90 mg every 6 hours. Echocardiogram later completed that showed ejection fraction of 65% without emboli and normal systolic function. Currently on Rocephin 1 g at bedtime for pneumonia and changed to by mouth Avelox on 09/01/2011. Blood sugars with some elevation felt to be induced secondary to steroids and placed on Lantus insulin that has been adjusted as steroids tapered.   Patient transferred to CIR on 09/02/2011 .   Patient currently requires min with mobility secondary to muscle weakness, decreased cardiorespiratoy endurance and decreased standing balance and decreased balance strategies.  Prior to hospitalization, patient was independent with mobility and lived with Alone in a Apartment home.  Home access is ~20Stairs to enter. There are 10 shorter steps without a rail, and 10 "tall" steps with 1 rail on right. Daughter will stay with pt as long as needed.  Patient will benefit from skilled PT intervention to maximize safe functional mobility, minimize fall risk and decrease caregiver burden for planned discharge home with 24 hour supervision.  Anticipate patient will benefit from follow up OP at discharge.  PT - End of Session Activity Tolerance: Tolerates 30+ min activity with multiple rests PT Assessment Rehab Potential: Good PT Plan PT Frequency: 1-2 X/day, 60-90 minutes Estimated Length of Stay: 7-10 days  PT Treatment/Interventions: Ambulation/gait training;Balance/vestibular training;Community reintegration;DME/adaptive equipment instruction;Functional mobility training;Neuromuscular re-education;Pain management;Patient/family education;Splinting/orthotics;Stair training;Therapeutic Activities;Therapeutic Exercise;UE/LE Strength taining/ROM;UE/LE Coordination activities;Visual/perceptual remediation/compensation;Wheelchair propulsion/positioning PT Recommendation Follow Up Recommendations: Outpatient PT Equipment Recommended:  (may need RW)  Precautions/Restrictions Precautions Precautions: Fall Required Braces or Orthoses: No Restrictions Weight Bearing Restrictions: No  Vital Signs Therapy Vitals BP: 143/85 mmHg Patient Position, if appropriate: Sitting Oxygen Therapy SpO2: 97 % O2 Device: None (Room air) Pain Pain Assessment Pain Assessment: No/denies pain Pain Score: 0-No pain Home Living/Prior Functioning Home Living Lives With:  Alone Type of Home: Apartment Home Layout: One level Home Access: Stairs to enter Entrance Stairs-Rails:  (10 steps without rail (short steps), 10 normal stepswithrail) Entrance Stairs-Number of Steps: ~20 Bathroom Shower/Tub: Engineer, manufacturing systems: Standard Bathroom Accessibility: Yes How Accessible: Accessible via walker Home Adaptive Equipment: None Prior  Function Level of Independence: Independent with basic ADLs;Independent with homemaking with wheelchair;Independent with gait;Independent with transfers Able to Take Stairs?: Yes Driving: Yes Vision/Perception  Vision - History Baseline Vision: Wears glasses only for reading Patient Visual Report: No change from baseline Perception Perception: Within Functional Limits  Cognition Overall Cognitive Status: Appears within functional limits for tasks assessed Arousal/Alertness: Awake/alert Orientation Level: Oriented X4 Sensation Sensation Light Touch: Appears Intact Proprioception: Appears Intact Motor  Motor Motor: Other (comment) (generalized muscle weakness)  Mobility Bed Mobility Bed Mobility: Yes Supine to Sit: 5: Supervision Transfers Transfers: Yes Sit to Stand: 4: Min assist Sit to Stand Details: Tactile cues for posture;Verbal cues for technique Stand to Sit: 4: Min assist Stand to Sit Details (indicate cue type and reason): Verbal cues for sequencing Stand Pivot Transfers: 4: Min assist Locomotion  Ambulation Ambulation/Gait Assistance: 4: Min assist (HHA) Ambulation Distance (Feet): 25 Feet Gait Gait Pattern: Right steppage;Left steppage Stairs / Additional Locomotion Stairs: Yes Stairs Assistance: 4: Min assist Stair Management Technique: Two rails;One rail Right Number of Stairs: 5  (repeated second time with 1 rail on R)  Trunk/Postural Assessment  Cervical Assessment Cervical Assessment: Within Functional Limits Thoracic Assessment Thoracic Assessment: Within Functional  Limits Lumbar Assessment Lumbar Assessment: Within Functional Limits Postural Control Postural Control:  (mildly flexed posture with standing and gait,correctswithcue)  Balance Balance Balance Assessed: Yes Static Sitting Balance Static Sitting - Level of Assistance: 6: Modified independent (Device/Increase time) Dynamic Sitting Balance Dynamic Sitting - Level of Assistance: 6: Modified independent (Device/Increase time) Static Standing Balance Static Standing - Level of Assistance: 4: Min assist Dynamic Standing Balance Dynamic Standing - Level of Assistance: 4: Min assist Extremity Assessment  RUE Assessment RUE Assessment: Within Functional Limits LUE Assessment LUE Assessment: Within Functional Limits (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  The above assessment, treatment plan, treatment alternatives and goals were discussed and mutually agreed upon: by patient and by family   Individual therapy treatment initiated with focus on gait training without AD for increased challenge of balance and functional mobility training, minA overall. Stair training for home entry (will be the biggest challenge for pt due to activity tolerance, strength, and no railings on 10/20 steps). Daughter appears very supportive and willing to assist in anyway. Nustep for LE strengthening and general activity tolerance. Pt with complaints of dizziness and shakiness (in hands) throughout therapy, limiting gait distance at a  time, but able to recover within a few seconds of sitting. Karolee Stamps [facility] 09/03/2011, 9:29 AM

## 2011-09-03 NOTE — Progress Notes (Signed)
Occupational Therapy Session Note  Patient Details  Name: [name] MRN: [id number] Date of Birth: [birth year]  Today's Date: 09/03/2011 Time: 1191-4782 Time Calculation (min): 30 min  Precautions: Precautions Precautions: Fall Required Braces or Orthoses: No Restrictions Weight Bearing Restrictions: No   Skilled Therapeutic Interventions/Progress Updates:  Pt practiced tub transfers with tub bench with min A to step into tub and min A to get out of tub using tub bench with sit to stand from edge of tub bench.  Pt completed toilet transfer with steady assist.  Pt amb with r/w to complete home mgmt tasks with steady assist.  Pt required min verbal cues for UE positioning with sit to stand/stand to sit and r/w positioning when sitting.  Focus on activity tolerance and  safety awareness.  Pt required rest breaks X 5.  Pt's dtr present to observe.  Pain Pain Assessment Pain Assessment: No/denies pain  Therapy/Group: Individual Therapy  Rich Brave 09/03/2011, 3:28 PM

## 2011-09-03 NOTE — Progress Notes (Signed)
[facility] Individual Statement of Services  Patient Name:  [name]  Date:  09/03/2011  Welcome to the [facility].  Our goal is to provide you with an individualized program based on your diagnosis and situation, designed to meet your specific needs.  With this comprehensive rehabilitation program, you will be expected to participate in at least 3 hours of rehabilitation therapies Monday-Friday, with modified therapy programming on the weekends.  Your rehabilitation program will include the following services:  Physical Therapy (PT), Occupational Therapy (OT), 24 hour per day rehabilitation nursing, Therapeutic Recreaction (TR), Case Management (RN and Social Worker), Rehabilitation Medicine, Nutrition Services and Pharmacy Services  Weekly team conferences will be held on  Tuesday afternoon  to discuss your progress.  Your RN Case Designer, television/film set will talk with you frequently to get your input and to update you on team discussions.  Team conferences with you and your family in attendance may also be held.  Depending on your progress and recovery, your program may change.  Your RN Case Estate agent will coordinate services and will keep you informed of any changes.  Your RN Sports coach and SW names and contact numbers are listed  below.  The following services may also be recommended but are not provided by the Inpatient [facility]:   Driving Evaluations  Home Health Rehabiltiation Services  Outpatient Rehabilitatation [facility]  Vocational Rehabilitation   Arrangements will be made to provide these services after discharge if needed.  Arrangements include referral to agencies that provide these services.  Your insurance has been verified to be:  [employer]] Your primary doctor is:  Dr. Alroy Dust  Pertinent information will be shared with your doctor and your insurance company.  Case Manager:  Melanee Spry, [facility] [phone]  Social Worker:  Amada Jupiter, [place] [phone]  ELOS: 7-10 days                           Goals: Modified Independent, except, supervision tub/shower transfers & min assist                                                                                                                                                                  on stairs   Information discussed with and copy given to patient by: Brock Ra, 09/03/2011, 2:52 PM

## 2011-09-03 NOTE — Progress Notes (Signed)
CBG: 69  Treatment: 15 GM carbohydrate snack  Symptoms: None  Follow-up CBG: Time:2207 CBG Result:113 Possible Reasons for Event: Unknown  Comments/MD notified: [name] aware no new orders given at this time    [name], [name]

## 2011-09-03 NOTE — Progress Notes (Signed)
Physical Therapy Session Note  Patient Details  Name: [name] MRN: [id number] Date of Birth: [birth year]  Today's Date: 09/03/2011 Time:  -     Precautions: Precautions Precautions: Fall Required Braces or Orthoses: No Restrictions Weight Bearing Restrictions: No  Short Term Goals:    Skilled Therapeutic Interventions/Progress Updates:      General   Vital Signs Therapy Vitals Pulse Rate: 105  BP: 156/89 mmHg Patient Position, if appropriate: Sitting Oxygen Therapy SpO2: 98 % O2 Device: None (Room air) Pain pt denies pain   Mobility gait training with rw minguard assist 150'        Balance Standardized Balance Assessment Standardized Balance Assessment: Berg Balance Test Berg Balance Test Sit to Stand: Able to stand  independently using hands Standing Unsupported: Able to stand 30 seconds unsupported Sitting with Back Unsupported but Feet Supported on Floor or Stool: Able to sit safely and securely 2 minutes Stand to Sit: Sits independently, has uncontrolled descent Transfers: Needs one person to assist Standing Unsupported with Eyes Closed: Able to stand 10 seconds with supervision Standing Ubsupported with Feet Together: Able to place feet together independently and stand for 1 minute with supervision From Standing Position, Pick up Object from Floor: Able to pick up shoe, needs supervision Turn 360 Degrees: Needs close supervision or verbal cueing  Pt. Unable to tolerate completing BERG, but demonstrates high risk of falls on activities completed. Pt complained of turning bending and reaching making her dizzy. She states she feels fatigue is fatigue related.     Other Treatments  Tonya, daughter, was taught how to guard pt and given the ok to assist in room.  Therapy/Group: Individual Therapy  Julian Reil 09/03/2011, 1:55 PM

## 2011-09-04 LAB — GLUCOSE, CAPILLARY
Glucose-Capillary: 71 mg/dL (ref 70–99)
Glucose-Capillary: 159 mg/dL — ABNORMAL HIGH (ref 70–99)
Glucose-Capillary: 224 mg/dL — ABNORMAL HIGH (ref 70–99)

## 2011-09-04 MED ORDER — INSULIN GLARGINE 100 UNIT/ML ~~LOC~~ SOLN: 5.0000 [IU] | Freq: Every day | SUBCUTANEOUS | Status: DC

## 2011-09-04 MED ORDER — SENNOSIDES-DOCUSATE SODIUM 8.6-50 MG PO TABS
1.0000 | ORAL_TABLET | Freq: Every day | ORAL | Status: DC
  Administered 2011-09-04 – 2011-09-07 (×5): 1 via ORAL
  Filled 2011-09-04 (×4): qty 1

## 2011-09-04 MED ORDER — IPRATROPIUM BROMIDE 0.02 % IN SOLN: 0.5000 mg | Freq: Four times a day (QID) | RESPIRATORY_TRACT | Status: DC | PRN

## 2011-09-04 NOTE — Progress Notes (Signed)
Social Work Assessment and Plan Assessment and Plan  Patient Name: [name]  ZOXWR'U Date: 09/04/2011  Problem List:  Patient Active Problem List  Diagnoses  . HYPERCHOLESTEROLEMIA  . ANXIETY  . HYPERTENSION  . ASTHMA, MODERATE  . BACK PAIN, LUMBAR  . PERS HX NONCOMPLIANCE W/MED TX PRS HAZARDS HLTH  . Physical deconditioning    Past Medical History:  Past Medical History  Diagnosis Date  . Moderate asthma   . Hypertension   . Hypercholesteremia   . Lumbar back pain   . Anxiety     Past Surgical History:  Past Surgical History  Procedure Date  . Tonsillectomy and adnoidectomy   . Cholecystectomy   . Left thumb cmc arthroplasty 11/2010    by DrGramig    Discharge Planning  Discharge Planning Support Systems: Children (daughter here from [place] to stay indefinitely) Expected Discharge Date:  (elos 1 week) Case Management Consult Needed: Yes (Comment) (following)  Social/Family/Support Systems Social/Family/Support Systems Anticipated Caregiver:  (pt is widowed x 13 years but with this boyfriend x 10 yrs )  Employment Status Employment Status Employment Status: Employed Name of Employer: [employer] Length of Employment: 37  Return to Work Plans: "as soon as I can...we're like a family there and I want to get back" Legal Hisotry/Current Legal Issues: none  Abuse/Neglect    Emotional Status Emotional Status Pt's affect, behavior adn adjustment status: pleasant, talkative woman who notes she is "floored how fast everything fell apart" but optimistic about having a full recovery and return to work and normal lifestyle.  Denies any emotional distress overall.  No s/s of depression or anxiety noted , therefore, Beck Depression screen deferred at this time but will monitor throughout stay. Recent Psychosocial Issues: None Pyschiatric History: None  Patient/Family Perceptions, Expectations & Goals Pt/Family Perceptions, Expectations and  Goals Pt/Family understanding of illness & functional limitations: pt and daughter with good, basic understanding of medical issues/ course pt has gone through and current level of debility and need for CIR Premorbid pt/family roles/activities: pt was completely independent woman and working full-time; active at [employer] level Anticipated changes in roles/activities/participation: daughter to assume caregiver role to the extent pt needs and "until she is back 150%" Pt/family expectations/goals: "I want to build my strength back up and get back to work"  [employer] [employer]: None Premorbid Home Care/DME Agencies: None Transportation available at discharge: yes  Discharge Visual merchandiser Resources: Media planner (specify) ([place] [facility]) Financial Resources: Employment Financial Screen Referred: No Living Expenses: Rent Money Management: Patient Home Management: pt Patient/Family Preliminary Plans: pt to return to her own home with daughter staying indefinitely and providing 24/7 assist as needed;  boyfriend to assist intermittently (he lives in [place])  Clinical Impression:  Pleasant, talkative and fully oriented woman lying in bed with daughter at bedside.  Very motivated to do therapy and be able to return to her job at [employer].  Denies any emotional distress but will monitor throughout CIR stay.  Megan Salon, Maksym Pfiffner 09/04/2011

## 2011-09-04 NOTE — Progress Notes (Addendum)
Physical Therapy Note  Patient Details  Name: [name] MRN: [id number] Date of Birth: April 23, 1946 Today's Date: 09/04/2011 1510- Pt missed 10 minutes of afternoon PT session d/t fatigue.   Michaelene Song 09/04/2011, 3:12 PM

## 2011-09-04 NOTE — Progress Notes (Signed)
Inpatient Diabetes Program Recommendations  AACE/ADA: New Consensus Statement on Inpatient Glycemic Control (2009)  Target Ranges:  Prepandial:   less than 140 mg/dL      Peak postprandial:   less than 180 mg/dL (1-2 hours)      Critically ill patients:  140 - 180 mg/dL   Reason for Visit: Results for MERYN, SARRACINO (MRN [id number]) as of 09/04/2011 11:43  Ref. Range 09/03/2011 11:16 09/03/2011 16:07 09/03/2011 21:45 09/03/2011 22:07 09/04/2011 07:12  Glucose-Capillary Latest Range: 70-99 mg/dL 409 (H) 811 (H) 69 (L) 113 (H) 71    Inpatient Diabetes Program Recommendations Correction (SSI): Reduce correction scale to sensitive tid with meals Insulin - Meal Coverage: Consider adding Novolog meal coverage 2 units tid with meals (while on PO prednisone)  Note: Will follow.

## 2011-09-04 NOTE — Progress Notes (Signed)
Physical Therapy Note  Patient Details  Name: [name] MRN: [id number] Date of Birth: [birth year] Today's Date: 09/04/2011  Individual therapy 830-925 (55 minutes) Denies pain. Noted mild left foot drop, pt reports this is chronic and related to back issues she has had for years and does not wear brace. Gait with RW with supervision x 150' to gym, focus on posture and cadence. Dynamic standing balance activity on compliant surface for ball toss with bilateral UEs, steady A, increased challenge with foam mat with initial one upper extremity support progressing to no upper extremity support to tap and toss/catch ball back and forth. Dynamic gait through obstacle course with close supervision with RW, cueing for safety with AD. Practiced gait without AD, close supervision this AM, cueing for posture. Introduced single point cane, pt initially steady A but progressed to close supervision > 150' with good cadence noted. Pt prefers to use cane in Right hand. Stair training with 1 rail with close supervision x8 steps, minA (HHA) for 4 steps without railings. Simulated car transfer modified independent.  Pt with great improvement today with activity tolerance and overall gait/balance. Recommend to continue to use cane and no AD during therapies.  Karolee Stamps [facility] 09/04/2011, 8:57 AM

## 2011-09-04 NOTE — Progress Notes (Signed)
Physical Therapy Note  Patient Details  Name: [name] MRN: [id number] Date of Birth: 1945-09-01 Today's Date: 09/04/2011  1610-1635 (25 minutes) individual treatment session Pain- no complaint of pain Vitals: Oxygen Sats 94% RA pulse 94 (resting) Focus of treatment: Therapeutic activities to improve tolerance to activity monitoring heart rate Treatment: Nustep Level 3 X 5 minutes using bilateral upper and lower extremities- Oxygen Sats 98% RA, pulse 110 post 5 minutes; Nustep LEs only Level 3 X 2 minutes before stopping secondary to fatigue Gait: 120 feet close supervision using SPC X 2   [name],JIM 09/04/2011, 4:40 PM

## 2011-09-04 NOTE — Progress Notes (Signed)
Physical Therapy Session Note  Patient Details  Name: SARALYNN LANGHORST MRN: [id number] Date of Birth: 01-Nov-1945  Today's Date: 09/04/2011 TIME IN/OUT  14:30-15:05  Precautions: Precautions Precautions: Fall Required Braces or Orthoses: No Restrictions Weight Bearing Restrictions: No     Skilled Therapeutic Interventions/Progress Updates:    High level gait and balance challenges, difficulty with divided attention task while walking and had to stop if sidestepping or going backwards; very fatigued at end of session and required increased assist with transfer from kitchen chair, assisted BTB Daughter present- PT demonstrated and discussed floor transfer for fall recovery but pt too fatigued to perform  General Chart Reviewed: Yes Vital Signs Therapy Vitals Pulse Rate: 116  (with gait) Pain Pain Assessment Pain Assessment: No/denies pain Mobility Transfers with feet staggered to increase strength in RLE which pt feels is weaker Transfers Sit to Stand: 5: Supervision (repetitive for strengthening without UE use from 20 inch ) Sit to Stand Details (indicate cue type and reason): required min A even with BUE use with fatigue Stand to Sit: 5: Supervision;Without upper extremity assist Locomotion  Ambulation Ambulation/Gait Assistance: 4: Min assist Ambulation Distance (Feet): 125 Feet Assistive device: Straight cane High Level Ambulation High Level Ambulation: Other high level ambulation (alphabet skipping every other letter) Backwards Walking: pt unable to talk and go backwards simutaneously , 150, 50          Other Treatments Treatments Therapeutic Activity: activity in kitchen using BUE's in standing with close S, increased UE tremors with fatigue  Therapy/Group: Individual Therapy  Michaelene Song 09/04/2011, 3:07 PM

## 2011-09-04 NOTE — Progress Notes (Signed)
Occupational Therapy Note  Patient Details  Name: [name] MRN: [id number] Date of Birth: 10-20-1945 Today's Date: 09/04/2011  1100-1155 - 55 Minutes Individual Therapy No complaints of pain  Upon entering room patient seated in chair with clothes donned. Nursing completed bath, daughter assisted patient with dressing. Focused on functional ambulation -> ADL apartment using cane with intermittent steady assist. Once in ADL apartment patient requested break. After rest break, engaged in shower seat transfer in tub/shower with minimal assistance from therapist. Per daughter's report, they have some equipment for home use but unsure exactly what it looks like secondary to in storage unit from where grandmother used it. Also engaged in kitchen task in ADL apartment for focus on increasing overall activity tolerance/endurance, dynamic standing balance/tolerance/endurance, and safety during IADL tasks. Patient fatigued about halfway through kitchen task and required multiple rest breaks. Encouraged patient to use stool at home when needed. Taught patient energy conservation techniques. Patient's daughter present for entire session with encouragement to mother and appropriate conversation with mother and therapist. Patient requested to use w/c to get back to room.   Torrell Krutz,Emmarae 09/04/2011, 12:01 PM

## 2011-09-04 NOTE — Progress Notes (Signed)
Patient ID: LADY WISHAM, female   DOB: [birth year], 66 y.o.   MRN: [id number] Patient ID: REMA LIEVANOS, female   DOB: 12-16-1945, 66 y.o.   MRN: [id number] Subjective/Complaints: Review of Systems  Respiratory: Positive for cough.   All other systems reviewed and are negative.  tired by the end of therapy yesterday but encouraged by progress 1/10   Objective: Vital Signs: Blood pressure 146/76, pulse 79, temperature 98.2 F (36.8 C), temperature source Oral, resp. rate 19, height 5\' 2"  (1.575 m), weight 74.4 kg (164 lb 0.4 oz), SpO2 95.00%. No results found.  Basename 09/03/11 0637  WBC 9.1  HGB 12.8  HCT 38.1  PLT 196    Basename 09/03/11 0637 09/02/11 0528  NA 137 136  K 3.9 3.7  CL 100 99  CO2 30 31  GLUCOSE 76 74  BUN 13 12  CREATININE 0.49* 0.41*  CALCIUM 8.5 8.2*   CBG (last 3)   Basename 09/04/11 0712 09/03/11 2207 09/03/11 2145  GLUCAP 71 113* 69*    Wt Readings from Last 3 Encounters:  09/02/11 74.4 kg (164 lb 0.4 oz)  09/02/11 73 kg (160 lb 15 oz)  04/21/11 73.755 kg (162 lb 9.6 oz)    Physical Exam:  General appearance: alert, cooperative and no distress Head: Normocephalic, without obvious abnormality, atraumatic Eyes: conjunctivae/corneas clear. PERRL, EOM's intact. Fundi benign. Ears: normal TM's and external ear canals both ears Nose: Nares normal. Septum midline. Mucosa normal. No drainage or sinus tenderness. Throat: lips, mucosa, and tongue normal; teeth and gums normal Neck: no adenopathy, no carotid bruit, no JVD, supple, symmetrical, trachea midline and thyroid not enlarged, symmetric, no tenderness/mass/nodules Back: symmetric, no curvature. ROM normal. No CVA tenderness. Resp: rhonchi bilaterally, an occasional wheeze. Breathing non labored. Cardio: regular rate and rhythm, S1, S2 normal, no murmur, click, rub or gallop GI: soft, non-tender; bowel sounds normal; no masses,  no organomegaly Extremities: extremities normal, atraumatic,  no cyanosis or edema Pulses: 2+ and symmetric Skin: Skin color, texture, turgor normal. No rashes or lesions Neurologic: Grossly normal with intact cognition.  Still LE weakness 3+ prox to 4/5 distally Incision/Wound: none Exam updated 1/10   Assessment/Plan: 1. Functional deficits secondary to severe deconditioing, critical illness myopathy which require 3+ hours per day of interdisciplinary therapy in a comprehensive inpatient rehab setting. Physiatrist is providing close team supervision and 24 hour management of active medical problems listed below. Physiatrist and rehab team continue to assess barriers to discharge/monitor patient progress toward functional and medical goals.  IPOC to be completed today.  Mobility: Bed Mobility Bed Mobility: Yes Supine to Sit: 5: Supervision Transfers Transfers: Yes Sit to Stand: 4: Min assist Stand to Sit: 4: Min assist Stand Pivot Transfers: 4: Min assist Ambulation/Gait Ambulation/Gait Assistance: 4: Min assist (HHA) Ambulation Distance (Feet): 25 Feet Gait Pattern: Right steppage;Left steppage Stairs: Yes Stairs Assistance: 4: Min assist Stair Management Technique: Two rails;One rail Right Number of Stairs: 5  (repeated second time with 1 rail on R)   ADL:   Cognition: Cognition Overall Cognitive Status: Appears within functional limits for tasks assessed Arousal/Alertness: Awake/alert Orientation Level: Oriented X4 Cognition Arousal/Alertness: Awake/alert Orientation Level: Oriented X4  1. Severe deconditioning/COPD exacerbation and Haemophilus pneumonia/critical illness myopathy. Continue nebulizer treatments as advised. Taper prednisone as per pulmonary services. Continue Avelox 400 mg daily as of January 8. Cough decreasing.  -will wean from nebs   -on advair and proventil at home 2. DVT Prophylaxis/Anticoagulation: Subcutaneous Lovenox . Monitor platelet counts  and any signs of bleeding  3. Acute onset atrial  fibrillation. Continue cardizem as advised per cardiology services. Monitor with increased activity. Heart rate-controlled on evaluation today  4. Hyperglycemia. Patient no history of diabetes mellitus. Blood sugars induced secondary steroids. Wean  Lantus insulin. Steroid taper.  Sugars reasonable at present.   5 History of tobacco abuse. Provide counseling as appropriate      LOS (Days) 2 A FACE TO FACE EVALUATION WAS PERFORMED  Shriya Aker T 09/04/2011, 8:45 AM

## 2011-09-05 LAB — GLUCOSE, CAPILLARY
Glucose-Capillary: 127 mg/dL — ABNORMAL HIGH (ref 70–99)
Glucose-Capillary: 196 mg/dL — ABNORMAL HIGH (ref 70–99)
Glucose-Capillary: 175 mg/dL — ABNORMAL HIGH (ref 70–99)

## 2011-09-05 MED ORDER — PREDNISONE 20 MG PO TABS
20.0000 mg | ORAL_TABLET | Freq: Every day | ORAL | Status: DC
  Administered 2011-09-07 – 2011-09-08 (×2): 20 mg via ORAL
  Filled 2011-09-05 (×3): qty 1

## 2011-09-05 MED ORDER — PREDNISONE 20 MG PO TABS
30.0000 mg | ORAL_TABLET | Freq: Every day | ORAL | Status: AC
  Administered 2011-09-05 – 2011-09-06 (×2): 30 mg via ORAL
  Filled 2011-09-05 (×2): qty 1

## 2011-09-05 NOTE — Progress Notes (Signed)
Occupational Therapy Session Note  Patient Details  Name: [name] MRN: [id number] Date of Birth: [birth year]  Today's Date: 09/05/2011 Time: 4098-1191 Time Calculation (min): 30 min  Precautions: Precautions Precautions: Fall Required Braces or Orthoses: No Restrictions Weight Bearing Restrictions: No  Short Term Goals: OT Short Term Goal 1: Short Term Goals = Long Term Goals  Skilled Therapeutic Interventions/Progress Updates:    Pt. Engaged in functional mobility using straight cane in gift shop.  Pt needed minimal cues to slow down her walking and take her time.  She ambulated for about 5 minutes before needing a rest break.  Pt reported she had felt funny since her insulin shot.   Pt was able to participate the rest of the time without further complaint.     Pain:  none   Therapy/Group: Individual Therapy  Humberto Seals 09/05/2011, 3:28 PM

## 2011-09-05 NOTE — Progress Notes (Signed)
Physical Therapy Session Note  Patient Details  Name: [name] MRN: [id number] Date of Birth: 1945-12-18  Today's Date: 09/05/2011 Time: 4696-2952 Time Calculation (min): 55 minutes  Precautions: Precautions Precautions: Fall Required Braces or Orthoses: No Restrictions Weight Bearing Restrictions: No  Short Term Goals:= LTGs   Denies pain.  Other Treatments  Stair training in stairwell up/down 2 flights of stairs with minA and SPC. Daughter return demo safely with education on positioning for safety. Pt/daughter reports that there is an alternate route to go to/from house that eliminates the steep 10 steps, and instead there are shorter steps of 4 x 2-3. Recommended this may be a good alternate to start with for energy conservation, also discussed energy conservation techniques in the home and community with pt, verbalized understanding. Issued [place] Level A HEP for balance and strengthening and bridging with 15 reps each exercise for functional strengthening. Gait with SPC with supervision overall > 150' in controlled environment and 25' in home environment negotiating obstacles. Nustep for lower extremity strengthening (bilateral lowers only) on level 4 x 8 minutes with rest breaks.  Discussed with pt, daughter, and social worker that anticipate for d/c Monday afternoon - all in agreement.  Therapy/Group: Individual Therapy  [name] [facility] 09/05/2011, 2:28 PM

## 2011-09-05 NOTE — Progress Notes (Signed)
Ambulates with cane with standby assist. Remains continent of bowel and bladder. Last bowel movement 09/04/11. Small skin tear to R posterior wrist. Tegaderm intact. Bilateral lower extremity with mild edema. No c/o pain. Able to make needs known. Participating in therapy. Discharge pending. Continue with plan of care.

## 2011-09-05 NOTE — Progress Notes (Signed)
Physical Therapy Session Note  Patient Details  Name: KIRANDEEP FARISS MRN: [id number] Date of Birth: 10/12/1945  Today's Date: 09/05/2011 Time: 1000-1058 Time Calculation (min): 58 minutes  Precautions: Precautions Precautions: Fall Required Braces or Orthoses: No Restrictions Weight Bearing Restrictions: No  Short Term Goals: = LTGs  Pain Pain Assessment Pain Assessment: No/denies pain Pain Score: 0-No pain     Other Treatments  Focus of session dynamic standing balance using Wii Fit for balance activities initially steady A progressing to supervision. Cueing for safety with stepping up/down onto Wii Fit. Gait with SPC with close supervision in controlled environment x 150' x 2, no LOB noted, cueing to widen BOS with turning.   Therapy/Group: Individual Therapy  Karolee Stamps [facility] 09/05/2011, 10:49 AM

## 2011-09-05 NOTE — Progress Notes (Signed)
Occupational Therapy Session Note  Patient Details  Name: ZENAYA ULATOWSKI MRN: [id number] Date of Birth: 08/08/1946  Today's Date: 09/05/2011 Time: 0900-1000 Time Calculation (min): 60 min  Precautions: Precautions Precautions: Fall Required Braces or Orthoses: No Restrictions Weight Bearing Restrictions: No  Short Term Goals: OT Short Term Goal 1: Short Term Goals = Long Term Goals  Skilled Therapeutic Interventions/Progress Updates:    Addressed ADL retraining at shower level.  Pt needed cues for safety and decreased speed of movement.  Pt needed assist with set up which daughter provided.  .   Pain Pain Assessment Pain Assessment: No/denies pain Pain Score: 0-No pain ADL:  See fim    Exercises:  Performed Upper extremity exercises using 2 # weight and 4 # bar weight.  Addressed balance in standing and dynamic positions.  Pt. Had difficulty with lunges and needed minimal assist for balance.        Therapy/Group: Individual Therapy  Humberto Seals 09/05/2011, 10:03 AM

## 2011-09-05 NOTE — Progress Notes (Signed)
Subjective/Complaints: Review of Systems  Respiratory: Positive for cough.   All other systems reviewed and are negative.  1/11 still fatigues with therapy, but remains motivated. Feels like she's getting stronger.  Anxious to go home   Objective: Vital Signs: Blood pressure 151/74, pulse 83, temperature 98.7 F (37.1 C), temperature source Oral, resp. rate 20, height 5\' 2"  (1.575 m), weight 74.4 kg (164 lb 0.4 oz), SpO2 94.00%. No results found.  Basename 09/03/11 0637  WBC 9.1  HGB 12.8  HCT 38.1  PLT 196    Basename 09/03/11 0637  NA 137  K 3.9  CL 100  CO2 30  GLUCOSE 76  BUN 13  CREATININE 0.49*  CALCIUM 8.5   CBG (last 3)   Basename 09/05/11 0710 09/04/11 2101 09/04/11 1616  GLUCAP 77 77 224*    Wt Readings from Last 3 Encounters:  09/02/11 74.4 kg (164 lb 0.4 oz)  09/02/11 73 kg (160 lb 15 oz)  04/21/11 73.755 kg (162 lb 9.6 oz)    Physical Exam:  General appearance: alert, cooperative and no distress Head: Normocephalic, without obvious abnormality, atraumatic Eyes: conjunctivae/corneas clear. PERRL, EOM's intact. Fundi benign. Ears: normal TM's and external ear canals both ears Nose: Nares normal. Septum midline. Mucosa normal. No drainage or sinus tenderness. Throat: lips, mucosa, and tongue normal; teeth and gums normal Neck: no adenopathy, no carotid bruit, no JVD, supple, symmetrical, trachea midline and thyroid not enlarged, symmetric, no tenderness/mass/nodules Back: symmetric, no curvature. ROM normal. No CVA tenderness. Resp: rhonchi bilaterally, an occasional scattered wheeze. Breathing non labored. Good air movement Cardio: regular rate and rhythm, S1, S2 normal, no murmur, click, rub or gallop GI: soft, non-tender; bowel sounds normal; no masses,  no organomegaly Extremities: extremities normal, atraumatic, no cyanosis or edema Pulses: 2+ and symmetric Skin: Skin color, texture, turgor normal. No rashes or lesions Neurologic: Grossly  normal with intact cognition.  Still LE 4 prox to 4/5 distally Incision/Wound: none Exam updated 1/11   Assessment/Plan: 1. Functional deficits secondary to severe deconditioing, critical illness myopathy which require 3+ hours per day of interdisciplinary therapy in a comprehensive inpatient rehab setting. Physiatrist is providing close team supervision and 24 hour management of active medical problems listed below. Physiatrist and rehab team continue to assess barriers to discharge/monitor patient progress toward functional and medical goals.    Mobility: Bed Mobility Bed Mobility: Yes Supine to Sit: 5: Supervision Transfers Transfers: Yes Sit to Stand: 5: Supervision (repetitive for strengthening without UE use from 20 inch ) Sit to Stand Details (indicate cue type and reason): required min A even with BUE use with fatigue Stand to Sit: 5: Supervision;Without upper extremity assist Stand Pivot Transfers: 4: Min assist Ambulation/Gait Ambulation/Gait Assistance: 4: Min assist Ambulation Distance (Feet): 125 Feet Assistive device: Straight cane Gait Pattern: Right steppage;Left steppage Stairs: Yes Stairs Assistance: 4: Min assist Stair Management Technique: Two rails;One rail Right Number of Stairs: 5  (repeated second time with 1 rail on R) Wheelchair Mobility Distance: 200 ADL:   Cognition: Cognition Overall Cognitive Status: Appears within functional limits for tasks assessed Arousal/Alertness: Awake/alert Orientation Level: Oriented X4 Cognition Arousal/Alertness: Awake/alert Orientation Level: Oriented X4  1. Severe deconditioning/COPD exacerbation and Haemophilus pneumonia/critical illness myopathy. Continue nebulizer treatments as advised. Taper prednisone down to 20mg  over weekend.  Continue Avelox 400 mg daily as of January 8. Cough decreasing.  -nebs prn only.  -on advair and proventil at home 2. DVT Prophylaxis/Anticoagulation: Subcutaneous Lovenox .  Monitor platelet counts and any  signs of bleeding  3. Acute onset atrial fibrillation. Continue cardizem as advised per cardiology services. Monitor with increased activity. Heart rate-remains controlled.  4. Hyperglycemia. Patient no history of diabetes mellitus. Blood sugars induced secondary steroids. D/c lantus.  Decrease steroids.  Cover any spikes with SSI.   5 History of tobacco abuse. Provide counseling as appropriate      LOS (Days) 3 A FACE TO FACE EVALUATION WAS PERFORMED  SWARTZ,ZACHARY T 09/05/2011, 7:55 AM

## 2011-09-06 DIAGNOSIS — I4891 Unspecified atrial fibrillation: Secondary | ICD-10-CM

## 2011-09-06 DIAGNOSIS — Z5189 Encounter for other specified aftercare: Secondary | ICD-10-CM

## 2011-09-06 DIAGNOSIS — G7281 Critical illness myopathy: Secondary | ICD-10-CM

## 2011-09-06 DIAGNOSIS — R5381 Other malaise: Secondary | ICD-10-CM

## 2011-09-06 DIAGNOSIS — J449 Chronic obstructive pulmonary disease, unspecified: Secondary | ICD-10-CM

## 2011-09-06 DIAGNOSIS — J9600 Acute respiratory failure, unspecified whether with hypoxia or hypercapnia: Secondary | ICD-10-CM

## 2011-09-06 LAB — GLUCOSE, CAPILLARY
Glucose-Capillary: 136 mg/dL — ABNORMAL HIGH (ref 70–99)
Glucose-Capillary: 171 mg/dL — ABNORMAL HIGH (ref 70–99)

## 2011-09-06 NOTE — Progress Notes (Signed)
Patient alert and oriented x 3- calls appropriately. Patient ambulates with stand by assist with cane. Patient continent bowel and bladder with last bowel movement on 09/04/11. Patient denies any pain. Participating in therapies. Pt used grounds pass to go off floor with family. No new complaints noted. Continue with plan of care. Patient has tegaderm intact over skin tear on right wrist.

## 2011-09-06 NOTE — Progress Notes (Signed)
Patient ID: [name], female   DOB: 1945-09-15, 66 y.o.   MRN: [id number] Subjective/Complaints: Review of Systems  Respiratory: Positive for cough.   All other systems reviewed and are negative.  1/12 "Busy day tomorrow" Objective: Vital Signs: Blood pressure 141/74, pulse 87, temperature 97.9 F (36.6 C), temperature source Oral, resp. rate 20, height 5\' 2"  (1.575 m), weight 74.4 kg (164 lb 0.4 oz), SpO2 97.00%. No results found. No results found for this basename: WBC:2,HGB:2,HCT:2,PLT:2 in the last 72 hours No results found for this basename: NA:2,K:2,CL:2,CO2:2,GLUCOSE:2,BUN:2,CREATININE:2,CALCIUM:2 in the last 72 hours CBG (last 3)   Basename 09/06/11 0744 09/05/11 2113 09/05/11 1615  GLUCAP 87 175* 196*    Wt Readings from Last 3 Encounters:  09/02/11 74.4 kg (164 lb 0.4 oz)  09/02/11 73 kg (160 lb 15 oz)  04/21/11 73.755 kg (162 lb 9.6 oz)    Physical Exam:  General appearance: alert, cooperative and no distress Head: Normocephalic, without obvious abnormality, atraumatic Eyes: conjunctivae/corneas clear. PERRL, EOM's intact. Fundi benign. Ears: normal TM's and external ear canals both ears Nose: Nares normal. Septum midline. Mucosa normal. No drainage or sinus tenderness. Throat: lips, mucosa, and tongue normal; teeth and gums normal Neck: no adenopathy, no carotid bruit, no JVD, supple, symmetrical, trachea midline and thyroid not enlarged, symmetric, no tenderness/mass/nodules Back: symmetric, no curvature. ROM normal. No CVA tenderness. Resp: rhonchi bilaterally, an occasional scattered wheeze. Breathing non labored. Good air movement Cardio: regular rate and rhythm, S1, S2 normal, no murmur, click, rub or gallop GI: soft, non-tender; bowel sounds normal; no masses,  no organomegaly Extremities: extremities normal, atraumatic, no cyanosis or edema Pulses: 2+ and symmetric Skin: Skin color, texture, turgor normal. No rashes or lesions Neurologic: Grossly  normal with intact cognition.  Still LE 4 prox to 4/5 distally Oriented x 3 Incision/Wound: none Exam updated 1/12   Assessment/Plan: 1. Functional deficits secondary to severe deconditioing, critical illness myopathy which require 3+ hours per day of interdisciplinary therapy in a comprehensive inpatient rehab setting. Physiatrist is providing close team supervision and 24 hour management of active medical problems listed below. Physiatrist and rehab team continue to assess barriers to discharge/monitor patient progress toward functional and medical goals.    Mobility: Bed Mobility Bed Mobility: Yes Supine to Sit: 5: Supervision Transfers Transfers: Yes Sit to Stand: 5: Supervision (repetitive for strengthening without UE use from 20 inch ) Sit to Stand Details (indicate cue type and reason): required min A even with BUE use with fatigue Stand to Sit: 5: Supervision;Without upper extremity assist Stand Pivot Transfers: 4: Min assist Ambulation/Gait Ambulation/Gait Assistance: 4: Min assist Ambulation Distance (Feet): 125 Feet Assistive device: Straight cane Gait Pattern: Right steppage;Left steppage Stairs: Yes Stairs Assistance: 4: Min assist Stair Management Technique: Two rails;One rail Right Number of Stairs: 5  (repeated second time with 1 rail on R) Wheelchair Mobility Distance: 200 ADL:   Cognition: Cognition Overall Cognitive Status: Appears within functional limits for tasks assessed Arousal/Alertness: Awake/alert Orientation Level: Oriented X4 Cognition Arousal/Alertness: Awake/alert Orientation Level: Oriented X4  1. Severe deconditioning/COPD exacerbation and Haemophilus pneumonia/critical illness myopathy. Continue nebulizer treatments as advised. Taper prednisone down to 20mg  over weekend.  Continue Avelox 400 mg daily as of January 8. Cough decreasing.  -nebs prn only.  -on advair and proventil at home 2. DVT Prophylaxis/Anticoagulation: Subcutaneous  Lovenox . Monitor platelet counts and any signs of bleeding  3. Acute onset atrial fibrillation. Continue cardizem as advised per cardiology services. Monitor with increased activity. Heart  rate-remains controlled.  4. Hyperglycemia. Patient no history of diabetes mellitus. Blood sugars induced secondary steroids. D/c lantus.  Decrease steroids.  Cover any spikes with SSI.   5 History of tobacco abuse. Provide counseling as appropriate      LOS (Days) 4 A FACE TO FACE EVALUATION WAS PERFORMED  KIRSTEINS,ANDREW E 09/06/2011, 7:58 AM

## 2011-09-06 NOTE — Progress Notes (Signed)
Occupational Therapy Session Note  Patient Details  Name: [name] MRN: [id number] Date of Birth: October 17, 1945  Today's Date: 09/06/2011 Time: 9528-4132 Time Calculation (min): 45 min  Precautions: Precautions Precautions: Fall Required Braces or Orthoses: No Restrictions Weight Bearing Restrictions: No  Short Term Goals: OT Short Term Goal 1: Short Term Goals = Long Term Goals  Skilled Therapeutic Interventions/Progress Updates:    Addressed bathing and dressing at shower level with supervision plus cues for safety.  Pt Needed cues for set up and wc locking.  She took rest breaks as needed    Pain Pain Assessment Pain Assessment: No/denies pain Pain Score: 0-No pain ADLsee Fim     Therapy/Group: Individual Therapy  Humberto Seals 09/06/2011, 9:31 AM

## 2011-09-07 LAB — GLUCOSE, CAPILLARY: Glucose-Capillary: 121 mg/dL — ABNORMAL HIGH (ref 70–99)

## 2011-09-07 NOTE — Progress Notes (Signed)
Occupational Therapy Session Note  Patient Details  Name: [name] MRN: [id number] Date of Birth: [birth year]  Today's Date: 09/07/2011 Time: 10:15-11:00 Time Calculation (min):  Precautions: Precautions Precautions: Fall Required Braces or Orthoses: No Restrictions Weight Bearing Restrictions: No  Short Term Goals: OT Short Term Goal 1: Short Term Goals = Long Term Goals  Skilled Therapeutic Interventions/Progress Updates:    Engaged in therapeutic bathing and dressing at shower level.  Pt.gathered linens with no cues.  Pt.  Dropped several items and was able to pick up.  Pt stood to don bra and had one Loss of balance.  She showed better safety awareness by sitting down to finish.  She was able to don Ted stockings and shoes with no problems.      Pain Pain Assessment Pain Assessment: No/denies pain Pain Score: 0-No pain ADL:  See FIm  Therapy/Group: Individual Therapy  Humberto Seals 09/07/2011, 10:09 AM

## 2011-09-07 NOTE — Progress Notes (Signed)
Physical Therapy Note  Patient Details  Name: [name] MRN: [id number] Date of Birth: 30-Jun-1946 Today's Date: 09/07/2011  TIME IN/OUT 13:00-13:55 Group therapy (to challenge mobility with increased distractions and maximize effort)  Pain- none  Skilled intervention: There-ex for activity tolerance and standing balance with 2 games of wii boxing standing without UE support without LOB, multiple seated rest breaks required druing group session for 55 min; sit to stand with 1 UE support x 10 mod I Balance training- throwing and catching ball against rebounder while alternating a squat, needed min- mod cues for alternating attention task Gait training- challenged balance by walking and bouncing/catching ball with S, very fatiguing, HR 140; gait with changes in direction, sudden stops and 180 degree turns, gait with cane mod I; 10 m walk test 12.1 sec 2.7 ft/sec    Michaelene Song 09/07/2011, 2:36 PM

## 2011-09-07 NOTE — Progress Notes (Addendum)
Patient alert and oriented x 3- calls appropriately. Patient ambulates with stand by assist with cane. Patient continent bowel and bladder with last bowel movement on 09/04/11. Patient denies any pain. Participating in therapies.No new complaints noted. Continue with plan of care. Patient has tegaderm intact over skin tear on right wrist.       Patient given incentive spirometer this evening- patient can go up to . Patient verbalizes understanding of incentive spirometer usage.

## 2011-09-07 NOTE — Progress Notes (Signed)
Patient ID: [name], female   DOB: 12-13-1945, 66 y.o.   MRN: [id number] Subjective/Complaints: Review of Systems  Respiratory: Positive for cough.   All other systems reviewed and are negative.  Had many visitors yesterday Objective: Vital Signs: Blood pressure 142/77, pulse 86, temperature 98.3 F (36.8 C), temperature source Oral, resp. rate 16, height 5\' 2"  (1.575 m), weight 74.4 kg (164 lb 0.4 oz), SpO2 95.00%. No results found. No results found for this basename: WBC:2,HGB:2,HCT:2,PLT:2 in the last 72 hours No results found for this basename: NA:2,K:2,CL:2,CO2:2,GLUCOSE:2,BUN:2,CREATININE:2,CALCIUM:2 in the last 72 hours CBG (last 3)   Basename 09/06/11 2053 09/06/11 1617 09/06/11 1131  GLUCAP 171* 167* 136*    Wt Readings from Last 3 Encounters:  09/02/11 74.4 kg (164 lb 0.4 oz)  09/02/11 73 kg (160 lb 15 oz)  04/21/11 73.755 kg (162 lb 9.6 oz)    Physical Exam:  General appearance: alert, cooperative and no distress Head: Normocephalic, without obvious abnormality, atraumatic Eyes: EOMI Ears: ext nl Nose: Nares normal. Septum midline. Mucosa normal. No drainage or sinus tenderness. Throat: lips, mucosa, and tongue normal; teeth and gums normal Neck: no adenopathy, no carotid bruit, no JVD, supple, symmetrical, trachea midline and thyroid not enlarged, symmetric, no tenderness/mass/nodules Back: symmetric, no curvature. ROM normal. No CVA tenderness. Resp: rhonchi bilaterally, an occasional scattered wheeze. Breathing non labored. Good air movement Cardio: regular rate and rhythm, S1, S2 normal, no murmur, click, rub or gallop GI: soft, non-tender; bowel sounds normal; no masses,  no organomegaly Extremities: extremities normal, atraumatic, no cyanosis or edema Pulses: 2+ and symmetric Skin: Skin color, texture, turgor normal. No rashes or lesions Neurologic: Grossly normal with intact cognition.  Still LE 4 prox to 4/5 distally Oriented x 3 Incision/Wound:  none Exam updated 1/13   Assessment/Plan: 1. Functional deficits secondary to severe deconditioing, critical illness myopathy which require 3+ hours per day of interdisciplinary therapy in a comprehensive inpatient rehab setting. Physiatrist is providing close team supervision and 24 hour management of active medical problems listed below. Physiatrist and rehab team continue to assess barriers to discharge/monitor patient progress toward functional and medical goals.    Mobility: Bed Mobility Bed Mobility: Yes Supine to Sit: 5: Supervision Transfers Transfers: Yes Sit to Stand: 5: Supervision (repetitive for strengthening without UE use from 20 inch ) Sit to Stand Details (indicate cue type and reason): required min A even with BUE use with fatigue Stand to Sit: 5: Supervision;Without upper extremity assist Stand Pivot Transfers: 4: Min assist Ambulation/Gait Ambulation/Gait Assistance: 4: Min assist Ambulation Distance (Feet): 125 Feet Assistive device: Straight cane Gait Pattern: Right steppage;Left steppage Stairs: Yes Stairs Assistance: 4: Min assist Stair Management Technique: Two rails;One rail Right Number of Stairs: 5  (repeated second time with 1 rail on R) Wheelchair Mobility Distance: 200 ADL:   Cognition: Cognition Overall Cognitive Status: Appears within functional limits for tasks assessed Arousal/Alertness: Awake/alert Orientation Level: Oriented X4 Cognition Arousal/Alertness: Awake/alert Orientation Level: Oriented X4  1. Severe deconditioning/COPD exacerbation and Haemophilus pneumonia/critical illness myopathy. Continue nebulizer treatments as advised. Taper prednisone down to 20mg  over weekend.  Continue Avelox 400 mg daily as of January 8. Cough decreasing.  -nebs prn only.  -on advair and proventil at home 2. DVT Prophylaxis/Anticoagulation: Subcutaneous Lovenox . Monitor platelet counts and any signs of bleeding  3. Acute onset atrial  fibrillation. Continue cardizem as advised per cardiology services. Monitor with increased activity. Heart rate-remains controlled.  4. Hyperglycemia. Patient no history of diabetes mellitus. Blood  sugars induced secondary steroids. D/c lantus.  Decrease steroids.  Cover any spikes with SSI.   5 History of tobacco abuse. Provide counseling as appropriate  Pt desires full code    LOS (Days) 5 A FACE TO FACE EVALUATION WAS PERFORMED  [name] 09/07/2011, 7:39 AM

## 2011-09-07 NOTE — Progress Notes (Signed)
Occupational Therapy Note  Patient Details  Name: [name] MRN: [id number] Date of Birth: 1946-06-07 Today's Date: 09/07/2011 Time:  1430-1500;  ( ) Individual Session Pain:  None  Pt. Upset about the argument she had with her daughter and teared up.   Listened to pt and allow her to voice her frustrations.    Addressed functional mobility around unit.  Used biodex  (limits of stability) for balance with physical cues to weight shift.  She  Used hands about 50% of time as support.     Humberto Seals 09/07/2011, 3:56 PM

## 2011-09-07 NOTE — Progress Notes (Addendum)
Physical Therapy Session Note  Patient Details  Name: [name] MRN: [id number] Date of Birth: Mar 01, 1946  Today's Date: 09/07/2011 Time:  - 0800-0855   Precautions: Precautions Precautions: Fall Required Braces or Orthoses: No Restrictions Weight Bearing Restrictions: No  Skilled Therapeutic Interventions/Progress Updates:    therapeutic activity- for balance retraining and memory to decrease fall risk, peformed floor transfer for fall recovery,  There-ex- for strengthening LE's and improving muscle endurance also for fall prevention; pt still moves quickly and requires cues to slow down for safety, discussed use of cane in open environments and community, pt in agreement Decreased activity tolerance- seated rest breaks required thruout session. Daughter present for session.  General Chart Reviewed: Yes Vital Signs 128 with activity Pain Pain Assessment Pain Assessment: No/denies pain Mobility Transfers Sit to Stand: 7: Independent Stand to Sit: 7: Independent Stand to Sit Details: floor transfer for fall recovery without A Stand Pivot Transfers: 7: Independent Locomotion  Ambulation Ambulation: Yes Ambulation/Gait Assistance: 5: Supervision Ambulation Distance (Feet): 200 Feet Ambulation/Gait Assistance Details (indicate cue type and reason): some staggering, self corrected, pt reports she was more unsteady than she had been in room with ADL earlier Stairs / Additional Locomotion Stairs: Yes Stairs Assistance: 5: Supervision Stairs Assistance Details (indicate cue type and reason): cues to slow down for safety 9stairs in hall), practiced stairs in gym reciprocally for hip stenght and balance training, pt fearful, close S Stair Management Technique: One rail Left;One rail Right;With cane;Step to pattern Number of Stairs: 13       Balance Balance Balance Assessed: Yes Dynamic Sitting Balance Dynamic Sitting - Level of Assistance: 7: Independent Static Standing  Balance Static Standing - Level of Assistance: 6: Modified independent (Device/Increase time) Dynamic Standing Balance Dynamic Standing - Comments: single leg stance with min A while playing memory game with sequence of 4 min A for balance with cognitive task, stepping in all planes, pick object off floor without A   Exercises General Exercises - Lower Extremity Long Arc Quad: AROM;10 reps;Weights;Seated Hip ABduction/ADduction: AROM;Strengthening;10 reps;Standing Mini-Sqauts: AROM;Strengthening;10 reps;Standing Standing Knee Flexion: AROM;Strengthening;Both;10 reps;Weights Repetitive Sit to Stands: One upper extremity;8 reps Other Exercises Other Exercises: tandem stance 30 sec witout UE support  Therapy/Group: Individual Therapy  Michaelene Song 09/07/2011, 8:46 AM

## 2011-09-08 LAB — GLUCOSE, CAPILLARY: Glucose-Capillary: 93 mg/dL (ref 70–99)

## 2011-09-08 MED ORDER — DILTIAZEM HCL 90 MG PO TABS: 90.0000 mg | ORAL_TABLET | Freq: Four times a day (QID) | ORAL | Status: DC

## 2011-09-08 MED ORDER — FAMOTIDINE 20 MG PO TABS: 20.0000 mg | ORAL_TABLET | Freq: Two times a day (BID) | ORAL | Status: DC

## 2011-09-08 MED ORDER — PREDNISONE 10 MG PO TABS: ORAL_TABLET | ORAL | Status: DC

## 2011-09-08 NOTE — Progress Notes (Signed)
Physical Therapy Discharge Summary  Patient Details  Name: [name] MRN: [id number] Date of Birth: Oct 19, 1945 Today's Date: 09/08/2011  Patient has met 7 of 7 long term goals due to improved activity tolerance and improved balance.  Patient to discharge at an ambulatory level Modified Independent using a single point cane.  Patient's daughter is independent to provide the necessary supervision assistance at discharge if needed though pt is overall modified independent with basic functional mobility.   Grossly pt's strength is 4+/5 in lower extremities. [place] HEP given for strengthening and balance training for fall prevention in the home.   Recommendation:  Patient will benefit from ongoing skilled PT services in outpatient setting to continue to advance safe functional mobility, address ongoing impairments in gait, balance, strength, activity tolerance, and minimize fall risk.  Equipment: Equipment provided: single point cane  Patient/family agrees with progress made and goals achieved: Yes  Tedd Sias 09/08/2011, 11:33 AM

## 2011-09-08 NOTE — Progress Notes (Signed)
Pt discharged home with daughter. Discharge instructions discussed with pt by Harvel Ricks, PA. Pt escorted off unit in w/c with personal belonging by Herbert Seta, NT.

## 2011-09-08 NOTE — Progress Notes (Signed)
Patient ID: [name], female   DOB: 10-02-1945, 66 y.o.   MRN: [id number] Patient ID: [name], female   DOB: 1946/02/15, 66 y.o.   MRN: [id number] Subjective/Complaints: Review of Systems  Respiratory: Positive for cough.   All other systems reviewed and are negative.  Excited to go home today 1/14 Objective: Vital Signs: Blood pressure 138/80, pulse 107, temperature 98.8 F (37.1 C), temperature source Oral, resp. rate 20, height 5\' 2"  (1.575 m), weight 74.4 kg (164 lb 0.4 oz), SpO2 96.00%. No results found. No results found for this basename: WBC:2,HGB:2,HCT:2,PLT:2 in the last 72 hours No results found for this basename: NA:2,K:2,CL:2,CO2:2,GLUCOSE:2,BUN:2,CREATININE:2,CALCIUM:2 in the last 72 hours CBG (last 3)   Basename 09/08/11 0737 09/07/11 2038 09/07/11 1620  GLUCAP 93 121* 146*    Wt Readings from Last 3 Encounters:  09/02/11 74.4 kg (164 lb 0.4 oz)  09/02/11 73 kg (160 lb 15 oz)  04/21/11 73.755 kg (162 lb 9.6 oz)    Physical Exam:  General appearance: alert, cooperative and no distress Head: Normocephalic, without obvious abnormality, atraumatic Eyes: EOMI Ears: ext nl Nose: Nares normal. Septum midline. Mucosa normal. No drainage or sinus tenderness. Throat: lips, mucosa, and tongue normal; teeth and gums normal Neck: no adenopathy, no carotid bruit, no JVD, supple, symmetrical, trachea midline and thyroid not enlarged, symmetric, no tenderness/mass/nodules Back: symmetric, no curvature. ROM normal. No CVA tenderness. Resp: rhonchi bilaterally,  occasional scattered wheezes and rhonchi. Breathing non labored. Good air movement still Cardio: regular rate and rhythm, S1, S2 normal, no murmur, click, rub or gallop GI: soft, non-tender; bowel sounds normal; no masses,  no organomegaly Extremities: extremities normal, atraumatic, no cyanosis or edema Pulses: 2+ and symmetric Skin: Skin color, texture, turgor normal. No rashes or lesions Neurologic:  Grossly normal with intact cognition.  LES 4+/5. Oriented x 3 Incision/Wound: none Exam updated 1/14   Assessment/Plan: 1. Functional deficits secondary to severe deconditioing, critical illness myopathy which require 3+ hours per day of interdisciplinary therapy in a comprehensive inpatient rehab setting. Physiatrist is providing close team supervision and 24 hour management of active medical problems listed below. Physiatrist and rehab team continue to assess barriers to discharge/monitor patient progress toward functional and medical goals.    Mobility: Bed Mobility Bed Mobility: Yes Supine to Sit: 5: Supervision Transfers Transfers: Yes Sit to Stand: 7: Independent Sit to Stand Details (indicate cue type and reason): required min A even with BUE use with fatigue Stand to Sit: 7: Independent Stand to Sit Details: floor transfer for fall recovery without A Stand Pivot Transfers: 7: Independent Ambulation/Gait Ambulation/Gait Assistance: 6: Modified independent (Device/Increase time) Ambulation/Gait Assistance Details (indicate cue type and reason): some staggering, self corrected, pt reports she was more unsteady than she had been in room with ADL earlier Ambulation Distance (Feet): 200 Feet Assistive device: Straight cane Gait Pattern: Right steppage;Left steppage Stairs: Yes Stairs Assistance: 5: Supervision Stairs Assistance Details (indicate cue type and reason): cues to slow down for safety 9stairs in hall), practiced stairs in gym reciprocally for hip stenght and balance training, pt fearful, close S Stair Management Technique: One rail Left;One rail Right;With cane;Step to pattern Number of Stairs: 13  Wheelchair Mobility Distance: 200 ADL:   Cognition: Cognition Overall Cognitive Status: Appears within functional limits for tasks assessed Arousal/Alertness: Awake/alert Orientation Level: Oriented X4 Cognition Arousal/Alertness: Awake/alert Orientation Level:  Oriented X4  1. Severe deconditioning/COPD exacerbation and Haemophilus pneumonia/critical illness myopathy. Continue nebulizer treatments as advised. Taper prednisone down to 20mg   over weekend.   -can d/c avelox.  -nebs prn only.  -on advair and proventil at home. Weaning slowly off prednisone.  -recommend f/u with Dr. Kriste Basque within the next week or so 2. DVT Prophylaxis/Anticoagulation: Subcutaneous Lovenox . Monitor platelet counts and any signs of bleeding  3. Acute onset atrial fibrillation. Continue cardizem as advised per cardiology services. Monitor with increased activity. Heart rate-remains controlled.  4. Hyperglycemia. Patient no history of diabetes mellitus. Blood sugars induced secondary steroids. D/c'dlantus.  Decreasing steroids. 5 History of tobacco abuse. Provide counseling as appropriate  Pt desires full code    LOS (Days) 6 A FACE TO FACE EVALUATION WAS PERFORMED  [name] T 09/08/2011, 7:54 AM

## 2011-09-08 NOTE — Discharge Summary (Signed)
[name], [name]             ACCOUNT NO.:  [id number]  MEDICAL RECORD NO.:  [id number]  LOCATION:  4010                         FACILITY:  [facility]  PHYSICIAN:  Ranelle Oyster, M.D.DATE OF BIRTH:  Aug 01, 1946  DATE OF ADMISSION:  09/02/2011 DATE OF DISCHARGE:  09/08/2011                              DISCHARGE SUMMARY   DISCHARGE DIAGNOSES: 1. Severe deconditioning with chronic obstructive pulmonary disease     exacerbation - critical illness myopathy. 2. Subcutaneous Lovenox for deep vein thrombosis prophylaxis. 3. Atrial fibrillation. 4. History of tobacco abuse.  HISTORY OF PRESENT ILLNESS:  This is a 66 year old right-handed white female with history of moderate persistent asthma as well as history of tobacco abuse.  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  She was admitted on December 29, with worsening shortness of breath, cough, sputum production for 2 days as well as fever of 102.  She received albuterol treatment, found to have atrial fibrillation with rapid ventricular rate with heart rate in the 200. She was given adenosine x2, placed on Cardizem drip per Cardiology Services.  Chest x-ray showed worsening perihilar and infrahilar bronchitic changes versus pneumonitis.  She did require intubation, later extubated January 3.  Placed on broad-spectrum antibiotics. Followup Pulmonary Services for chronic obstructive pulmonary disease exacerbation with acute respiratory failure due to flu and Haemophilus pneumonia.  Subcutaneous Lovenox was added for deep vein thrombosis prophylaxis.  Venous Dopplers of lower extremities on December 30 were negative.  She remained on prednisone therapy and tapered as advised. Cardiac rate controlled with Cardizem.  Echocardiogram with ejection fraction 65% without emboli, and normal systolic function.  She had remained on Rocephin intravenously, changed to Avelox on September 01, 2011.  Blood sugars  with some elevations felt to be related to steroids that were adjusted accordingly.  Physical therapy ongoing, ambulating 10 feet with total assist, then she was admitted for comprehensive rehab program.  PAST MEDICAL HISTORY:  See discharge diagnoses.  SOCIAL HISTORY:  Lives alone.  She has a daughter from out of town to assist as needed, as well as a boyfriend in the area.  One level home, multiple steps to entry.  FUNCTIONAL HISTORY:  Prior to admission was independent.  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  Functional status upon admission to Rehab Services was minimal assist for supine to sit, +2 total assist, ambulate 10 feet with a rolling walker, limited endurance.  PHYSICAL EXAMINATION:  VITAL SIGNS:  Blood pressure 132/74, pulse 107, respirations 20, temperature 98.8. GENERAL:  This was an alert female, in no acute distress, oriented x3. Well developed. HEENT:  Normocephalic. LUNGS:  Decreased breath sounds.  Clear to auscultation. CARDIAC:  Rate controlled. ABDOMEN:  Soft, nontender.  Good bowel sounds. EXTREMITIES:  Upper extremities, 3+ to 4/5 proximal, 4/5 distally. Lower extremities, 2+/5 proximal, 3+/5 distally. SKIN:  Intact.  REHABILITATION AND HOSPITAL COURSE:  The patient was admitted to inpatient Rehab Services with therapies initiated on a 3-hour daily basis consisting of physical therapy, occupational therapy, and rehabilitation nursing.  The following issues were addressed during the patient's rehabilitation stay.  Pertaining to [name]'s severe deconditioning, chronic obstructive pulmonary  disease exacerbation with critical illness myopathy, she continued to improve dramatically.  She continued on nebulizer treatments.  Her prednisone was tapered as advised.  She was completing her course of Avelox.  She remained afebrile.  She was maintained on subcutaneous Lovenox for deep vein thrombosis prophylaxis throughout her hospital course.   Her heart rate was well controlled with Cardizem for new onset atrial fibrillation. Echocardiogram had been done early in hospital course is unremarkable. She would follow up with Cardiology Services.  She was weaned from her Lantus insulin as her steroids were tapered.  She had no history of diabetes mellitus.  Noted history of tobacco abuse.  She received full counseling in regards to cessation of any nicotine products.  It was questionable if she would be compliant with these requests.  The patient did receive weekly collaborative interdisciplinary team conferences to discuss estimated length of stay, family teaching, and any barriers to discharge.  She was supervision for her activities of daily living. Strength and endurance had greatly improved.  She was ambulating, extended household distances with an assistive device.  She exhibited no unsafe behavior.  She had full recognition of her rehab program.  She was advised no driving.  Ongoing therapies would be dictated as per [employer].  DISCHARGE MEDICATIONS: 1. Proventil inhaler 2 puffs every 6 hours as needed. 2. Xanax 0.5 mg 3 times daily as needed. 3. Cardizem 90 mg every 6 hours. 4. Pepcid 20 mg twice daily. 5. Advair Diskus 250/50 one puff twice daily. 6. Hydrochlorothiazide 12.5 mg daily. 7. Prednisone 20 mg daily adjusted and tapered as advised. 8. She will complete her Avelox therapy 400 mg daily as directed.  DIET:  Regular.  SPECIAL INSTRUCTIONS:  Continue therapies as advised per [employer]. Follow up Dr. Faith Rogue, outpatient Rehab Service office as needed. Follow up Critical Care Medicine for Pulmonary Services, call for appointment.  Cardiology Services, Dr. Dietrich Pates.     [name], P.A.   ______________________________ Ranelle Oyster, M.D.    DA/MEDQ  D:  09/08/2011  T:  09/08/2011  Job:  [phone]

## 2011-09-08 NOTE — Progress Notes (Signed)
Social Work  Discharge Note  The overall goal for the admission was met for:   Discharge location: Yes - home with daughter to assist  Length of Stay: Yes - 6 days  Discharge activity level: Yes- modified indepent  Home/community participation: Yes  Services provided included: MD, RD, PT, OT, RN, CM, TR, Pharmacy and SW  Financial Services: Private Insurance: [facility]  Follow-up services arranged: Outpatient: PT via [facility]  Comments (or additional information):  Patient/Family verbalized understanding of follow-up arrangements: Yes  Individual responsible for coordination of the follow-up plan: patient  Confirmed correct DME delivered:  NA   Sim Choquette

## 2011-09-08 NOTE — Progress Notes (Signed)
Occupational Therapy Discharge Summary & Session Note  Patient Details  Name: [name] MRN: [id number] Date of Birth: 12/06/1945 Today's Date: 09/08/2011  Session Note  [id number] - 25 Minutes Individual Therapy No complaints of pain  Engaged in ADL retraining at shower level. Patient at an overall modified independent level for aspects of daily living. Daughter present during session with verbalization of understanding for ADLs and overall safety with ADLs, ADL transfers, and functional ambulation with and without cane. Patient also with verbalization and return demonstration of understanding for ADLs and overall safety with ADLs, ADL transfers, and functional ambulation with and without cane. Daughter plans to live with patient for awhile. Patient is at an overall supervision level for IADLs and homemaking tasks at this time.   Discharge Summary  Patient has met 8 of 9 long term goals due to improved activity tolerance, improved balance, improved awareness and improved coordination.  Patient did not meet homemaking goal secondary to patient still requires supervision and some set-up assistance during some homemaking tasks. Patient's care partner is independent to provide the necessary supervision prn at discharge.    See FIM and discharge navigator for objective and subjective measures.   Recommendation:  No additional OT recommended at this time  Equipment: No equipment provided; per patient and family report they have shower seat at home. No recommendation for a BSC at this time.  Patient/family agrees with progress made and goals achieved: Yes  Terance Pomplun,[name] 09/08/2011, 10:12 AM

## 2011-09-09 ENCOUNTER — Ambulatory Visit: Payer: 59 | Attending: Physical Medicine & Rehabilitation | Admitting: Physical Therapy

## 2011-09-09 DIAGNOSIS — R269 Unspecified abnormalities of gait and mobility: Secondary | ICD-10-CM | POA: Insufficient documentation

## 2011-09-09 DIAGNOSIS — M6281 Muscle weakness (generalized): Secondary | ICD-10-CM | POA: Insufficient documentation

## 2011-09-09 DIAGNOSIS — IMO0001 Reserved for inherently not codable concepts without codable children: Secondary | ICD-10-CM | POA: Insufficient documentation

## 2011-09-15 ENCOUNTER — Ambulatory Visit (INDEPENDENT_AMBULATORY_CARE_PROVIDER_SITE_OTHER)
Admission: RE | Admit: 2011-09-15 | Discharge: 2011-09-15 | Disposition: A | Discharge status: Home / Self Care | Payer: 59 | Source: Ambulatory Visit | Attending: Pulmonary Disease | Admitting: Pulmonary Disease

## 2011-09-15 ENCOUNTER — Encounter: Payer: Self-pay | Admitting: Pulmonary Disease

## 2011-09-15 ENCOUNTER — Ambulatory Visit: Payer: 59 | Admitting: Physical Therapy

## 2011-09-15 ENCOUNTER — Ambulatory Visit (INDEPENDENT_AMBULATORY_CARE_PROVIDER_SITE_OTHER): Payer: 59 | Admitting: Pulmonary Disease

## 2011-09-15 ENCOUNTER — Other Ambulatory Visit (INDEPENDENT_AMBULATORY_CARE_PROVIDER_SITE_OTHER): Payer: 59

## 2011-09-15 ENCOUNTER — Inpatient Hospital Stay: Payer: 59 | Admitting: Adult Health

## 2011-09-15 DIAGNOSIS — M545 Low back pain, unspecified: Secondary | ICD-10-CM

## 2011-09-15 DIAGNOSIS — G7281 Critical illness myopathy: Secondary | ICD-10-CM | POA: Insufficient documentation

## 2011-09-15 DIAGNOSIS — J45909 Unspecified asthma, uncomplicated: Secondary | ICD-10-CM

## 2011-09-15 DIAGNOSIS — R5381 Other malaise: Secondary | ICD-10-CM

## 2011-09-15 DIAGNOSIS — Z8709 Personal history of other diseases of the respiratory system: Secondary | ICD-10-CM | POA: Insufficient documentation

## 2011-09-15 DIAGNOSIS — E7800 Pure hypercholesterolemia, unspecified: Secondary | ICD-10-CM

## 2011-09-15 DIAGNOSIS — M6250 Muscle wasting and atrophy, not elsewhere classified, unspecified site: Secondary | ICD-10-CM

## 2011-09-15 DIAGNOSIS — I10 Essential (primary) hypertension: Secondary | ICD-10-CM

## 2011-09-15 DIAGNOSIS — Z8679 Personal history of other diseases of the circulatory system: Secondary | ICD-10-CM

## 2011-09-15 LAB — BASIC METABOLIC PANEL
Calcium: 9.1 mg/dL (ref 8.4–10.5)
Creatinine, Ser: 0.7 mg/dL (ref 0.4–1.2)
Sodium: 137 mEq/L (ref 135–145)

## 2011-09-15 LAB — CBC WITH DIFFERENTIAL/PLATELET
Basophils Relative: 1.2 % (ref 0.0–3.0)
Eosinophils Absolute: 0.2 10*3/uL (ref 0.0–0.7)
Eosinophils Relative: 2.6 % (ref 0.0–5.0)
Lymphocytes Relative: 16.5 % (ref 12.0–46.0)
Monocytes Relative: 7.8 % (ref 3.0–12.0)
Neutrophils Relative %: 71.9 % (ref 43.0–77.0)
RBC: 4.65 Mil/uL (ref 3.87–5.11)
WBC: 7.2 10*3/uL (ref 4.5–10.5)

## 2011-09-15 MED ORDER — DILTIAZEM HCL ER COATED BEADS 120 MG PO CP24: 120.0000 mg | ORAL_CAPSULE | Freq: Two times a day (BID) | ORAL | Status: DC

## 2011-09-15 MED ORDER — TIOTROPIUM BROMIDE MONOHYDRATE 18 MCG IN CAPS: 18.0000 ug | ORAL_CAPSULE | Freq: Every day | RESPIRATORY_TRACT | Status: DC

## 2011-09-15 MED ORDER — PREDNISONE 10 MG PO TABS: ORAL_TABLET | ORAL | Status: DC

## 2011-09-15 NOTE — Progress Notes (Signed)
Subjective:    Patient ID: [name], female    DOB: [birth year], 66 y.o.   MRN: [id number]  HPI 66 y/o WF here for a follow up visit & review of her mult med problems...  ~  September 25, 2008:  when last seen Aug09 her FLP was even worse w/ TChol 252, & LDL 180... she agreed to start Simva40 and has been regular w/ her dosing & tol well... here today for f/u FLP... she also has Asthma & has been good about taking her Advair250Bid although she still uses her Proair fairly frequently (several times per week)... NOTE: SHE TELLS ME THAT SHE IS WILLING TO SCHEDULE A COLONOSCOPY!!!  ~  Dec 26, 2009:  she states she had a good yr & feels great... no problems w/ her Asthma at all, she says... she had her colonoscopy by DrDBrodie 2/10 & it was neg- f/u planned 50yrs... she has not been taking her Simva40 ("I forget it half the time") & she's been "doing it on my own w/ really good diet" despite 4# weight gain... she agrees to try Crestor in the AM w/ her other meds if she needs statin therapy... she notes that BP has been great at work- all 130-140/ 70-80 she says;  but todays BP is elevated w/ sl HA & she is quite sure this is a one time aberration since ALL her prev readings were so normal at work... we discussed no salt, get weight down, take med every day, & check BP's often at home, at work, etc (she will call if BP> 150/90).  ~  April 21, 2011:  15 month ROV & refill of meds> Advair, Proventil, Hyzaar...    >She is c/o URI, cough, congestion, thick beige x3d now; sl SOB but denies CP, palpit, f/c/s, etc;  Ex-smoker, quit 2002 w/ hx Asthma & AB in past> she had a good yr taking the Advair & using the Proventil as rescue inhaler;  Exam shows scat rhonchi & we discussed RX w/ ZPak, Pred, Mucinex, Fluids, etc...    >States BP well controlled on Losartan/HCT w/ BP= 138/84 today & asymptomatic...    >Hx Hyperchol & when last seen she was to start Crestor but she decided against it & prefers diet alone  even though her BEST LDL over the last few yrs= 150;  She is not fasting today, she declines to ret for FLP, she declines L:ipid Clinic referral...    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  ~  September 15, 2011:  35mo ROV & Post Hosp check>>  NOTE: extensive hosp records reviewed, MED reconciliation betw prev meds- hosp meds- rehab disch meds- completed; 50% of visit spent on counseling & review...    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 Interesting that she does not remember anything from the time she called 911, until she awoke in the hosp a day or so after the ETT was removed...    >We reconciled all her meds from our prev Outpt regimen to the [facility] meds & finally the disch meds 1/14 when sent home from rehab: SEE BELOW>> Today she has bilat rhonchi & exp wheezing on exam- O2sat 94% on RA rest;  CXR today shows hyperinflation & sl incr interstitial lung markings, NAD (markings are reduced from recent hosp films)... PFT shows FVC=1.82 (65%), FEV1=1.00 (46%), %1sec=55, mid-flows=17%predicted >> c/w mod severe obstructive defect & ?superimposed restriction as well... LABS reveal  CBC-  wnl, Chems- ok x K=3.1, BS=109 & A1c=6.4.Marland KitchenMarland Kitchen REC> PREDNISONE 10mg /d==> slow taper to 5mg Qod by ret visit;  ADVAIR 250Bid;  SPIRIVA daily: Proventil HFA rescue prn; MUCINEX 2Bid w/ fluids; CARDIZEM CD 120mg  Bid; PEPCID 20mg  Bid; & KCL cap Bid...          Problem List:  Hx of ASTHMA, MODERATE >> COPD w/ Hx Acute Resp Failure 1/13 >> now on ADVAIR 250Bid, SPIRIVA daily, MUCINEX 2Bid, Fluids, PROAIR rescue... She is an ex-smoker, quit 2002> hx mod asthma w/ occas exac & mult triggers, ? of VCD in the past; she had been taking ADVAIR 250Bid and PROAIR rescue- reports no asthma attacks in several yrs; she had trip to [place] and went to [place] in the summer of 2009 without too much difficulty... ~  5/11: states good yr, no problems from her Asthma- denies cough, sputum, hemoptysis, worsening dyspnea, wheezing, chest pains, snoring, daytime hypersomnolence, etc... CXR 5/11 showed chr changes/ COPD, NAD.Marland Kitchen. ~  8/12: mild URI, COPD exac> treated w/ ZPak, Pred, Mucinex, etc... 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 ~  1/13 office f/u> CXR shows hyperinflation & sl incr interstitial lung markings, NAD (markings are reduced from recent hosp films)... PFT shows FVC=1.82 (65%), FEV1=1.00 (46%), %1sec=55, mid-flows=17%predicted >> c/w mod severe obstructive defect & ?superimposed restriction as well...  HYPERTENSION (ICD-401.9) - hx mod HBP that was well controlled on LosartanHCT 100-12.5 daily- up until meds changed 1/13 Hosp... ~  EKG 5/11 showed NSR, WNL.Marland Kitchen. ~  5/11:  we discussed the importance of no salt, get weight down, take med every day, & check BP's often at home, at work, etc (she will call if BP> 150/90). ~  8/12: BP 138/84, she has sl HA & fatigue, but denies visual changes, CP, palipit, dizziness, syncope, dyspnea, edema, etc... ~  1/13:  BP= 112/72 post hosp f/u visit on  Cardizem90Q6H & HCT12.5/d; REC> stop HCT, & change CardizemCD 120mg Bid.  HYPERCHOLESTEROLEMIA (ICD-272.0) - she tried  to control Chol w/ oatmeal, cheerios, and fish oil... finally agreed to start SIMVASTATIN 40mg /d in Aug09, but couldn't remember to take it half the time since it was at night... she has agreed to try CRESTOR Qam>  ~  FLP 4/08 showed TChol 241, TG 126, HDL 61, LDL 155... rec to start Simvastatin, but chose diet Rx. ~  FLP 8/09 showed TChol 252, TG 96, HDL 57, LDL 180... she agreed to start Simva40. ~  FLP 1/10 showed TChol 213, TG 72, HDL 50, LDL 150... rec to take med more regularly. ~  FLP 5/11 showed TChol 234, TG 74, HDL 68, LDL 156... admits to not taking Simva40Qpm, ch to CRESTOR10Qam. ~  She never took the Crestor & declines med rx, lipid clinic, or other interventions; she is content to manage this prob on diet alone.  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 eval from DrGioffre and Alusio in the past...  Hx CRITICAL ILLNESS MYOPATHY >> occurred after her 1/13 admission for Asthma/COPD exac w/ resp failure on vent x5d==> to rehab & eventually disch w/ outpt rehab on-going...  S/P left thumb CMC joint arthroplasty by DrGramig 4/12...  ANXIETY (ICD-300.00)   Past Surgical History  Procedure Date  . Tonsillectomy and adnoidectomy   . Cholecystectomy   . Left thumb cmc arthroplasty 11/2010    by DrGramig    Outpatient Encounter Prescriptions as of 09/15/2011  Medication Sig Dispense Refill  . albuterol (PROVENTIL HFA;VENTOLIN HFA) 108 (90 BASE) MCG/ACT inhaler Inhale 2 puffs into the lungs every 6 (six) hours as needed. For shortness of breath       . famotidine (PEPCID) 20 MG tablet Take 1 tablet (20 mg total) by mouth 2 (two) times daily.  60 tablet  0  . Fluticasone-Salmeterol (ADVAIR) 250-50 MCG/DOSE AEPB Inhale 1 puff into the lungs 2 (two) times daily.        Marland Kitchen diltiazem (CARDIZEM CD) 120 MG 24 hr capsule Take 1 capsule (120 mg total) by mouth 2  (two) times daily.  60 capsule  5  . predniSONE (DELTASONE) 10 MG tablet Take as directed  50 tablet  0  . tiotropium (SPIRIVA HANDIHALER) 18 MCG inhalation capsule Place 1 capsule (18 mcg total) into inhaler and inhale daily.  30 capsule  6  . DISCONTD: diltiazem (CARDIZEM) 90 MG tablet Take 1 tablet (90 mg total) by mouth every 6 (six) hours.  360 tablet  1  . DISCONTD: losartan-hydrochlorothiazide (HYZAAR) 100-12.5 MG per tablet Take 1 tablet by mouth daily.        Marland Kitchen DISCONTD: predniSONE (DELTASONE) 10 MG tablet Two tab daily for three days then one tab daily for three days then 1/2 tab daily for three days and stop  11 tablet  0    Allergies  Allergen Reactions  . Codeine     REACTION: nausea    Current Medications, Allergies, Past Medical History, Past Surgical History, Family History, and Social History were reviewed in [employer] record.    Review of Systems        The patient denies fever, chills, sweats, anorexia, fatigue, weakness, malaise, weight loss, sleep disorder, blurring, diplopia, eye irritation, eye discharge, vision loss, eye pain, photophobia, earache, ear discharge, tinnitus, decreased hearing, nasal congestion, nosebleeds, sore throat, hoarseness, chest pain, palpitations, syncope, dyspnea on exertion, orthopnea, PND, peripheral edema, cough, dyspnea at rest, excessive sputum, hemoptysis, wheezing, pleurisy, nausea, vomiting, diarrhea, constipation, change in bowel habits, abdominal pain, melena, hematochezia, jaundice, gas/bloating, indigestion/heartburn, dysphagia, odynophagia, dysuria, hematuria, urinary frequency, urinary hesitancy, nocturia, incontinence, back pain, joint pain, joint swelling, muscle cramps, muscle weakness, stiffness, arthritis, sciatica, restless legs, leg pain at night, leg pain with exertion, rash, itching, dryness, suspicious lesions, paralysis, paresthesias, seizures, tremors, vertigo, transient blindness, frequent falls,  frequent headaches, difficulty walking, depression, anxiety, memory loss, confusion, cold intolerance, heat intolerance, polydipsia, polyphagia, polyuria, unusual weight change, abnormal bruising, bleeding, enlarged lymph nodes, urticaria, allergic rash, hay fever, and recurrent infections.     Objective:   Physical Exam    WD, WN,  66 y/o WF in NAD... GENERAL:  Alert & oriented; pleasant & cooperative... HEENT:  [place]/AT, EOM-wnl, PERRLA, EACs-clear, TMs-wnl, NOSE-clear, THROAT-clear & wnl. NECK:  Supple w/ full ROM; no JVD; normal carotid impulses w/o bruits; no thyromegaly or nodules palpated; no lymphadenopathy. CHEST:  Clear to P & A x few scat rhonchi, no wheezing, no consolidation... HEART:  Regular Rhythm; without murmurs/ rubs/ or gallops detected... ABDOMEN:  Soft & nontender; normal bowel sounds; no organomegaly or masses palpated... EXT: without deformities or arthritic changes; no varicose veins/ venous insuffic/ or edema. NEURO:  CN's intact; motor testing normal; sensory testing normal; gait normal & balance OK. DERM:  No lesions noted; no rash etc...  RADIOLOGY DATA:  Reviewed in the EPIC EMR & discussed w/ the patient...    >>CXR 09/15/11 showed normal heart size, lungs hyperinflated w/ sl incr markings but better than recent hosp films, NAD...    >>PFTs 09/15/11 showed FVC=1.82 (65%), FEV1=1.00 (46%), %1sec=55, mid-flows=17%predicted >> c/w mod severe obstructive defect & ?superimposed restriction as well...  LABORATORY DATA:  Reviewed in the EPIC EMR & discussed w/ the patient...    >>LABS 09/15/11 showed:  CBC- ok;  Chems- K=3.1, Creat=0.7, BS=109, A1c=6.4    Assessment & Plan:   COPD w/ Hx RespFailure>> Asthma/ Asthmatic Bronchitis>  Hosp 1/13 w/ resp failure on vent x5d; event disch to rehab, then home w/ PT etc; PFTs confirm mod severe airflow obstruction & may have superimposed restrictive component; CXR w/o acute changes; REC> Pred10mg - slow tapering sched;   ADVAIR250Bid, SPIRIVA, MUCINEX Bid, Proventil prn...  HBP>  Off prev Hyzaar Rx... Paroxysmal AFib>  Occurred during the 1/13 [facility] w/ resp failure, bronchodilators, etc;  Treated w/ Adenosine x2 in ER, then Diltiazem==> now CARDIZEM CD 120mg  Bid & stable BP, heart rate & rhythm...  CHOL>  (She has signif hyperchol & we discussed this; she is unconcerned & not phased by my warnings regarding the build up of plaque in her vessels and the risk of stroke & MI;  She refuses med rx & refuses lipid clinic referral; she will do the best she can w/ diet alone; she is asked to return for f/u FLP & she will think about it she says);  Not addressed during her [facility] 1/13 & we will revisit in follow up...  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  Hx Critical illness myopathy>  She required a rehab stay in hosp; then disch w/ out pt phys therapy on-going...  Anxiety>  She has rather moderate anxiety but refuses anxiolytic therapy...   Patient's Medications  New Prescriptions   DILTIAZEM (CARDIZEM CD) 120 MG 24 HR CAPSULE    Take 1 capsule (120 mg total) by mouth 2 (two) times daily.   PREDNISONE (DELTASONE) 10 MG TABLET    Take as directed   TIOTROPIUM (SPIRIVA HANDIHALER) 18 MCG INHALATION CAPSULE    Place 1 capsule (18 mcg total) into inhaler and inhale daily.  Previous Medications   ALBUTEROL (PROVENTIL HFA;VENTOLIN HFA) 108 (90 BASE) MCG/ACT INHALER    Inhale 2 puffs into the lungs every 6 (six) hours as needed. For shortness of breath    FAMOTIDINE (PEPCID) 20 MG TABLET    Take 1 tablet (20 mg total) by mouth 2 (two) times daily.   FLUTICASONE-SALMETEROL (ADVAIR) 250-50 MCG/DOSE AEPB    Inhale 1 puff into the lungs 2 (two) times daily.    Modified Medications   No medications on  file  Discontinued Medications   DILTIAZEM (CARDIZEM) 90 MG TABLET    Take 1 tablet (90 mg total) by mouth every 6 (six) hours.   LOSARTAN-HYDROCHLOROTHIAZIDE (HYZAAR) 100-12.5  MG PER TABLET    Take 1 tablet by mouth daily.     PREDNISONE (DELTASONE) 10 MG TABLET    Two tab daily for three days then one tab daily for three days then 1/2 tab daily for three days and stop

## 2011-09-15 NOTE — Patient Instructions (Signed)
Pat> welcome back after your critical illness & prolonged hospital stay...  We have reconciled your meds and list these as current treatment>>    Prednisone 10mg  taper to take in a slow tapering schedule (see attached)...    ADVAIR 250- one inhalation twice daily...    SPIRIVA- inhale the contents of one cap daily via handihaler...    Proventil HFA (or similar Albuterol inhaler) to use as needed for "rescue"    MUCINEX 1-2 tabs twice daily w/ lots of fluids...    CARDIZEM CD 120mg  twice daily (takes the place of Diltiazem90 Q6H    Continue PEPCID 20mg  twice daily... Call & let us know if you need anything else...  Today we did your follow up CXR & blood work...    Please call the [facility]REE in a few days for your results...    Dial [id number] & when prompted enter your patient number followed by the # symbol...    Your patient number is:  [id number]#  Keep up the good work w/ your therapy...  Let's plan a follow up visit in 3-4 week.Marland KitchenMarland Kitchen

## 2011-09-17 ENCOUNTER — Ambulatory Visit: Payer: 59 | Admitting: Physical Therapy

## 2011-09-23 ENCOUNTER — Ambulatory Visit: Payer: 59 | Admitting: Physical Therapy

## 2011-09-25 ENCOUNTER — Ambulatory Visit: Payer: 59 | Admitting: Physical Therapy

## 2011-09-29 ENCOUNTER — Other Ambulatory Visit: Payer: Self-pay | Admitting: *Deleted

## 2011-09-29 ENCOUNTER — Telehealth: Payer: Self-pay | Admitting: *Deleted

## 2011-09-29 MED ORDER — POTASSIUM CHLORIDE ER 10 MEQ PO TBCR: 10.0000 meq | EXTENDED_RELEASE_TABLET | Freq: Two times a day (BID) | ORAL | Status: DC

## 2011-09-29 NOTE — Telephone Encounter (Signed)
Called and spoke with pt about her lab results per SN---pt has a follow up appt on 2/19 with SN.  cxr looked ok---clear, no infiltrates or fluid.  Labs are ok but she will need to start on potassium  1 po bid and pt is aware that this has been sent to her pharmacy.  Pt voiced her understanding of her lab results and will call for any concerns or questions.

## 2011-09-30 ENCOUNTER — Ambulatory Visit: Payer: 59 | Attending: Physical Medicine & Rehabilitation | Admitting: Physical Therapy

## 2011-09-30 DIAGNOSIS — R269 Unspecified abnormalities of gait and mobility: Secondary | ICD-10-CM | POA: Insufficient documentation

## 2011-09-30 DIAGNOSIS — M6281 Muscle weakness (generalized): Secondary | ICD-10-CM | POA: Insufficient documentation

## 2011-09-30 DIAGNOSIS — IMO0001 Reserved for inherently not codable concepts without codable children: Secondary | ICD-10-CM | POA: Insufficient documentation

## 2011-10-02 ENCOUNTER — Ambulatory Visit: Payer: 59 | Admitting: Physical Therapy

## 2011-10-07 ENCOUNTER — Ambulatory Visit: Payer: 59 | Admitting: Physical Therapy

## 2011-10-09 ENCOUNTER — Ambulatory Visit: Payer: 59 | Admitting: Physical Therapy

## 2011-10-14 ENCOUNTER — Ambulatory Visit (INDEPENDENT_AMBULATORY_CARE_PROVIDER_SITE_OTHER): Payer: 59 | Admitting: Pulmonary Disease

## 2011-10-14 ENCOUNTER — Encounter: Payer: Self-pay | Admitting: Pulmonary Disease

## 2011-10-14 ENCOUNTER — Encounter: Payer: Self-pay | Admitting: *Deleted

## 2011-10-14 VITALS — BP 152/82 | HR 96 | Temp 97.0°F | Ht 62.0 in | Wt 162.4 lb

## 2011-10-14 DIAGNOSIS — J449 Chronic obstructive pulmonary disease, unspecified: Secondary | ICD-10-CM | POA: Insufficient documentation

## 2011-10-14 DIAGNOSIS — G7281 Critical illness myopathy: Secondary | ICD-10-CM

## 2011-10-14 DIAGNOSIS — I10 Essential (primary) hypertension: Secondary | ICD-10-CM

## 2011-10-14 DIAGNOSIS — J45909 Unspecified asthma, uncomplicated: Secondary | ICD-10-CM

## 2011-10-14 DIAGNOSIS — F411 Generalized anxiety disorder: Secondary | ICD-10-CM

## 2011-10-14 MED ORDER — FAMOTIDINE 20 MG PO TABS: 20.0000 mg | ORAL_TABLET | Freq: Two times a day (BID) | ORAL | Status: DC

## 2011-10-14 NOTE — Progress Notes (Signed)
Subjective:    Patient ID: [name], female    DOB: Sep 09, 1945, 66 y.o.   MRN: [id number]  HPI 66 y/o WF here for a follow up visit & review of her mult med problems...  ~  Dec 26, 2009:  she states she had a good yr & feels great... no problems w/ her Asthma at all, she says... she had her colonoscopy by DrDBrodie 2/10 & it was neg- f/u planned 55yrs... she has not been taking her Simva40 ("I forget it half the time") & she's been "doing it on my own w/ really good diet" despite 4# weight gain... she agrees to try Crestor in the AM w/ her other meds if she needs statin therapy... she notes that BP has been great at work- all 130-140/ 70-80 she says;  but todays BP is elevated w/ sl HA & she is quite sure this is a one time aberration since ALL her prev readings were so normal at work... we discussed no salt, get weight down, take med every day, & check BP's often at home, at work, etc (she will call if BP> 150/90).  ~  April 21, 2011:  15 month ROV & refill of meds> Advair, Proventil, Hyzaar...    >She is c/o URI, cough, congestion, thick beige x3d now; sl SOB but denies CP, palpit, f/c/s, etc;  Ex-smoker, quit 2002 w/ hx Asthma & AB in past> she had a good yr taking the Advair & using the Proventil as rescue inhaler;  Exam shows scat rhonchi & we discussed RX w/ ZPak, Pred, Mucinex, Fluids, etc...    >States BP well controlled on Losartan/HCT w/ BP= 138/84 today & asymptomatic...    >Hx Hyperchol & when last seen she was to start Crestor but she decided against it & prefers diet alone even though her BEST LDL over the last few yrs= 150;  She is not fasting today, she declines to ret for FLP, she declines Lipid Clinic referral...    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  ~  September 15, 2011:  59mo ROV & Post Hosp check>>  NOTE: extensive hosp records  reviewed, MED reconciliation betw prev meds- hosp meds- rehab disch meds- completed; 50% of visit spent on counseling & review...    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 Interesting that she does not remember anything from the time she called 911, until she awoke in the hosp a day or so after the ETT was removed...    >We reconciled all her meds from our prev Outpt regimen [facility] med[facility] & finally the disch meds 1/14 when sent home  from rehab: SEE BELOW>> Today she has bilat rhonchi & exp wheezing on exam- O2sat 94% on RA rest;  CXR today shows hyperinflation & sl incr interstitial lung markings, NAD (markings are reduced from recent hosp films)... PFT shows FVC=1.82 (65%), FEV1=1.00 (46%), %1sec=55, mid-flows=17%predicted >> c/w mod severe obstructive defect & ?superimposed restriction as well... LABS reveal  CBC- wnl, Chems- ok x K=3.1, BS=109 & A1c=6.4.Marland KitchenMarland Kitchen REC> PREDNISONE 10mg /d==> slow taper to 5mg Qod by ret visit;  ADVAIR 250Bid;  SPIRIVA daily: Proventil HFA rescue prn; MUCINEX 2Bid w/ fluids; CARDIZEM CD 120mg  Bid; PEPCID 20mg  Bid; & KCL cap Bid...  ~  October 14, 2011:  47mo ROV & [name] states that she has been doing well & she is w/o complaints today; notes breathing good & she feels that she is back to  baseline "I'm fine"; she tells me she was disch from Rehab- improved... We discussed her ret to work> ok for return 10/15/11...  Currently Pred is at 5mg  Qod, Advair250Bid, Spririva daily, Proair HFA as needed & she does the Mucinex prn as well...  She requests refills for Cardizem & Pepcid...          Problem List:  Hx of ASTHMA, MODERATE >> COPD w/ Hx Acute Resp Failure 1/13 >> now on ADVAIR 250Bid, SPIRIVA daily, MUCINEX 2Bid, Fluids, PROAIR rescue... She is an ex-smoker, quit 2002> hx mod asthma w/ occas exac & mult triggers, ? of VCD in the past; she had been taking ADVAIR 250Bid and PROAIR rescue- reports no asthma attacks in several yrs; she had trip to [place] and went to [place] in the summer of 2009 without too much difficulty... ~  5/11: states good yr, no problems from her Asthma- denies cough, sputum, hemoptysis, worsening dyspnea, wheezing, chest pains, snoring, daytime hypersomnolence, etc... CXR 5/11 showed chr changes/ COPD, NAD.Marland Kitchen. ~  8/12: mild URI, COPD exac> treated w/ ZPak, Pred, Mucinex, etc... 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 ~  1/13 office f/u> CXR shows hyperinflation & sl incr interstitial lung markings, NAD (markings are reduced from recent hosp films)... PFT shows FVC=1.82 (65%), FEV1=1.00 (46%), %1sec=55, mid-flows=17%predicted >> c/w mod severe obstructive defect & ?superimposed restriction as well... Rec slow Pred taper form her 10mg /d... ~  2/13:  On Pred 5mg  Qod & doing well> we discussed weaning off this med & continue others the same...  HYPERTENSION (ICD-401.9) - hx mod HBP that was well controlled on LosartanHCT 100-12.5 daily- up until meds changed 1/13 Hosp... ~  EKG 5/11 showed NSR, WNL.Marland Kitchen. ~  5/11:  we discussed the importance of no salt, get weight down, take med every day, & check BP's often at home, at work, etc (she will call if BP>  150/90). ~  8/12: BP 138/84, she has sl HA & fatigue, but denies visual changes, CP, palipit, dizziness, syncope, dyspnea, edema, etc... ~  1/13:  BP= 112/72 post hosp f/u visit on Cardizem90Q6H & HCT12.5/d; REC> stop HCT, & change CardizemCD 120mg Bid. ~  2/13:  BP= 150/80 on Diltiazem 120mg  Bid & K10Bid; continue same & better low sodium diet...  HYPERCHOLESTEROLEMIA (ICD-272.0) - she tried  to control Chol w/ oatmeal, cheerios, and fish oil... finally agreed to start SIMVASTATIN 40mg /d in Aug09, but couldn't remember to take it half the time since it was at night... she has agreed to try CRESTOR Qam>  ~  FLP 4/08  showed TChol 241, TG 126, HDL 61, LDL 155... rec to start Simvastatin, but chose diet Rx. ~  FLP 8/09 showed TChol 252, TG 96, HDL 57, LDL 180... she agreed to start Simva40. ~  FLP 1/10 showed TChol 213, TG 72, HDL 50, LDL 150... rec to take med more regularly. ~  FLP 5/11 showed TChol 234, TG 74, HDL 68, LDL 156... admits to not taking Simva40Qpm, ch to CRESTOR10Qam. ~  She never took the Crestor & declines med rx, lipid clinic, or other interventions; she is content to manage this prob on diet alone.  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 eval from DrGioffre and Alusio in the past...  Hx CRITICAL ILLNESS MYOPATHY >> occurred after her 1/13 admission for Asthma/COPD exac w/ resp failure on vent x5d==> to rehab & eventually disch w/ outpt rehab on-going...  S/P left thumb CMC joint arthroplasty by DrGramig 4/12...  ANXIETY (ICD-300.00)   Past Surgical History  Procedure Date  . Tonsillectomy and adnoidectomy   . Cholecystectomy   . Left thumb cmc arthroplasty 11/2010    by DrGramig    Outpatient Encounter Prescriptions as of 10/14/2011  Medication Sig Dispense Refill  . albuterol (PROVENTIL HFA;VENTOLIN HFA) 108 (90 BASE) MCG/ACT inhaler Inhale 2 puffs into the lungs every 6 (six) hours as needed. For shortness of breath       . diltiazem (CARDIZEM CD) 120  MG 24 hr capsule Take 1 capsule (120 mg total) by mouth 2 (two) times daily.  60 capsule  5  . famotidine (PEPCID) 20 MG tablet Take 1 tablet (20 mg total) by mouth 2 (two) times daily.  60 tablet  0  . Fluticasone-Salmeterol (ADVAIR) 250-50 MCG/DOSE AEPB Inhale 1 puff into the lungs 2 (two) times daily.        . potassium chloride (K-DUR) 10 MEQ tablet Take 1 tablet (10 mEq total) by mouth 2 (two) times daily.  60 tablet  11  . predniSONE (DELTASONE) 10 MG tablet Take as directed  50 tablet  0  . tiotropium (SPIRIVA HANDIHALER) 18 MCG inhalation capsule Place 1 capsule (18 mcg total) into inhaler and inhale daily.  30 capsule  6    Allergies  Allergen Reactions  . Codeine     REACTION: nausea    Current Medications, Allergies, Past Medical History, Past Surgical History, Family History, and Social History were reviewed in [employer] record.    Review of Systems        The patient denies fever, chills, sweats, anorexia, fatigue, weakness, malaise, weight loss, sleep disorder, blurring, diplopia, eye irritation, eye discharge, vision loss, eye pain, photophobia, earache, ear discharge, tinnitus, decreased hearing, nasal congestion, nosebleeds, sore throat, hoarseness, chest pain, palpitations, syncope, dyspnea on exertion, orthopnea, PND, peripheral edema, cough, dyspnea at rest, excessive sputum, hemoptysis, wheezing, pleurisy, nausea, vomiting, diarrhea, constipation, change in bowel habits, abdominal pain, melena, hematochezia, jaundice, gas/bloating, indigestion/heartburn, dysphagia, odynophagia, dysuria, hematuria, urinary frequency, urinary hesitancy, nocturia, incontinence, back pain, joint pain, joint swelling, muscle cramps, muscle weakness, stiffness, arthritis, sciatica, restless legs, leg pain at night, leg pain with exertion, rash, itching, dryness, suspicious lesions, paralysis, paresthesias, seizures, tremors, vertigo, transient blindness, frequent falls,  frequent headaches, difficulty walking, depression, anxiety, memory loss, confusion, cold intolerance, heat intolerance, polydipsia, polyphagia, polyuria, unusual weight change, abnormal bruising, bleeding, enlarged lymph nodes, urticaria, allergic rash, hay fever, and recurrent infections.     Objective:   Physical Exam    WD, WN,  66 y/o WF in NAD... GENERAL:  Alert & oriented; pleasant & cooperative... HEENT:  [place]/AT, EOM-wnl, PERRLA, EACs-clear, TMs-wnl, NOSE-clear, THROAT-clear & wnl. NECK:  Supple w/ full ROM; no JVD; normal carotid impulses w/o bruits; no thyromegaly or nodules palpated; no lymphadenopathy. CHEST:  Clear to P & A x few scat rhonchi, no wheezing, no consolidation... HEART:  Regular Rhythm; without murmurs/ rubs/ or gallops detected... ABDOMEN:  Soft & nontender; normal bowel sounds; no organomegaly or masses palpated... EXT: without deformities or arthritic changes; no varicose veins/ venous insuffic/ or edema. NEURO:  CN's intact; motor testing normal; sensory testing normal; gait normal & balance OK. DERM:  No lesions noted; no rash etc...  RADIOLOGY DATA:  Reviewed in the EPIC EMR & discussed w/ the patient...    >>CXR 09/15/11 showed normal heart size, lungs hyperinflated w/ sl incr markings but better than recent hosp films, NAD...    >>PFTs 09/15/11 showed FVC=1.82 (65%), FEV1=1.00 (46%), %1sec=55, mid-flows=17%predicted >> c/w mod severe obstructive defect & ?superimposed restriction as well...  LABORATORY DATA:  Reviewed in the EPIC EMR & discussed w/ the patient...    >>LABS 09/15/11 showed:  CBC- ok;  Chems- K=3.1, Creat=0.7, BS=109, A1c=6.4    Assessment & Plan:   COPD w/ Hx RespFailure>> Asthma/ Asthmatic Bronchitis>  Hosp 1/13 w/ resp failure on vent x5d; event disch to rehab, then home w/ PT etc; PFTs confirm mod severe airflow obstruction & may have superimposed restrictive component; CXR w/o acute changes; REC> Pred10mg - slow tapering sched;   ADVAIR250Bid, SPIRIVA, MUCINEX Bid, Proventil prn==> improved back to baseline & Pred slowly weaned off...  HBP>  Off prev Hyzaar Rx... Paroxysmal AFib>  Occurred during the 1/13 [facility] w/ resp failure, bronchodilators, etc;  Treated w/ Adenosine x2 in ER, then Diltiazem==> now CARDIZEM CD 120mg  Bid & stable BP, heart rate & rhythm...  CHOL>  (She has signif hyperchol & we discussed this; she is unconcerned & not phased by my warnings regarding the build up of plaque in her vessels and the risk of stroke & MI;  She refuses med rx & refuses lipid clinic referral; she will do the best she can w/ diet alone; she is asked to return for f/u FLP & she will think about it she says);  Not addressed during her [facility] 1/13 & we will revisit in follow up...  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  Hx Critical illness myopathy>  She required a rehab stay in hosp; then disch w/ out pt phys therapy on-going...  Anxiety>  She has rather moderate anxiety but refuses anxiolytic therapy...   Patient's Medications  New Prescriptions   No medications on file  Previous Medications   ALBUTEROL (PROVENTIL HFA;VENTOLIN HFA) 108 (90 BASE) MCG/ACT INHALER    Inhale 2 puffs into the lungs every 6 (six) hours as needed. For shortness of breath    DILTIAZEM (CARDIZEM CD) 120 MG 24 HR CAPSULE    Take 1 capsule (120 mg total) by mouth 2 (two) times daily.   FAMOTIDINE (PEPCID) 20 MG TABLET    Take 1 tablet (20 mg total) by mouth 2 (two) times daily.   FLUTICASONE-SALMETEROL (ADVAIR) 250-50 MCG/DOSE AEPB    Inhale 1 puff into the lungs 2 (two) times daily.     POTASSIUM CHLORIDE (K-DUR) 10 MEQ TABLET    Take 1 tablet (10 mEq total) by mouth 2 (two) times daily.   PREDNISONE (DELTASONE) 10 MG  TABLET    Take as directed   TIOTROPIUM (SPIRIVA HANDIHALER) 18 MCG INHALATION CAPSULE    Place 1 capsule (18 mcg total) into inhaler and inhale daily.  Modified Medications   No  medications on file  Discontinued Medications   No medications on file

## 2011-10-14 NOTE — Patient Instructions (Signed)
Today we updated your med list in our EPIC system...    We decided to STOP the Prednisone, but continue the Advair/ Spiriva/ Albuterol...  We wrote a back to work note effective 10/15/11...  Call for any problems...  Let's plan a follow up visit in 2-3 months.Marland KitchenMarland Kitchen

## 2011-10-17 ENCOUNTER — Inpatient Hospital Stay: Payer: 59 | Admitting: Pulmonary Disease

## 2011-11-27 ENCOUNTER — Telehealth: Payer: Self-pay | Admitting: Pulmonary Disease

## 2011-11-27 NOTE — Telephone Encounter (Signed)
I spoke with pt and she states the day SN released her to go back to work her job at [employer] was terminated and she has ben trying to find another job. She is wanting to know if SN would be willing to help her get long term disability. Please advise SN, thanks

## 2011-11-27 NOTE — Telephone Encounter (Signed)
I spoke with pt and is aware of SN she voiced her understanding and had no questions

## 2011-11-27 NOTE — Telephone Encounter (Signed)
Per SN----we fill papers out all the time----she will need to pursue this through the benefit office to get the ball rolling if she is talking about SSI  She is looking at a very long and difficult road to get this done.  thanks

## 2011-12-15 ENCOUNTER — Encounter: Payer: Self-pay | Admitting: Pulmonary Disease

## 2011-12-15 ENCOUNTER — Ambulatory Visit (INDEPENDENT_AMBULATORY_CARE_PROVIDER_SITE_OTHER): Payer: 59 | Admitting: Pulmonary Disease

## 2011-12-15 DIAGNOSIS — I10 Essential (primary) hypertension: Secondary | ICD-10-CM

## 2011-12-15 DIAGNOSIS — F411 Generalized anxiety disorder: Secondary | ICD-10-CM

## 2011-12-15 DIAGNOSIS — I872 Venous insufficiency (chronic) (peripheral): Secondary | ICD-10-CM

## 2011-12-15 DIAGNOSIS — J449 Chronic obstructive pulmonary disease, unspecified: Secondary | ICD-10-CM

## 2011-12-15 DIAGNOSIS — E7800 Pure hypercholesterolemia, unspecified: Secondary | ICD-10-CM

## 2011-12-15 DIAGNOSIS — G7281 Critical illness myopathy: Secondary | ICD-10-CM

## 2011-12-15 DIAGNOSIS — Z8679 Personal history of other diseases of the circulatory system: Secondary | ICD-10-CM

## 2011-12-15 NOTE — Patient Instructions (Signed)
Today we updated your med list in our EPIC system...    Continue your current medications the same...  Continue your home therapy & exercise program...  For your swelling:  Avoid all sodium/ salt; keep legs elevated; wear support hose when up & about...  Call for any questions...  Let's plan a follow up visit in 4 months, sooner if needed for problems.Marland KitchenMarland Kitchen

## 2011-12-15 NOTE — Progress Notes (Signed)
Subjective:    Patient ID: [name], female    DOB: [birth year], 66 y.o.   MRN: [id number]  HPI 66 y/o WF here for a follow up visit & review of her mult med problems...  ~  Dec 26, 2009:  she states she had a good yr & feels great... no problems w/ her Asthma at all, she says... she had her colonoscopy by DrDBrodie 2/10 & it was neg- f/u planned 66yrs... she has not been taking her Simva40 ("I forget it half the time") & she's been "doing it on my own w/ really good diet" despite 4# weight gain... she agrees to try Crestor in the AM w/ her other meds if she needs statin therapy... she notes that BP has been great at work- all 130-140/ 70-80 she says;  but todays BP is elevated w/ sl HA & she is quite sure this is a one time aberration since ALL her prev readings were so normal at work... we discussed no salt, get weight down, take med every day, & check BP's often at home, at work, etc (she will call if BP> 150/90).  ~  April 21, 2011:  15 month ROV & refill of meds> Advair, Proventil, Hyzaar...    >She is c/o URI, cough, congestion, thick beige x3d now; sl SOB but denies CP, palpit, f/c/s, etc;  Ex-smoker, quit 2002 w/ hx Asthma & AB in past> she had a good yr taking the Advair & using the Proventil as rescue inhaler;  Exam shows scat rhonchi & we discussed RX w/ ZPak, Pred, Mucinex, Fluids, etc...    >States BP well controlled on Losartan/HCT w/ BP= 138/84 today & asymptomatic...    >Hx Hyperchol & when last seen she was to start Crestor but she decided against it & prefers diet alone even though her BEST LDL over the last few yrs= 150;  She is not fasting today, she declines to ret for FLP, she declines Lipid Clinic referral...    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  ~  September 15, 2011:  85mo ROV & Post Hosp check>>  NOTE: extensive hosp records  reviewed, MED reconciliation betw prev meds- hosp meds- rehab disch meds- completed; 50% of visit spent on counseling & review...    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 Interesting that she does not remember anything from the time she called 911, until she awoke in the hosp a day or so after the ETT was removed...    >We reconciled all her meds from our prev Outpt regimen to the [facility] meds & finally the disch meds 1/14 when sent home  from rehab: SEE BELOW>>  Today she has bilat rhonchi & exp wheezing on exam- O2sat 94% on RA rest;   CXR today shows hyperinflation & sl incr interstitial lung markings, NAD (markings are reduced from recent hosp films)...  PFT shows FVC=1.82 (65%), FEV1=1.00 (46%), %1sec=55, mid-flows=17%predicted >> c/w mod severe obstructive defect & ?superimposed restriction as well...  LABS reveal  CBC- wnl, Chems- ok x K=3.1, BS=109 & A1c=6.4.Marland KitchenMarland Kitchen REC> PREDNISONE 10mg /d==> slow taper to 5mg Qod by ret visit;  ADVAIR 250Bid;  SPIRIVA daily: Proventil HFA rescue prn; MUCINEX 2Bid w/ fluids; CARDIZEM CD 120mg  Bid; PEPCID 20mg  Bid; & KCL cap Bid...  ~  October 14, 2011:  40mo ROV & [name] states that she has been doing well & she is w/o complaints today; notes breathing good & she feels that she is  back to baseline "I'm fine"; she tells me she was disch from Rehab- improved... We discussed her ret to work> ok for return 10/15/11...  Currently Pred is at 5mg  Qod, Advair250Bid, Spririva daily, Proair HFA as needed & she does the Mucinex prn as well...  She requests refills for Cardizem & Pepcid...  ~  December 15, 2011:  75mo ROV & [name] has had an emotional roller coaster> tried to ret to work 10/15/11 but they had elim her job & has retained a Clinical research associate but doesn't know what to do; boyfriend Homero Fellers lives in [place] & she doesn't want to rush decisions; her breathing has been good- weaned off the Pred & taking Advair/ Spiriva regularly & hasn't needed the AlbutMDI; notes some swelling in ankles related to her underlying VI & we discussed no salt, elevation, support hose... See prob list below>>   Problem List:     << PROBLEM LIST UPDATED 12/15/11 >>  Hx of ASTHMA, MODERATE >> COPD w/ Hx Acute Resp Failure 1/13 >> now on ADVAIR 250Bid, SPIRIVA daily, MUCINEX 2Bid, Fluids, PROAIR rescue... She is an ex-smoker, quit 2002> hx mod asthma w/ occas exac & mult triggers, ? of VCD in the past; she had been taking ADVAIR 250Bid and PROAIR rescue- reports no asthma attacks in several yrs; she had trip to [place] and went to [place] in the summer of 2009 without too much difficulty... ~  5/11: states good yr, no problems from her Asthma- denies cough, sputum, hemoptysis, worsening dyspnea, wheezing, chest pains, snoring, daytime hypersomnolence, etc... CXR 5/11 showed chr changes/ COPD, NAD.Marland Kitchen. ~  8/12: mild URI, COPD exac> treated w/ ZPak, Pred, Mucinex, etc... 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 ~  1/13 office f/u> CXR shows hyperinflation & sl incr interstitial lung markings, NAD (markings are reduced from recent hosp films)... PFT shows FVC=1.82 (65%), FEV1=1.00 (46%), %1sec=55,  mid-flows=17%predicted >> c/w mod severe obstructive defect & ?superimposed restriction as well... Rec slow Pred taper form her 10mg /d... ~  2/13:  On Pred 5mg  Qod & doing well> we discussed weaning off this med & continue others the same... ~  4/13:  Pt weaned off Pred & stable on Advair250 & Spiriva; chest is clear, moving air well...  HYPERTENSION (ICD-401.9) - hx mod HBP that was well controlled on LosartanHCT up until meds changed 1/13 Hosp... ~  EKG 5/11 showed NSR, WNL.Marland Kitchen. ~  5/11:  we discussed the importance of no salt, get weight down, take med every day, & check BP's often at home, at work, etc (  she will call if BP> 150/90). ~  8/12: BP 138/84, she has sl HA & fatigue, but denies visual changes, CP, palipit, dizziness, syncope, dyspnea, edema, etc... ~  1/13:  BP= 112/72 post hosp f/u visit on Cardizem90Q6H & HCT12.5/d; REC> stop HCT, & change CardizemCD 120mg Bid. ~  2/13:  BP= 150/80 on Diltiazem 120mg  Bid & K10Bid; continue same & better low sodium diet... ~  4/13:  BP= 136/84 & she is mostly asymptomatic...  Paroxysmal ATRIAL FIBRILLATION >> this occurred during her hosp 1/13 w/ resp failure; she converted to NSR w/ Cardizem drip & has maintained NSR on Cardizem orally; denies CP/ palpit/ dizzy/ SOB, etc...  VENOUS INSUFFICIENCY >> she has mod VI & trace edema; we discussed no salt, elevate legs, wear support hose, etc...  HYPERCHOLESTEROLEMIA (ICD-272.0) - she tried  to control Chol w/ oatmeal, cheerios, and fish oil; finally agreed to start SIMVASTATIN 40mg /d in Aug09, but couldn't remember to take it half the time since it was at night... she has agreed to try CRESTOR Qam>  ~  FLP 4/08 showed TChol 241, TG 126, HDL 61, LDL 155... rec to start Simvastatin, but chose diet Rx. ~  FLP 8/09 showed TChol 252, TG 96, HDL 57, LDL 180... she agreed to start Simva40. ~  FLP 1/10 showed TChol 213, TG 72, HDL 50, LDL 150... rec to take med more regularly. ~  FLP 5/11 showed TChol 234, TG 74,  HDL 68, LDL 156... admits to not taking Simva40Qpm, ch to CRESTOR10Qam. ~  She never took the Crestor & declines med rx, lipid clinic, or other interventions; she is content to manage this prob on diet alone. ~  4/13:  It has been 105yrs since she's had fasting labs; asked to get on her best diet & lifestyle change for FLP on ret...  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 eval from DrGioffre and Alusio in the past... ~  4/13:  She notes that back is ok...  Hx CRITICAL ILLNESS MYOPATHY >> occurred after her 1/13 admission for Asthma/COPD exac w/ resp failure on vent x5d==> to rehab & eventually disch w/ outpt rehab on-going... ~  4/13:  She feels back to baseline, did some yard work, mowing, gardening, etc...  S/P left thumb CMC joint arthroplasty by DrGramig 4/12...  ANXIETY (ICD-300.00)   Past Surgical History  Procedure Date  . Tonsillectomy and adnoidectomy   . Cholecystectomy   . Left thumb cmc arthroplasty 11/2010    by DrGramig    Outpatient Encounter Prescriptions as of 12/15/2011  Medication Sig Dispense Refill  . albuterol (PROVENTIL HFA;VENTOLIN HFA) 108 (90 BASE) MCG/ACT inhaler Inhale 2 puffs into the lungs every 6 (six) hours as needed. For shortness of breath       . diltiazem (CARDIZEM CD) 120 MG 24 hr capsule Take 1 capsule (120 mg total) by mouth 2 (two) times daily.  60 capsule  5  . famotidine (PEPCID) 20 MG tablet Take 1 tablet (20 mg total) by mouth 2 (two) times daily.  60 tablet  5  . Fluticasone-Salmeterol (ADVAIR) 250-50 MCG/DOSE AEPB Inhale 1 puff into the lungs 2 (two) times daily.        . potassium chloride (K-DUR) 10 MEQ tablet Take 1 tablet (10 mEq total) by mouth 2 (two) times daily.  60 tablet  11  . tiotropium (SPIRIVA HANDIHALER) 18 MCG inhalation capsule Place 1 capsule (18 mcg total) into inhaler and inhale daily.  30 capsule  6  . predniSONE (DELTASONE) 10 MG tablet Take as directed  50 tablet  0    Allergies  Allergen Reactions  .  Codeine     REACTION: nausea    Current Medications, Allergies, Past Medical History, Past Surgical History, Family History, and Social History were reviewed in [employer] record.    Review of Systems        The patient denies fever, chills, sweats, anorexia, fatigue, weakness, malaise, weight loss, sleep disorder, blurring, diplopia, eye irritation, eye discharge, vision loss, eye pain, photophobia, earache, ear discharge, tinnitus, decreased hearing, nasal congestion, nosebleeds, sore throat, hoarseness, chest pain, palpitations, syncope, dyspnea on exertion, orthopnea, PND, peripheral edema, cough, dyspnea at rest, excessive sputum, hemoptysis, wheezing, pleurisy, nausea, vomiting, diarrhea, constipation, change in bowel habits, abdominal pain, melena, hematochezia, jaundice, gas/bloating, indigestion/heartburn, dysphagia, odynophagia, dysuria, hematuria, urinary frequency, urinary hesitancy, nocturia, incontinence, back pain, joint pain, joint swelling, muscle cramps, muscle weakness, stiffness, arthritis, sciatica, restless legs, leg pain at night, leg pain with exertion, rash, itching, dryness, suspicious lesions, paralysis, paresthesias, seizures, tremors, vertigo, transient blindness, frequent falls, frequent headaches, difficulty walking, depression, anxiety, memory loss, confusion, cold intolerance, heat intolerance, polydipsia, polyphagia, polyuria, unusual weight change, abnormal bruising, bleeding, enlarged lymph nodes, urticaria, allergic rash, hay fever, and recurrent infections.     Objective:   Physical Exam    WD, WN, 66 y/o WF in NAD... GENERAL:  Alert & oriented; pleasant & cooperative... HEENT:  [place]/AT, EOM-wnl, PERRLA, EACs-clear, TMs-wnl, NOSE-clear, THROAT-clear & wnl. NECK:  Supple w/ full ROM; no JVD; normal carotid impulses w/o bruits; no thyromegaly or nodules palpated; no lymphadenopathy. CHEST:  Clear to P & A x few scat rhonchi, no wheezing, no  consolidation... HEART:  Regular Rhythm; without murmurs/ rubs/ or gallops detected... ABDOMEN:  Soft & nontender; normal bowel sounds; no organomegaly or masses palpated... EXT: without deformities or arthritic changes; no varicose veins/ venous insuffic/ or edema. NEURO:  CN's intact; motor testing normal; sensory testing normal; gait normal & balance OK. DERM:  No lesions noted; no rash etc...  RADIOLOGY DATA:  Reviewed in the EPIC EMR & discussed w/ the patient...    >>CXR 09/15/11 showed normal heart size, lungs hyperinflated w/ sl incr markings but better than recent hosp films, NAD...    >>PFTs 09/15/11 showed FVC=1.82 (65%), FEV1=1.00 (46%), %1sec=55, mid-flows=17%predicted >> c/w mod severe obstructive defect & ?superimposed restriction as well...  LABORATORY DATA:  Reviewed in the EPIC EMR & discussed w/ the patient...    >>LABS 09/15/11 showed:  CBC- ok;  Chems- K=3.1, Creat=0.7, BS=109, A1c=6.4    Assessment & Plan:   COPD w/ Hx RespFailure>> Asthma/ Asthmatic Bronchitis>  Hosp 1/13 w/ resp failure on vent x5d; event disch to rehab, then home w/ PT etc; PFTs confirm mod severe airflow obstruction & may have superimposed restrictive component; CXR w/o acute changes; REC> continue ADVAIR250Bid, SPIRIVA, MUCINEX Bid, Proventil prn==> improved back to baseline & Pred weaned off now...  HBP>  Controlled on diet, no salt, & Diltiazem CD 120mg  Bid... Paroxysmal AFib>  Occurred during the 1/13 [facility] w/ resp failure, bronchodilators, etc;  Treated w/ Adenosine x2 in ER, then Diltiazem==> now CARDIZEM CD 120mg  Bid & stable BP, heart rate & rhythm...  Venous Insuffic>  She has mod VI changes and trace edema in ankles; discussed Rx w/ no salt, elevation, support hose...  CHOL>  She has signif hyperchol & we discussed this; she is unconcerned & not phased by my warnings regarding  the build up of plaque in her vessels and the risk of stroke & MI;  She has refused med rx & refused lipid clinic  referral; she will do the best she can w/ diet alone; she is asked to return for f/u FLP & she will think about it she says...  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  Hx Critical illness myopathy>  She required a rehab stay in hosp; then disch w/ out pt phys therapy; now back to her baseline she says...  Anxiety>  She has rather moderate anxiety but refuses anxiolytic therapy...   Patient's Medications  New Prescriptions   No medications on file  Previous Medications   ALBUTEROL (PROVENTIL HFA;VENTOLIN HFA) 108 (90 BASE) MCG/ACT INHALER    Inhale 2 puffs into the lungs every 6 (six) hours as needed. For shortness of breath    DILTIAZEM (CARDIZEM CD) 120 MG 24 HR CAPSULE    Take 1 capsule (120 mg total) by mouth 2 (two) times daily.   FAMOTIDINE (PEPCID) 20 MG TABLET    Take 1 tablet (20 mg total) by mouth 2 (two) times daily.   FLUTICASONE-SALMETEROL (ADVAIR) 250-50 MCG/DOSE AEPB    Inhale 1 puff into the lungs 2 (two) times daily.     POTASSIUM CHLORIDE (K-DUR) 10 MEQ TABLET    Take 1 tablet (10 mEq total) by mouth 2 (two) times daily.   PREDNISONE (DELTASONE) 10 MG TABLET    Take as directed   TIOTROPIUM (SPIRIVA HANDIHALER) 18 MCG INHALATION CAPSULE    Place 1 capsule (18 mcg total) into inhaler and inhale daily.  Modified Medications   No medications on file  Discontinued Medications   No medications on file

## 2012-03-08 ENCOUNTER — Other Ambulatory Visit: Payer: Self-pay | Admitting: *Deleted

## 2012-03-08 MED ORDER — FAMOTIDINE 20 MG PO TABS: 20.0000 mg | ORAL_TABLET | Freq: Two times a day (BID) | ORAL | Status: DC

## 2012-04-19 ENCOUNTER — Ambulatory Visit: Payer: 59 | Admitting: Pulmonary Disease

## 2012-04-30 ENCOUNTER — Encounter: Payer: Self-pay | Admitting: Pulmonary Disease

## 2012-04-30 ENCOUNTER — Ambulatory Visit (INDEPENDENT_AMBULATORY_CARE_PROVIDER_SITE_OTHER): Payer: 59 | Admitting: Pulmonary Disease

## 2012-04-30 VITALS — BP 162/90 | HR 93 | Temp 97.9°F | Ht 62.0 in | Wt 174.6 lb

## 2012-04-30 DIAGNOSIS — I10 Essential (primary) hypertension: Secondary | ICD-10-CM

## 2012-04-30 DIAGNOSIS — F411 Generalized anxiety disorder: Secondary | ICD-10-CM

## 2012-04-30 DIAGNOSIS — R609 Edema, unspecified: Secondary | ICD-10-CM

## 2012-04-30 DIAGNOSIS — J4489 Other specified chronic obstructive pulmonary disease: Secondary | ICD-10-CM

## 2012-04-30 DIAGNOSIS — I872 Venous insufficiency (chronic) (peripheral): Secondary | ICD-10-CM

## 2012-04-30 DIAGNOSIS — J069 Acute upper respiratory infection, unspecified: Secondary | ICD-10-CM

## 2012-04-30 DIAGNOSIS — J449 Chronic obstructive pulmonary disease, unspecified: Secondary | ICD-10-CM

## 2012-04-30 MED ORDER — METHYLPREDNISOLONE 4 MG PO KIT: PACK | ORAL | Status: AC

## 2012-04-30 MED ORDER — FUROSEMIDE 20 MG PO TABS: 20.0000 mg | ORAL_TABLET | Freq: Every day | ORAL | Status: DC

## 2012-04-30 MED ORDER — AZITHROMYCIN 250 MG PO TABS: ORAL_TABLET | ORAL | Status: AC

## 2012-04-30 NOTE — Patient Instructions (Addendum)
Today we updated your med list in our EPIC system...    Continue your current medications the same...  We decided to treat your upper resp infection w/ a ZPak & Medrol Dosepak- take as directed on the packs...  Continue the Mucinex & fluids, but remember- no salt!  We also started LASIX 20mg  /d for the edema...  Call for any questions...  Let's plan a follow up visit in 4 months w/ FASTING blood work at that time.Marland KitchenMarland Kitchen

## 2012-04-30 NOTE — Progress Notes (Addendum)
Subjective:    Patient ID: [name], female    DOB: August 23, 1946, 66 y.o.   MRN: [id number]  HPI 66 y/o WF here for a follow up visit & review of her mult med problems...  ~  Dec 26, 2009:  she states she had a good yr & feels great... no problems w/ her Asthma at all, she says... she had her colonoscopy by [name] 2/10 & it was neg- f/u planned 29yrs... she has not been taking her Simva40 ("I forget it half the time") & she's been "doing it on my own w/ really good diet" despite 4# weight gain... she agrees to try Crestor in the AM w/ her other meds if she needs statin therapy... she notes that BP has been great at work- all 130-140/ 70-80 she says;  but todays BP is elevated w/ sl HA & she is quite sure this is a one time aberration since ALL her prev readings were so normal at work... we discussed no salt, get weight down, take med every day, & check BP's often at home, at work, etc (she will call if BP> 150/90).  ~  April 21, 2011:  15 month ROV & refill of meds> Advair, Proventil, Hyzaar...    >She is c/o URI, cough, congestion, thick beige x3d now; sl SOB but denies CP, palpit, f/c/s, etc;  Ex-smoker, quit 2002 w/ hx Asthma & AB in past> she had a good yr taking the Advair & using the Proventil as rescue inhaler;  Exam shows scat rhonchi & we discussed RX w/ ZPak, Pred, Mucinex, Fluids, etc...    >States BP well controlled on Losartan/HCT w/ BP= 138/84 today & asymptomatic...    >Hx Hyperchol & when last seen she was to start Crestor but she decided against it & prefers diet alone even though her BEST LDL over the last few yrs= 150;  She is not fasting today, she declines to ret for FLP, she declines Lipid Clinic referral...    >She had left thumb CMC joint arthroplasty for severe arthritis by [name] 4/12> medical record reviewed, no CXR done, labs showed normal CBC/ Chems/ PT/ PTT/ MRSA screen...  ~  September 15, 2011:  24mo ROV & Post Hosp check>>  NOTE: extensive hosp records  reviewed, MED reconciliation betw prev meds- hosp meds- rehab disch meds- completed; 50% of visit spent on counseling & review...    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 Interesting that she does not remember anything from the time she called 911, until she awoke in the hosp a day or so after the ETT was removed...    >We reconciled all her meds from our prev Outpt regimen to the [name] meds & finally the disch meds 1/14 when sent home  from rehab: SEE BELOW>>  Today she has bilat rhonchi & exp wheezing on exam- O2sat 94% on RA rest;   CXR today shows hyperinflation & sl incr interstitial lung markings, NAD (markings are reduced from recent hosp films)...  PFT shows FVC=1.82 (65%), FEV1=1.00 (46%), %1sec=55, mid-flows=17%predicted >> c/w mod severe obstructive defect & ?superimposed restriction as well...  LABS reveal  CBC- wnl, Chems- ok x K=3.1, BS=109 & A1c=6.4.Marland KitchenMarland Kitchen REC> PREDNISONE 10mg /d==> slow taper to 5mg Qod by ret visit;  ADVAIR 250Bid;  SPIRIVA daily: Proventil HFA rescue prn; MUCINEX 2Bid w/ fluids; CARDIZEM CD 120mg  Bid; PEPCID 20mg  Bid; & KCL cap Bid...  ~  October 14, 2011:  78mo ROV & [name] states that she has been doing well & she is w/o complaints today; notes breathing good & she feels that she is  back to baseline "I'm fine"; she tells me she was disch from Rehab- improved... We discussed her ret to work> ok for return 10/15/11...  Currently Pred is at 5mg  Qod, Advair250Bid, Spririva daily, Proair HFA as needed & she does the Mucinex prn as well...  She requests refills for Cardizem & Pepcid...  ~  December 15, 2011:  62mo ROV & [name] has had an emotional roller coaster> tried to ret to work 10/15/11 but they had elim her job & has retained a Clinical research associate but doesn't know what to do; boyfriend [name] lives in [place] & she doesn't want to rush decisions; her breathing has been good- weaned off the Pred & taking Advair/ Spiriva regularly & hasn't needed the AlbutMDI; notes some swelling in ankles related to her underlying VI & we discussed no salt, elevation, support hose... See prob list below>>  ~  April 30, 2012:  4-778mo ROV recently ret from a big trip to [place]- [place], [employer], [employer]atering manager & she notes some SOB but did well & even surprised herself- lots of walking, stairs, etc... She contracted a URI- congestion, ST, cough w/o sput, some incr SOB (on Advair/ Spiriva/ albutHFA & off Pred); also notes some swelling & BP today 160/90 (on Cardiz120Bid)==> we discussed Rx w/ ZPak, Medrol Dosepak, Mucinex; plus LASIX20...    We reviewed prob list, meds, xrays and labs> see below for updates >>    Problem List:       Hx of ASTHMA, MODERATE >> COPD w/ Hx Acute Resp Failure 1/13 >> now on ADVAIR 250Bid, SPIRIVA daily, MUCINEX 2Bid, Fluids, PROAIR rescue... She is an ex-smoker, quit 2002> hx mod asthma w/ occas exac & mult triggers, ? of VCD in the past; she had been taking ADVAIR 250Bid and PROAIR rescue- reports no asthma attacks in several yrs; she had trip to [place] and went to [place] in the summer of 2009 without too much difficulty... ~  5/11: states good yr, no problems from her Asthma- denies cough, sputum, hemoptysis, worsening dyspnea, wheezing, chest pains, snoring, daytime hypersomnolence, etc... CXR  5/11 showed chr changes/ COPD, NAD.Marland Kitchen. ~  8/12: mild URI, COPD exac> treated w/ ZPak, Pred, Mucinex, etc... 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 ~  1/13 office f/u> CXR shows hyperinflation & sl incr interstitial lung markings, NAD (markings are reduced from recent hosp films)... PFT shows FVC=1.82 (65%), FEV1=1.00 (46%), %1sec=55, mid-flows=17%predicted >> c/w mod severe obstructive defect & ?superimposed restriction as well... Rec slow Pred taper form her 10mg /d... ~  2/13:  On Pred 5mg   Qod & doing well> we discussed weaning off this med & continue others the same; CXR showed normal heart size, sl hyperinflation & incr interstitial markings, NAD... ~  4/13:  Pt weaned off Pred & stable on Advair250 & Spiriva; chest is clear, moving air well... ~  9/13:  She reports a great trip to [place]- walking, stairs, etc; ret w/ URI- cough, congestion & Rx w/ ZPak/ MedrolDosepak, Mucinex...  HYPERTENSION (ICD-401.9) - hx mod HBP that was well controlled on LosartanHCT up until meds changed 1/13 Hosp... ~  EKG 5/11 showed NSR, WNL.Marland Kitchen. ~  5/11:  we discussed the importance of no salt, get weight down, take med every day, & check BP's often at home, at work, etc (she will call if BP> 150/90). ~  8/12: BP 138/84, she has sl HA & fatigue, but denies visual changes, CP, palipit, dizziness, syncope, dyspnea, edema, etc... ~  1/13:  BP= 112/72 post hosp f/u visit on Cardizem90Q6H & HCT12.5/d; REC> stop HCT, & change CardizemCD 120mg Bid. ~  2/13:  BP= 150/80 on Diltiazem 120mg  Bid & K10Bid; continue same & better low sodium diet... ~  4/13:  BP= 136/84 & she is mostly asymptomatic... ~  9/13:  BP= 160/90 on Cardizem120Bid; rec to restrict Sodium, add LASIX 20mg /d...  Paroxysmal ATRIAL FIBRILLATION >> this occurred during her hosp 1/13 w/ resp failure; she converted to NSR w/ Cardizem drip & has  maintained NSR on Cardizem orally; denies CP/ palpit/ dizzy/ SOB, etc...  VENOUS INSUFFICIENCY >> she has mod VI & trace edema; we discussed no salt, elevate legs, wear support hose, etc...  HYPERCHOLESTEROLEMIA (ICD-272.0) - she tried  to control Chol w/ oatmeal, cheerios, and fish oil; finally agreed to start Simvastatin 40mg /d in Aug09, but couldn't remember to take it half the time since it was at night... she has agreed to try Crestor Qam>  ~  FLP 4/08 showed TChol 241, TG 126, HDL 61, LDL 155... rec to start Simvastatin, but chose diet Rx. ~  FLP 8/09 showed TChol 252, TG 96, HDL 57, LDL 180... she agreed to start Simva40. ~  FLP 1/10 showed TChol 213, TG 72, HDL 50, LDL 150... rec to take med more regularly. ~  FLP 5/11 showed TChol 234, TG 74, HDL 68, LDL 156... admits to not taking Simva40Qpm, ch to Crestor10Qam. ~  She never took the Crestor & declines med rx, lipid clinic, or other interventions; she is content to manage this prob on diet alone. ~  4/13:  It has been 40yrs since she's had fasting labs; asked to get on her best diet & lifestyle change for FLP on ret...  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 eval from DrGioffre and Alusio in the past... ~  4/13:  She notes that her back is ok...  Hx CRITICAL ILLNESS MYOPATHY >> occurred after her 1/13 admission for Asthma/COPD exac w/ resp failure on vent x5d==> to rehab & eventually disch w/ outpt rehab on-going... ~  4/13:  She feels back to baseline, did some yard work, mowing, gardening, etc...  S/P left thumb CMC joint arthroplasty by [name] 4/12...  ANXIETY (ICD-300.00)   Past Surgical History  Procedure Date  . Tonsillectomy and adnoidectomy   . Cholecystectomy   . Left thumb cmc arthroplasty 11/2010    by [name]    Outpatient Encounter Prescriptions as of 04/30/2012  Medication Sig Dispense Refill  . albuterol (PROVENTIL HFA;VENTOLIN HFA) 108 (90 BASE) MCG/ACT inhaler Inhale 2 puffs  into the lungs  every 6 (six) hours as needed. For shortness of breath       . diltiazem (CARDIZEM CD) 120 MG 24 hr capsule Take 1 capsule (120 mg total) by mouth 2 (two) times daily.  60 capsule  5  . famotidine (PEPCID) 20 MG tablet Take 1 tablet (20 mg total) by mouth 2 (two) times daily.  60 tablet  5  . Fluticasone-Salmeterol (ADVAIR) 250-50 MCG/DOSE AEPB Inhale 1 puff into the lungs 2 (two) times daily.        . potassium chloride (K-DUR) 10 MEQ tablet Take 1 tablet (10 mEq total) by mouth 2 (two) times daily.  60 tablet  11  . predniSONE (DELTASONE) 10 MG tablet Take as directed  50 tablet  0  . tiotropium (SPIRIVA HANDIHALER) 18 MCG inhalation capsule Place 1 capsule (18 mcg total) into inhaler and inhale daily.  30 capsule  6    Allergies  Allergen Reactions  . Codeine     REACTION: nausea    Current Medications, Allergies, Past Medical History, Past Surgical History, Family History, and Social History were reviewed in [employer] record.    Review of Systems        The patient denies fever, chills, sweats, anorexia, fatigue, weakness, malaise, weight loss, sleep disorder, blurring, diplopia, eye irritation, eye discharge, vision loss, eye pain, photophobia, earache, ear discharge, tinnitus, decreased hearing, nasal congestion, nosebleeds, sore throat, hoarseness, chest pain, palpitations, syncope, dyspnea on exertion, orthopnea, PND, peripheral edema, cough, dyspnea at rest, excessive sputum, hemoptysis, wheezing, pleurisy, nausea, vomiting, diarrhea, constipation, change in bowel habits, abdominal pain, melena, hematochezia, jaundice, gas/bloating, indigestion/heartburn, dysphagia, odynophagia, dysuria, hematuria, urinary frequency, urinary hesitancy, nocturia, incontinence, back pain, joint pain, joint swelling, muscle cramps, muscle weakness, stiffness, arthritis, sciatica, restless legs, leg pain at night, leg pain with exertion, rash, itching, dryness, suspicious lesions,  paralysis, paresthesias, seizures, tremors, vertigo, transient blindness, frequent falls, frequent headaches, difficulty walking, depression, anxiety, memory loss, confusion, cold intolerance, heat intolerance, polydipsia, polyphagia, polyuria, unusual weight change, abnormal bruising, bleeding, enlarged lymph nodes, urticaria, allergic rash, hay fever, and recurrent infections.     Objective:   Physical Exam    WD, WN, 66 y/o WF in NAD... GENERAL:  Alert & oriented; pleasant & cooperative... HEENT:  [place]/AT, EOM-wnl, PERRLA, EACs-clear, TMs-wnl, NOSE-clear, THROAT-clear & wnl. NECK:  Supple w/ full ROM; no JVD; normal carotid impulses w/o bruits; no thyromegaly or nodules palpated; no lymphadenopathy. CHEST:  Clear to P & A x few scat rhonchi, no wheezing, no consolidation... HEART:  Regular Rhythm; without murmurs/ rubs/ or gallops detected... ABDOMEN:  Soft & nontender; normal bowel sounds; no organomegaly or masses palpated... EXT: without deformities or arthritic changes; no varicose veins/ venous insuffic/ or edema. NEURO:  CN's intact; motor testing normal; sensory testing normal; gait normal & balance OK. DERM:  No lesions noted; no rash etc...  RADIOLOGY DATA:  Reviewed in the EPIC EMR & discussed w/ the patient...    >>CXR 09/15/11 showed normal heart size, lungs hyperinflated w/ sl incr markings but better than recent hosp films, NAD...    >>PFTs 09/15/11 showed FVC=1.82 (65%), FEV1=1.00 (46%), %1sec=55, mid-flows=17%predicted >> c/w mod severe obstructive defect & ?superimposed restriction as well...  LABORATORY DATA:  Reviewed in the EPIC EMR & discussed w/ the patient...    >>LABS 09/15/11 showed:  CBC- ok;  Chems- K=3.1, Creat=0.7, BS=109, A1c=6.4    Assessment & Plan:   COPD w/ Hx RespFailure>> Asthma/ Asthmatic Bronchitis>  Hosp 1/13 w/ resp failure on vent x5d; event disch to rehab, then home w/ PT etc; PFTs confirm mod severe airflow obstruction & may have superimposed  restrictive component; CXR w/o acute changes; REC> continue ADVAIR250Bid, SPIRIVA, MUCINEX Bid, Proventil prn==> improved back to baseline & Pred weaned off now... ~  9/13> we will treat her post vacation URI w/ ZPak, Medrol Dosepak, Mucinex, etc...  HBP>  Borderline ontrol on diet, no salt, & Diltiazem CD 120mg  Bid... We will add LASIX20... Paroxysmal AFib>  Occurred during the 1/13 [facility] w/ resp failure, bronchodilators, etc;  Treated w/ Adenosine x2 in ER, then Diltiazem==> now CARDIZEM CD 120mg  Bid & stable BP, heart rate & rhythm...  Venous Insuffic>  She has mod VI changes and trace edema in ankles; discussed Rx w/ no salt, elevation, support hose... Add Lasix20...  CHOL>  She has signif hyperchol & we discussed this; she is unconcerned & not phased by my warnings regarding the build up of plaque in her vessels and the risk of stroke & MI;  She has refused med rx & refused lipid clinic referral; she will do the best she can w/ diet alone; she is asked to return for f/u FLP & she will think about it she says...  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  Hx Critical illness myopathy>  She required a rehab stay in hosp; then disch w/ out pt phys therapy; now back to her baseline she says...  Anxiety>  She has rather moderate anxiety but refuses anxiolytic therapy...   Patient's Medications  New Prescriptions   AZITHROMYCIN (ZITHROMAX) 250 MG TABLET    Take as directed   FUROSEMIDE (LASIX) 20 MG TABLET    Take 1 tablet (20 mg total) by mouth daily.   METHYLPREDNISOLONE (MEDROL, PAK,) 4 MG TABLET    follow package directions  Previous Medications   ALBUTEROL (PROVENTIL HFA;VENTOLIN HFA) 108 (90 BASE) MCG/ACT INHALER    Inhale 2 puffs into the lungs every 6 (six) hours as needed. For shortness of breath    DILTIAZEM (CARDIZEM CD) 120 MG 24 HR CAPSULE    Take 1 capsule (120 mg total) by mouth 2 (two) times daily.   FAMOTIDINE (PEPCID) 20 MG TABLET     Take 1 tablet (20 mg total) by mouth 2 (two) times daily.   FLUTICASONE-SALMETEROL (ADVAIR) 250-50 MCG/DOSE AEPB    Inhale 1 puff into the lungs 2 (two) times daily.     POTASSIUM CHLORIDE (K-DUR) 10 MEQ TABLET    Take 1 tablet (10 mEq total) by mouth 2 (two) times daily.   PREDNISONE (DELTASONE) 10 MG TABLET    Take as directed   TIOTROPIUM (SPIRIVA HANDIHALER) 18 MCG INHALATION CAPSULE    Place 1 capsule (18 mcg total) into inhaler and inhale daily.  Modified Medications   No medications on file  Discontinued Medications   No medications on file

## 2012-05-06 ENCOUNTER — Other Ambulatory Visit: Payer: Self-pay | Admitting: Pulmonary Disease

## 2012-05-24 ENCOUNTER — Other Ambulatory Visit: Payer: Self-pay | Admitting: Pulmonary Disease

## 2012-06-25 ENCOUNTER — Other Ambulatory Visit: Payer: Self-pay | Admitting: Pulmonary Disease

## 2012-06-27 IMAGING — CR DG CHEST 2V
2 series · 2 of 2 positions shown · non-contrast
Comparison: 08/30/2011

CLINICAL DATA: Asthma.  Evaluate for pneumonia.

CHEST - 2 VIEW

[view not recorded (1 of 2)]
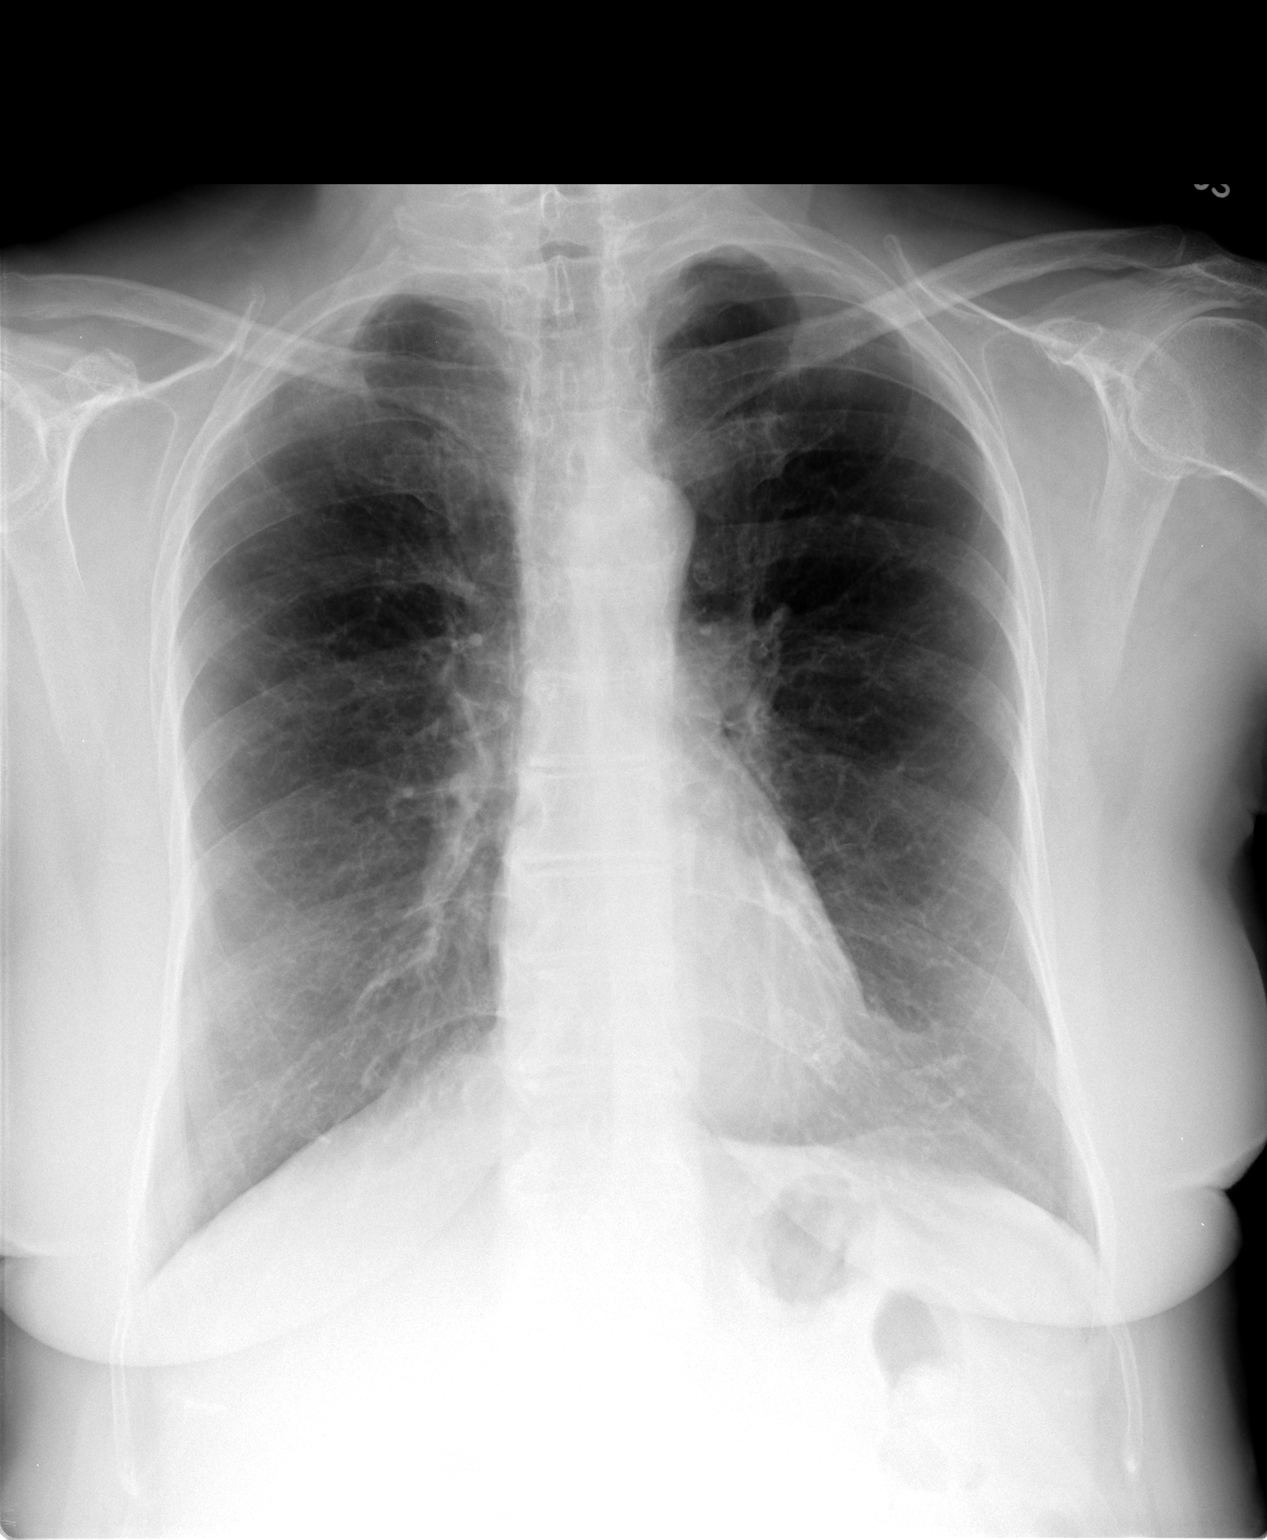

[view not recorded (2 of 2)]
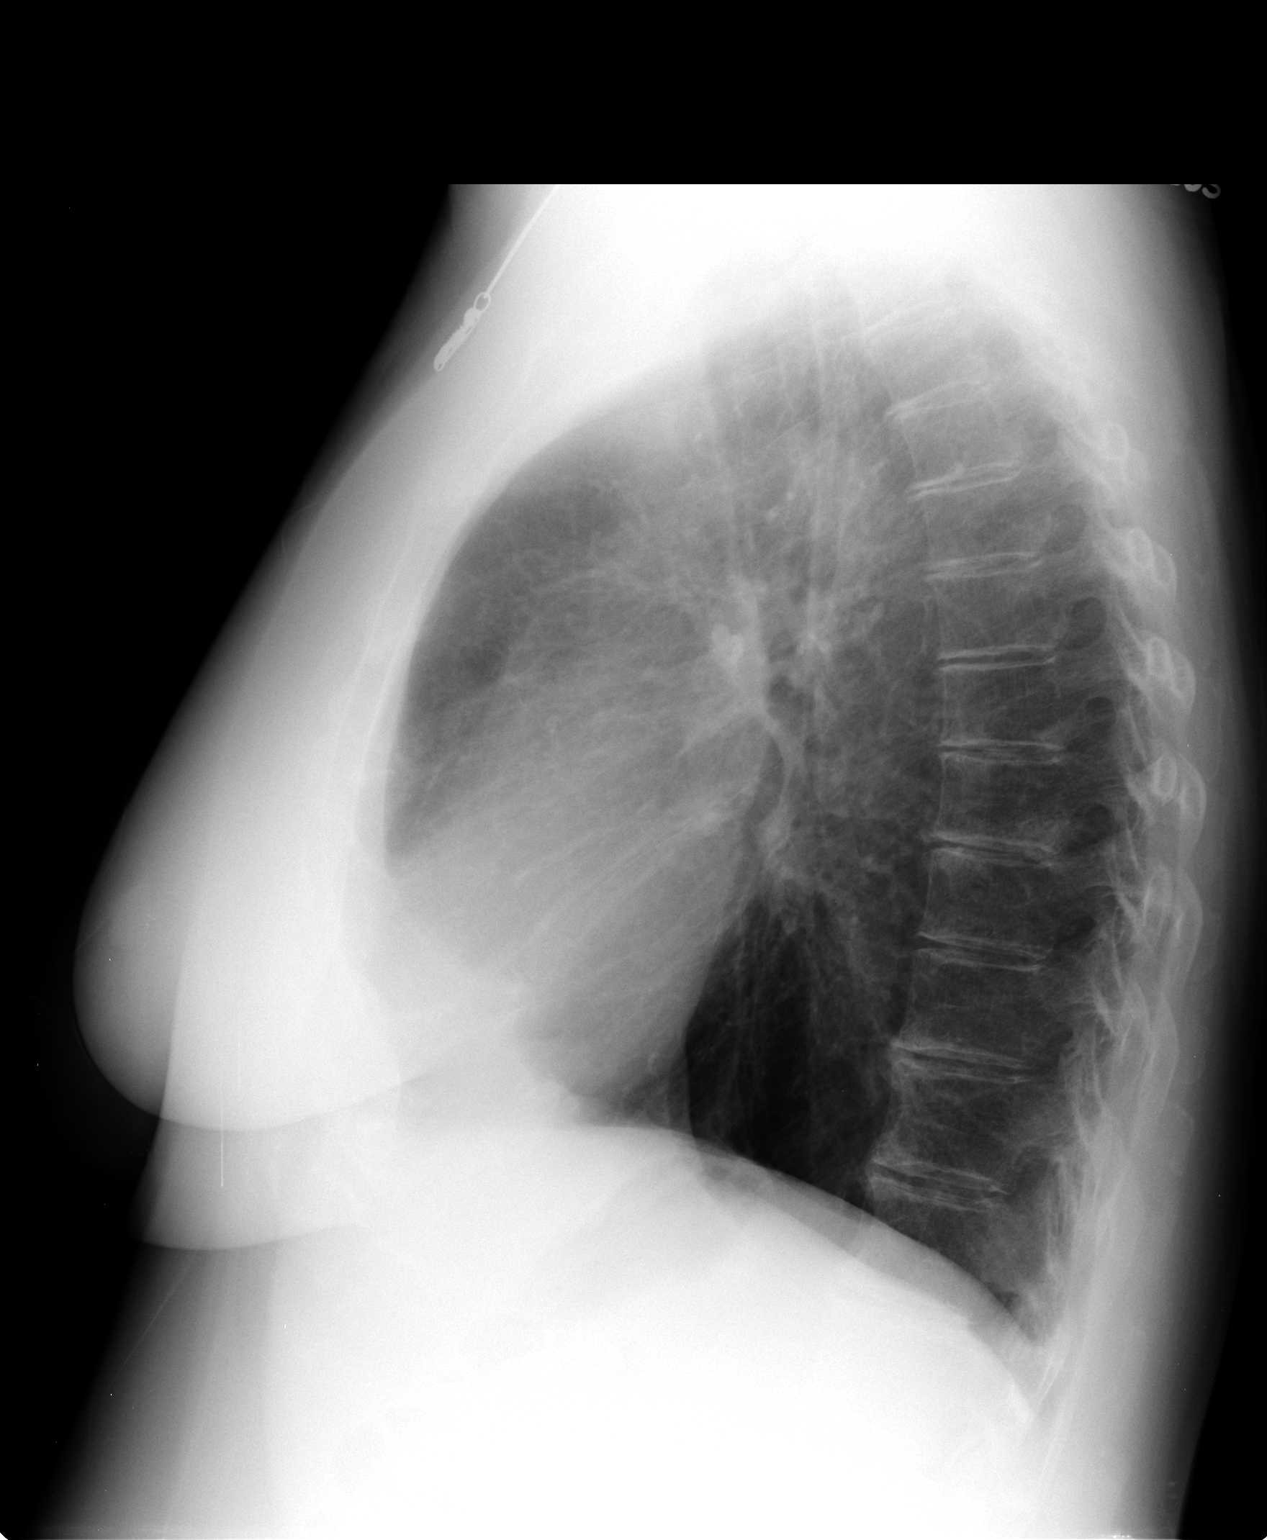

[2 of 2 positions shown; findings below may reference images not displayed]

FINDINGS: Heart size is normal.

There is no pleural effusion or pulmonary edema.

The lungs are hyperinflated and there are coarsened interstitial
markings noted bilaterally.

No airspace consolidation identified.
IMPRESSION: 1.  No acute cardiopulmonary abnormalities.
2.  COPD

## 2012-06-29 ENCOUNTER — Telehealth: Payer: Self-pay | Admitting: Pulmonary Disease

## 2012-06-29 NOTE — Telephone Encounter (Signed)
I spoke with pt and she stated she was confused about what she needed refilled. She forgot she was changed to diltiazem. She did not need refill on this medication. She needed nothing further

## 2012-07-18 ENCOUNTER — Telehealth: Payer: Self-pay | Admitting: Obstetrics and Gynecology

## 2012-07-18 NOTE — Telephone Encounter (Signed)
Entered in error

## 2012-08-12 ENCOUNTER — Other Ambulatory Visit: Payer: Self-pay | Admitting: Pulmonary Disease

## 2012-08-30 ENCOUNTER — Ambulatory Visit (INDEPENDENT_AMBULATORY_CARE_PROVIDER_SITE_OTHER): Payer: 59 | Admitting: Pulmonary Disease

## 2012-08-30 ENCOUNTER — Ambulatory Visit (INDEPENDENT_AMBULATORY_CARE_PROVIDER_SITE_OTHER)
Admission: RE | Admit: 2012-08-30 | Discharge: 2012-08-30 | Disposition: A | Discharge status: Home / Self Care | Payer: 59 | Source: Ambulatory Visit | Attending: Pulmonary Disease | Admitting: Pulmonary Disease

## 2012-08-30 ENCOUNTER — Other Ambulatory Visit (INDEPENDENT_AMBULATORY_CARE_PROVIDER_SITE_OTHER): Payer: 59

## 2012-08-30 ENCOUNTER — Encounter: Payer: Self-pay | Admitting: Pulmonary Disease

## 2012-08-30 VITALS — BP 130/90 | HR 125 | Temp 98.0°F | Ht 62.0 in | Wt 172.0 lb

## 2012-08-30 DIAGNOSIS — F411 Generalized anxiety disorder: Secondary | ICD-10-CM

## 2012-08-30 DIAGNOSIS — E7800 Pure hypercholesterolemia, unspecified: Secondary | ICD-10-CM

## 2012-08-30 DIAGNOSIS — J45909 Unspecified asthma, uncomplicated: Secondary | ICD-10-CM

## 2012-08-30 DIAGNOSIS — R7309 Other abnormal glucose: Secondary | ICD-10-CM

## 2012-08-30 DIAGNOSIS — J449 Chronic obstructive pulmonary disease, unspecified: Secondary | ICD-10-CM

## 2012-08-30 DIAGNOSIS — Z8679 Personal history of other diseases of the circulatory system: Secondary | ICD-10-CM

## 2012-08-30 DIAGNOSIS — I10 Essential (primary) hypertension: Secondary | ICD-10-CM

## 2012-08-30 DIAGNOSIS — J4489 Other specified chronic obstructive pulmonary disease: Secondary | ICD-10-CM

## 2012-08-30 DIAGNOSIS — M545 Low back pain, unspecified: Secondary | ICD-10-CM

## 2012-08-30 DIAGNOSIS — Z8709 Personal history of other diseases of the respiratory system: Secondary | ICD-10-CM

## 2012-08-30 DIAGNOSIS — I872 Venous insufficiency (chronic) (peripheral): Secondary | ICD-10-CM

## 2012-08-30 LAB — BASIC METABOLIC PANEL
BUN: 14 mg/dL (ref 6–23)
CO2: 28 mEq/L (ref 19–32)
Chloride: 98 mEq/L (ref 96–112)
Glucose, Bld: 108 mg/dL — ABNORMAL HIGH (ref 70–99)
Potassium: 4.2 mEq/L (ref 3.5–5.1)

## 2012-08-30 LAB — HEPATIC FUNCTION PANEL
ALT: 50 U/L — ABNORMAL HIGH (ref 0–35)
AST: 37 U/L (ref 0–37)
Total Bilirubin: 1 mg/dL (ref 0.3–1.2)

## 2012-08-30 LAB — CBC WITH DIFFERENTIAL/PLATELET
Eosinophils Absolute: 0.1 10*3/uL (ref 0.0–0.7)
Eosinophils Relative: 1 % (ref 0.0–5.0)
MCV: 91.6 fl (ref 78.0–100.0)
Monocytes Absolute: 0.8 10*3/uL (ref 0.1–1.0)
Neutrophils Relative %: 78 % — ABNORMAL HIGH (ref 43.0–77.0)
Platelets: 220 10*3/uL (ref 150.0–400.0)
WBC: 8.5 10*3/uL (ref 4.5–10.5)

## 2012-08-30 LAB — HEMOGLOBIN A1C: Hgb A1c MFr Bld: 5.7 % (ref 4.6–6.5)

## 2012-08-30 LAB — LIPID PANEL: VLDL: 27.4 mg/dL (ref 0.0–40.0)

## 2012-08-30 LAB — TSH: TSH: 3.38 u[IU]/mL (ref 0.35–5.50)

## 2012-08-30 MED ORDER — TIOTROPIUM BROMIDE MONOHYDRATE 18 MCG IN CAPS: 18.0000 ug | ORAL_CAPSULE | Freq: Every day | RESPIRATORY_TRACT | Status: DC

## 2012-08-30 MED ORDER — FUROSEMIDE 20 MG PO TABS: 20.0000 mg | ORAL_TABLET | Freq: Every day | ORAL | Status: DC

## 2012-08-30 NOTE — Patient Instructions (Addendum)
Today we updated your med list in our EPIC system...    Continue your current medications the same...    We refilled your meds per request...  Today we did your follow up CXR & FASTING blood work...    We will contact you w/ the results when avail...  Keep up the good work w/ diet & exercise...  Call for any questions...  Let's plan a routine follow up in 6 months.Marland KitchenMarland Kitchen

## 2012-08-30 NOTE — Progress Notes (Signed)
Subjective:    Patient ID: [name], female    DOB: Feb 20, 1946, 67 y.o.   MRN: [id number]  HPI 67 y/o WF here for a follow up visit & review of her mult med problems... SEE PREV EPIC NOTES FOR EARLIER DATA>>  ~  September 15, 2011:  65mo ROV & Post Hosp check>>  NOTE: extensive hosp records reviewed, MED reconciliation betw prev meds- hosp meds- rehab disch meds- completed; 65% of visit spent on counseling & review...    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 Interesting that she does not remember anything from the time she called 911, until she awoke in the hosp a day or so after the ETT was removed...    >We reconciled all her meds from our prev Outpt regimen to the [facility] meds & finally the disch meds 1/14 when sent home from rehab: SEE BELOW>>  Today she has bilat rhonchi & exp wheezing on exam- O2sat 94% on RA rest;   CXR today shows hyperinflation & sl incr interstitial lung markings, NAD (markings are reduced from recent hosp films)...  PFT shows FVC=1.82 (65%), FEV1=1.00 (46%), %1sec=55, mid-flows=17%predicted >> c/w mod severe obstructive defect & ?superimposed restriction as well...  LABS reveal  CBC- wnl, Chems- ok x K=3.1, BS=109 & A1c=6.4.Marland KitchenMarland Kitchen REC> PREDNISONE 10mg /d==> slow taper to 5mg Qod by ret  visit;  ADVAIR 250Bid;  SPIRIVA daily: Proventil HFA rescue prn; MUCINEX 2Bid w/ fluids; CARDIZEM CD 120mg  Bid; PEPCID 20mg  Bid; & KCL cap Bid...  ~  October 14, 2011:  65mo ROV & [name] states that she has been doing well & she is w/o complaints today; notes breathing good & she feels that she is back to baseline "I'm fine"; she tells me she was disch from Rehab- improved... We discussed her ret to work> ok for return 10/15/11...  Currently Pred is at 5mg  Qod, Advair250Bid, Spririva daily, Proair HFA as needed & she does the Mucinex prn as well...  She requests refills for Cardizem & Pepcid...  ~  December 15, 2011:  65mo ROV & [name] has had an emotional roller coaster> tried to ret to work 10/15/11 but they had elim her job & has retained a Clinical research associate but doesn't know what to do; boyfriend Homero Fellers lives in [place] & she doesn't want to rush decisions; her breathing has been good- weaned off the Pred & taking Advair/ Spiriva regularly & hasn't needed the AlbutMDI; notes some swelling in ankles related to her underlying VI & we discussed no salt, elevation, support hose... 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    We reviewed prob list, meds, xrays and labs> see below for updates >>  ~  August 30, 2012:  65mo ROV & [name] reports a good interval w/o new complaints or concerns;  Breathing is good on her Advair, Spiriva, Ventolin & she is off the Pred;  She denies any recent resp infection;  BP controlled on DiltiazemCD120Bid + Lasix 20 & measures 130/90 today;  She denies CP, palpit, edema...     We reviewed prob  list, meds, xrays and labs> see below for updates >> she had the 2013 flu vaccine 10/13; requests refills today... 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 LABS 1/14:  FLP- not at goals w/ TChol 240 LDL=181(needs meds & rec Atorva20);  Chems- wnl & LFTs improved;  CBC- wnl;  TSH=3.38;  A1c=5.7.Marland KitchenMarland Kitchen          Problem List:       Hx of ASTHMA, MODERATE >> COPD w/ Hx Acute Resp Failure 1/13 >> now on ADVAIR 250Bid, SPIRIVA daily, MUCINEX 2Bid, Fluids, PROAIR rescue... She is an ex-smoker, quit 2002> hx mod asthma w/ occas exac & mult triggers, ? of VCD in the past; she had been taking ADVAIR 250Bid and PROAIR rescue- reports no asthma attacks in several yrs; she had trip to [place] and went to [place] in the summer of 2009 without too much difficulty... ~  5/11: states good yr, no problems from her Asthma- denies cough, sputum, hemoptysis, worsening dyspnea, wheezing, chest pains, snoring, daytime hypersomnolence, etc... CXR 5/11 showed chr changes/ COPD, NAD.Marland Kitchen. ~  8/12: mild URI, COPD exac> treated w/ ZPak, Pred, Mucinex, etc... 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 ~  1/13 office f/u> CXR shows hyperinflation & sl incr interstitial lung markings, NAD (markings are reduced from recent hosp films)... PFT shows FVC=1.82 (65%), FEV1=1.00 (46%), %1sec=55, mid-flows=17%predicted >> c/w mod severe obstructive defect & ?superimposed restriction as well... Rec slow Pred taper form her 10mg /d... ~  2/13:  On Pred 5mg  Qod & doing well> we discussed weaning off this med & continue others the same; CXR showed normal heart size, sl hyperinflation & incr interstitial markings, NAD... ~  4/13:  Pt weaned off Pred & stable on Advair250 & Spiriva; chest is clear, moving air well... ~  9/13:  She reports a great trip to [place]- walking, stairs, etc; ret w/ URI- cough,  congestion & Rx w/ ZPak/ MedrolDosepak, Mucinex... 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  HYPERTENSION (ICD-401.9) - hx mod HBP that was well controlled on LosartanHCT up until meds changed 1/13 Hosp... ~  EKG 5/11 showed NSR, WNL.Marland Kitchen. ~  5/11:  we discussed the importance of no salt, get weight down, take med every day, & check BP's often at home, at work, etc (she will call if BP> 150/90). ~  8/12: BP 138/84, she has sl HA & fatigue, but denies visual changes, CP, palipit, dizziness, syncope, dyspnea, edema, etc... ~  1/13:  BP= 112/72 post hosp f/u visit on Cardizem90Q6H & HCT12.5/d; REC> stop HCT, & change CardizemCD 120mg Bid. ~  2/13:  BP= 150/80 on Diltiazem 120mg  Bid & K10Bid; continue same & better low sodium diet... ~  4/13:  BP= 136/84 & she is mostly asymptomatic... ~  9/13:  BP= 160/90 on Cardizem120Bid; rec to restrict Sodium, add LASIX 20mg /d... ~  1/14:  BP= 130/90 on same meds & she denies HA, CP, palpit, SOB, edema, etc...  Paroxysmal ATRIAL FIBRILLATION >> this occurred during her hosp 1/13 w/ resp failure; she converted to NSR w/ Cardizem drip & has maintained NSR on Cardizem orally; denies CP/ palpit/ dizzy/ SOB, etc...  VENOUS INSUFFICIENCY >> she has mod VI & trace edema; we discussed no salt, elevate legs, wear support hose, continue Lasix20, etc...  HYPERCHOLESTEROLEMIA (ICD-272.0) - she tried  to control Chol w/ oatmeal, cheerios, and fish oil; finally agreed to start Simvastatin 40mg /d in Aug09, but couldn't remember to take it half the time since it was at night... she has agreed to try Crestor Qam>  ~  FLP 4/08 showed TChol 241, TG 126, HDL 61, LDL 155... rec to start Simvastatin, but chose diet Rx. ~  FLP 8/09 showed TChol 252, TG 96, HDL 57, LDL 180... she agreed to start Simva40. ~  FLP 1/10 showed TChol 213, TG 72, HDL 50, LDL 150... rec to take med more regularly. ~  FLP 5/11 showed TChol 234, TG 74, HDL 68, LDL 156... admits  to not taking Simva40Qpm, ch to Crestor10Qam. ~  She never took the Crestor & declines med rx, lipid clinic, or other interventions; she is content to manage this prob on diet alone. ~  4/13:  It has been 4yrs since she's had fasting labs; asked to get on her best diet & lifestyle change for FLP on ret... ~  FLP 1/14 on diet alone showed TChol 240, TG 137, HDL 50, LDL 181... Rec to start ATORVA20...  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 eval from DrGioffre and Alusio in the past... ~  4/13:  She notes that her back is ok...  Hx CRITICAL ILLNESS MYOPATHY >> occurred after her 1/13 admission for Asthma/COPD exac w/ resp failure on vent x5d==> to rehab & eventually disch w/ outpt rehab on-going... ~  4/13:  She feels back to baseline, did some yard work, mowing, gardening, etc...  S/P left thumb CMC joint arthroplasty by DrGramig 4/12...  ANXIETY (ICD-300.00)   Past Surgical History  Procedure Date  . Tonsillectomy and adnoidectomy   . Cholecystectomy   . Left thumb cmc arthroplasty 11/2010    by DrGramig    Outpatient Encounter Prescriptions as of 08/30/2012  Medication Sig Dispense Refill  . ADVAIR DISKUS 250-50 MCG/DOSE AEPB INHALE 1 PUFF INTO THE LUNGS 2 (TWO) TIMES DAILY.  180 each  3  . diltiazem (CARDIZEM CD) 120 MG 24 hr capsule TAKE 1 CAPSULE BY MOUTH TWICE DAILY  60 capsule  5  . famotidine (PEPCID) 20 MG tablet Take 1 tablet (20 mg total) by mouth 2 (two) times daily.  60 tablet  5  . furosemide (LASIX) 20 MG tablet Take 1 tablet (20 mg total) by mouth daily.  30 tablet  11  . potassium chloride (K-DUR) 10 MEQ tablet Take 1 tablet (10 mEq total) by mouth 2 (two) times daily.  60 tablet  11  . predniSONE (DELTASONE) 10 MG tablet Take as directed  50 tablet  0  . SPIRIVA HANDIHALER 18 MCG inhalation capsule INHALE THE CONTENTS OF 1 CAPSULE BY MOUTH ONCE DAILY  30 capsule  6  . VENTOLIN HFA 108 (90 BASE) MCG/ACT inhaler INHALE 2 PUFFS INTO THE LUNGS EVERY 6 (SIX)  HOURS AS NEEDED FOR WHEEZING.  54 each  4  . albuterol (PROVENTIL HFA;VENTOLIN HFA) 108 (90 BASE) MCG/ACT inhaler Inhale 2 puffs into the lungs every 6 (six) hours as needed. For shortness of breath       . [DISCONTINUED] Fluticasone-Salmeterol (ADVAIR) 250-50 MCG/DOSE AEPB Inhale 1 puff into the lungs 2 (two) times daily.          Allergies  Allergen Reactions  . Codeine     REACTION: nausea    Current Medications, Allergies, Past Medical History, Past Surgical History, Family History, and Social History were reviewed in [employer] record.    Review of Systems        The patient denies fever, chills, sweats, anorexia, fatigue, weakness, malaise, weight loss, sleep disorder, blurring, diplopia, eye irritation, eye discharge, vision loss, eye pain, photophobia, earache, ear discharge, tinnitus, decreased hearing, nasal congestion, nosebleeds, sore throat, hoarseness, chest pain, palpitations, syncope, dyspnea on exertion, orthopnea, PND, peripheral edema, cough, dyspnea at rest, excessive sputum, hemoptysis, wheezing, pleurisy, nausea, vomiting, diarrhea, constipation, change in bowel habits, abdominal pain, melena, hematochezia, jaundice, gas/bloating, indigestion/heartburn, dysphagia, odynophagia, dysuria, hematuria, urinary frequency, urinary hesitancy, nocturia, incontinence, back pain, joint pain, joint swelling, muscle cramps, muscle weakness, stiffness, arthritis, sciatica, restless legs, leg pain at night, leg pain with exertion, rash, itching, dryness, suspicious lesions, paralysis, paresthesias, seizures, tremors, vertigo, transient blindness, frequent falls, frequent headaches, difficulty walking, depression, anxiety, memory loss, confusion, cold intolerance, heat intolerance, polydipsia, polyphagia, polyuria, unusual weight change, abnormal bruising, bleeding, enlarged lymph nodes, urticaria, allergic rash, hay fever, and recurrent infections.     Objective:    Physical Exam    WD, WN, 67 y/o WF in NAD... GENERAL:  Alert & oriented; pleasant & cooperative... HEENT:  [place]/AT, EOM-wnl, PERRLA, EACs-clear, TMs-wnl, NOSE-clear, THROAT-clear & wnl. NECK:  Supple w/ full ROM; no JVD; normal carotid impulses w/o bruits; no thyromegaly or nodules palpated; no lymphadenopathy. CHEST:  Clear to P & A x few scat rhonchi, no wheezing, no consolidation... HEART:  Regular Rhythm; without murmurs/ rubs/ or gallops detected... ABDOMEN:  Soft & nontender; normal bowel sounds; no organomegaly or masses palpated... EXT: without deformities or arthritic changes; no varicose veins/ venous insuffic/ or edema. NEURO:  CN's intact; motor testing normal; sensory testing normal; gait normal & balance OK. DERM:  No lesions noted; no rash etc...  RADIOLOGY DATA:  Reviewed in the EPIC EMR & discussed w/ the patient...  LABORATORY DATA:  Reviewed in the EPIC EMR & discussed w/ the patient...    Assessment & Plan:    COPD w/ Hx RespFailure>> Asthma/ Asthmatic Bronchitis>  Hosp 1/13 w/ resp failure on vent x5d; event disch to rehab, then home w/ PT etc; PFTs confirm mod severe airflow obstruction & may have superimposed restrictive component; CXR w/o acute changes; REC> continue ADVAIR250Bid, SPIRIVA, MUCINEX Bid, Proventil prn==> improved back to baseline & Pred weaned off now ==> stable, continue Rx...  HBP>  Borderline ontrol on diet, no salt, & Diltiazem CD 120mg  Bid + LASIX20... Paroxysmal AFib>  Occurred during the 1/13 [facility] w/ resp failure, bronchodilators, etc;  Treated w/ Adenosine x2 in ER, then Diltiazem==> now CARDIZEM CD 120mg  Bid & stable BP, heart rate & rhythm...  Venous Insuffic>  She has mod VI changes and trace edema in ankles; discussed Rx w/ no salt, elevation, support hose... Add Lasix20...  CHOL>  She has signif hyperchol & we discussed this; she is unconcerned & not phased by my warnings regarding the build up of plaque in her vessels and the risk of  stroke & MI;  She has refused med rx & refused lipid clinic referral; she will do the best she can w/ diet alone; she is asked to return for f/u FLP & she will think about it she says...  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  Hx Critical illness myopathy>  She required a rehab stay in hosp; then disch w/ out pt phys therapy; now back to her baseline she says...  Anxiety>  She has rather moderate anxiety but refuses anxiolytic therapy...   Patient's Medications  New Prescriptions   ATORVASTATIN (LIPITOR) 20 MG TABLET    Take 1 tablet (20 mg total) by mouth daily.  Previous Medications   ADVAIR DISKUS 250-50 MCG/DOSE AEPB    INHALE 1 PUFF INTO THE LUNGS 2 (TWO) TIMES DAILY.   ALBUTEROL (PROVENTIL HFA;VENTOLIN HFA) 108 (90 BASE) MCG/ACT INHALER    Inhale 2 puffs into the lungs every 6 (six) hours as needed. For shortness of breath    DILTIAZEM (CARDIZEM CD) 120 MG 24 HR CAPSULE    TAKE 1 CAPSULE BY MOUTH TWICE DAILY   FAMOTIDINE (PEPCID) 20 MG TABLET    Take 1 tablet (20 mg total) by mouth 2 (two) times daily.   POTASSIUM CHLORIDE (K-DUR) 10 MEQ TABLET    Take 1 tablet (10 mEq total) by mouth 2 (two) times daily.   PREDNISONE (DELTASONE) 10 MG TABLET    Take as directed   VENTOLIN HFA 108 (90 BASE) MCG/ACT INHALER    INHALE 2 PUFFS INTO THE LUNGS EVERY 6 (SIX) HOURS AS NEEDED FOR WHEEZING.  Modified Medications   Modified Medication Previous Medication   FUROSEMIDE (LASIX) 20 MG TABLET furosemide (LASIX) 20 MG tablet      Take 1 tablet (20 mg total) by mouth daily.    Take 1 tablet (20 mg total) by mouth daily.   TIOTROPIUM (SPIRIVA HANDIHALER) 18 MCG INHALATION CAPSULE SPIRIVA HANDIHALER 18 MCG inhalation capsule      Place 1 capsule (18 mcg total) into inhaler and inhale daily.    INHALE THE CONTENTS OF 1 CAPSULE BY MOUTH ONCE DAILY  Discontinued Medications   FLUTICASONE-SALMETEROL (ADVAIR) 250-50 MCG/DOSE AEPB    Inhale 1 puff into the  lungs 2 (two) times daily.

## 2012-09-02 ENCOUNTER — Other Ambulatory Visit: Payer: Self-pay | Admitting: Pulmonary Disease

## 2012-09-02 MED ORDER — ATORVASTATIN CALCIUM 20 MG PO TABS: 20.0000 mg | ORAL_TABLET | Freq: Every day | ORAL | Status: DC

## 2012-09-02 NOTE — Progress Notes (Signed)
Quick Note:  Spoke with pt and notified of results per Dr. Wert. Pt verbalized understanding and denied any questions.  ______ 

## 2012-09-02 NOTE — Progress Notes (Signed)
Quick Note:  Spoke with pt and notified of results per Dr. Kriste Basque. Pt verbalized understanding and denied any questions. Rx for atorvastatin was sent to her pharm ______

## 2013-02-10 ENCOUNTER — Other Ambulatory Visit: Payer: Self-pay | Admitting: Pulmonary Disease

## 2013-02-10 ENCOUNTER — Telehealth: Payer: Self-pay | Admitting: Pulmonary Disease

## 2013-02-10 DIAGNOSIS — I10 Essential (primary) hypertension: Secondary | ICD-10-CM

## 2013-02-10 DIAGNOSIS — E7800 Pure hypercholesterolemia, unspecified: Secondary | ICD-10-CM

## 2013-02-10 NOTE — Telephone Encounter (Signed)
Per SN--  Had full labs on 08/2012  Will need bmp, lip, hepat. These have been placed for the pt and i called and spoke with pt and she is aware.

## 2013-02-10 NOTE — Telephone Encounter (Signed)
Last OV 08/30/12 Pending 02/28/13 Please advise SN thanks

## 2013-02-11 ENCOUNTER — Other Ambulatory Visit (INDEPENDENT_AMBULATORY_CARE_PROVIDER_SITE_OTHER): Payer: 59

## 2013-02-11 DIAGNOSIS — I10 Essential (primary) hypertension: Secondary | ICD-10-CM

## 2013-02-11 DIAGNOSIS — E7800 Pure hypercholesterolemia, unspecified: Secondary | ICD-10-CM

## 2013-02-11 LAB — BASIC METABOLIC PANEL
CO2: 26 mEq/L (ref 19–32)
Chloride: 101 mEq/L (ref 96–112)
Creatinine, Ser: 0.6 mg/dL (ref 0.4–1.2)
Potassium: 3.9 mEq/L (ref 3.5–5.1)

## 2013-02-11 LAB — HEPATIC FUNCTION PANEL
Albumin: 4.2 g/dL (ref 3.5–5.2)
Alkaline Phosphatase: 108 U/L (ref 39–117)

## 2013-02-11 LAB — LIPID PANEL
HDL: 55.5 mg/dL (ref >39.00)
Total CHOL/HDL Ratio: 4
Triglycerides: 181 mg/dL — ABNORMAL HIGH (ref 0.0–149.0)
VLDL: 36.2 mg/dL (ref 0.0–40.0)

## 2013-02-28 ENCOUNTER — Ambulatory Visit: Payer: 59 | Admitting: Pulmonary Disease

## 2013-03-11 ENCOUNTER — Encounter: Payer: Self-pay | Admitting: Pulmonary Disease

## 2013-03-11 ENCOUNTER — Ambulatory Visit (INDEPENDENT_AMBULATORY_CARE_PROVIDER_SITE_OTHER): Payer: 59 | Admitting: Pulmonary Disease

## 2013-03-11 VITALS — BP 154/102 | HR 103 | Temp 98.4°F | Ht 62.0 in | Wt 164.8 lb

## 2013-03-11 DIAGNOSIS — I872 Venous insufficiency (chronic) (peripheral): Secondary | ICD-10-CM

## 2013-03-11 DIAGNOSIS — F411 Generalized anxiety disorder: Secondary | ICD-10-CM

## 2013-03-11 DIAGNOSIS — M545 Low back pain, unspecified: Secondary | ICD-10-CM

## 2013-03-11 DIAGNOSIS — Z8679 Personal history of other diseases of the circulatory system: Secondary | ICD-10-CM

## 2013-03-11 DIAGNOSIS — J45909 Unspecified asthma, uncomplicated: Secondary | ICD-10-CM

## 2013-03-11 DIAGNOSIS — I10 Essential (primary) hypertension: Secondary | ICD-10-CM

## 2013-03-11 DIAGNOSIS — G7281 Critical illness myopathy: Secondary | ICD-10-CM

## 2013-03-11 DIAGNOSIS — J4489 Other specified chronic obstructive pulmonary disease: Secondary | ICD-10-CM

## 2013-03-11 DIAGNOSIS — J449 Chronic obstructive pulmonary disease, unspecified: Secondary | ICD-10-CM

## 2013-03-11 DIAGNOSIS — E7800 Pure hypercholesterolemia, unspecified: Secondary | ICD-10-CM

## 2013-03-11 MED ORDER — ATORVASTATIN CALCIUM 40 MG PO TABS: 40.0000 mg | ORAL_TABLET | Freq: Every day | ORAL | Status: DC

## 2013-03-11 NOTE — Progress Notes (Signed)
Subjective:    Patient ID: [name], female    DOB: July 17, 1946, 67 y.o.   MRN: [id number]  HPI 67 y/o WF here for a follow up visit & review of her mult med problems... SEE PREV EPIC NOTES FOR EARLIER DATA>>  ~  September 15, 2011:  74mo ROV & Post Hosp check>>  NOTE: extensive hosp records reviewed, MED reconciliation betw prev meds- hosp meds- rehab disch meds- completed; 50% of visit spent on counseling & review...    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 Interesting that she does not remember anything from the time she called 911, until she awoke in the hosp a day or so after the ETT was removed...    >We reconciled all her meds from our prev Outpt regimen to the [facility] meds & finally the disch meds 1/14 when sent home from rehab: SEE BELOW>>  Today she has bilat rhonchi & exp wheezing on exam- O2sat 94% on RA rest;   CXR today shows hyperinflation & sl incr interstitial lung markings, NAD (markings are reduced from recent hosp films)...  PFT shows FVC=1.82 (65%), FEV1=1.00 (46%), %1sec=55, mid-flows=17%predicted >> c/w mod severe obstructive defect & ?superimposed restriction as well...  LABS reveal  CBC- wnl, Chems- ok x K=3.1, BS=109 & A1c=6.4.Marland KitchenMarland Kitchen REC> PREDNISONE 10mg /d==> slow taper to 5mg Qod by ret  visit;  ADVAIR 250Bid;  SPIRIVA daily: Proventil HFA rescue prn; MUCINEX 2Bid w/ fluids; CARDIZEM CD 120mg  Bid; PEPCID 20mg  Bid; & KCL cap Bid...  ~  October 14, 2011:  63mo ROV & [name] states that she has been doing well & she is w/o complaints today; notes breathing good & she feels that she is back to baseline "I'm fine"; she tells me she was disch from Rehab- improved... We discussed her ret to work> ok for return 10/15/11...  Currently Pred is at 5mg  Qod, Advair250Bid, Spririva daily, Proair HFA as needed & she does the Mucinex prn as well...  She requests refills for Cardizem & Pepcid...  ~  December 15, 2011:  10mo ROV & [name] has had an emotional roller coaster> tried to ret to work 10/15/11 but they had elim her job & has retained a Clinical research associate but doesn't know what to do; boyfriend Homero Fellers lives in [place] & she doesn't want to rush decisions; her breathing has been good- weaned off the Pred & taking Advair/ Spiriva regularly & hasn't needed the AlbutMDI; notes some swelling in ankles related to her underlying VI & we discussed no salt, elevation, support hose... See prob list below>>  ~  April 30, 2012:  4-810mo ROV recently ret from a big trip to [place]- [place], [employer], [employer]ing [employer] & she notes some SOB but did well & even surprised herself- lots of walking, stairs, etc..Marland Kitchen  She contracted a URI- congestion, ST, cough w/o sput, some incr SOB (on Advair/ Spiriva/ albutHFA & off Pred); also notes some swelling & BP today 160/90 (on Cardiz120Bid)==> we discussed Rx w/ ZPak, Medrol Dosepak, Mucinex; plus LASIX20...    We reviewed prob list, meds, xrays and labs> see below for updates >>  ~  August 30, 2012:  48mo ROV & [name] reports a good interval w/o new complaints or concerns;  Breathing is good on her Advair, Spiriva, Ventolin & she is off the Pred;  She denies any recent resp infection;  BP controlled on DiltiazemCD120Bid + Lasix 20 & measures 130/90 today;  She denies CP, palpit, edema...     We reviewed prob  list, meds, xrays and labs> see below for updates >> she had the 2013 flu vaccine 10/13; requests refills today... 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 LABS 1/14:  FLP- not at goals w/ TChol 240 LDL=181(needs meds & rec Atorva20);  Chems- wnl & LFTs improved;  CBC- wnl;  TSH=3.38;  A1c=5.7.Marland KitchenMarland Kitchen  ~  March 11, 2013:  79mo ROV & [name] reports that she's been doing fine, had a good 79mo; she has had a lot of travel- to [address], [place], [place]... We reviewed the following medical problems during today's office visit >>     Asthma/ COPD> on Advair250, Spiriva, ProventilHFA; she remains off Pred- infreq uses the rescue inhaler; breathing has been good w/o cough/ sput/ wheezing/ SOB/ etc...    HBP> on CardizemCD120Bid, Lasix20, K10Bid; BP= 154/102 today but she has not taken her meds today & BPs all <140/80s at home; she denies CP, palpit, SOB, edema...    PAFib> only one occurrence 1/13 in hosp w/ resp failure and converted on Cardizem drip & no prob since then...    Right CBruit> needs to start ASA81/d; no cerebral ischemic symptoms; she will need CDoppler but wants to wait...    VenInsuffic> mod VI & hx trace edema; we reviewed no salt, elev legs, TEDs, Lasix20...    Chol> on Lip20 started 1/14; FLP 6/14 showed TChol 235, TG 181, HDL 56, LDL 152... rec to incr Atorva40...    Ortho> Hx LBP & s/p left thumb joint arthroplasty> doing satis on OTC anti-inflamm meds;     Hx critical illness myopathy> this occurred w/ her critical illness (acute resp failure) 1/13 & she slowly improved w/ PT & time...    Anxiety> aware- she is doing satis off anxiolytics etc... We reviewed prob list, meds, xrays and labs> see below for updates >>  LABS 6/14:  FLP- not at goals on Lip20;  Chems- wnl x BS=128...          Problem List:       Hx of ASTHMA, MODERATE >> COPD w/ Hx Acute Resp Failure 1/13 >> now on ADVAIR 250Bid, SPIRIVA daily, MUCINEX 2Bid, Fluids, PROAIR  rescue... She is an ex-smoker, quit 2002> hx mod asthma w/ occas exac & mult triggers, ? of VCD in the past; she had been taking ADVAIR 250Bid and PROAIR rescue- reports no asthma attacks in several yrs; she had trip to [place] and went to [place] in the summer of 2009 without too much difficulty... ~  5/11: states good yr, no problems from her Asthma- denies cough, sputum, hemoptysis, worsening dyspnea, wheezing, chest pains, snoring, daytime hypersomnolence, etc... CXR 5/11 showed chr changes/ COPD, NAD.Marland Kitchen. ~  8/12: mild URI, COPD exac> treated w/ ZPak, Pred, Mucinex, etc... 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 ~  1/13 office f/u> CXR shows hyperinflation & sl incr interstitial lung markings, NAD (markings are reduced from recent hosp films)... PFT shows FVC=1.82 (65%), FEV1=1.00 (46%), %1sec=55, mid-flows=17%predicted >> c/w mod severe obstructive defect & ?superimposed restriction as well... Rec slow Pred taper form her 10mg /d... ~  2/13:  On Pred 5mg  Qod & doing well> we discussed weaning off this med & continue others the same; CXR showed normal heart size, sl hyperinflation & incr interstitial markings, NAD... ~  4/13:  Pt weaned off Pred & stable on Advair250 & Spiriva; chest is clear, moving air well... ~  9/13:  She reports a great trip to [place]- walking, stairs, etc; ret w/ URI- cough, congestion & Rx w/ ZPak/ MedrolDosepak, Mucinex... 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  HYPERTENSION (ICD-401.9) - hx mod HBP that was well controlled on LosartanHCT up until meds changed 1/13 Hosp... ~  EKG 5/11 showed NSR, WNL.Marland Kitchen. ~  5/11:  we discussed the importance of no salt, get weight down, take med every day, & check BP's often at home, at work, etc (she will call if BP> 150/90). ~  8/12: BP 138/84, she has sl HA & fatigue, but denies visual  changes, CP, palipit, dizziness, syncope, dyspnea, edema, etc... ~  1/13:  BP= 112/72 post hosp f/u visit on Cardizem90Q6H & HCT12.5/d; REC> stop HCT, & change CardizemCD 120mg Bid. ~  2/13:  BP= 150/80 on Diltiazem 120mg  Bid & K10Bid; continue same & better low sodium diet... ~  4/13:  BP= 136/84 & she is mostly asymptomatic... ~  9/13:  BP= 160/90 on Cardizem120Bid; rec to restrict Sodium, add LASIX 20mg /d... ~  1/14:  BP= 130/90 on same meds & she denies HA, CP, palpit, SOB, edema, etc... ~  7/14: on CardizemCD120Bid, Lasix20, K10Bid; BP= 154/102 today but she has not taken her meds today & BPs all <140/80s at home; she denies CP, palpit, SOB, edema.  Paroxysmal ATRIAL FIBRILLATION >> this occurred during her hosp 1/13 w/ resp failure; she converted to NSR w/ Cardizem drip & has maintained NSR on Cardizem orally; denies CP/ palpit/ dizzy/ SOB, etc...  VENOUS INSUFFICIENCY >> she has mod VI & trace edema; we discussed no salt, elevate legs, wear support hose, continue Lasix20, etc...  HYPERCHOLESTEROLEMIA (ICD-272.0) - she tried  to control Chol w/ oatmeal, cheerios, and fish oil; finally agreed to start Simvastatin 40mg /d in Aug09, but couldn't remember to take it half the time since it was at night... she has agreed to try Crestor Qam>  ~  FLP 4/08 showed TChol 241, TG 126, HDL 61, LDL 155... rec to start Simvastatin, but chose diet Rx. ~  FLP 8/09 showed TChol 252, TG 96, HDL 57, LDL 180... she agreed to start Simva40. ~  FLP 1/10 showed TChol 213, TG 72, HDL 50, LDL 150... rec to take med more regularly. ~  FLP 5/11 showed TChol 234, TG 74, HDL 68, LDL 156... admits to not taking Simva40Qpm, ch to Crestor10Qam. ~  She never took the Crestor & declines med rx, lipid clinic, or other interventions; she is content to manage this prob on diet alone. ~  4/13:  It has been 31yrs since she's had fasting labs; asked to get on her best diet & lifestyle change for FLP on ret... ~  FLP 1/14 on diet alone  showed TChol 240, TG 137, HDL 50, LDL 181... Rec to start ATORVA20... ~  6/14: on Lip20 started 1/14; FLP 6/14 showed TChol 235, TG 181, HDL 56, LDL 152... rec to incr Atorva40.  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 eval from DrGioffre and Alusio in the past... ~  4/13:  She notes that her back is ok...  Hx CRITICAL ILLNESS MYOPATHY >> occurred after her 1/13 admission for Asthma/COPD exac w/ resp failure on vent x5d==> to rehab & eventually disch w/ outpt rehab on-going... ~  4/13:  She feels back to baseline, did some yard work, mowing, gardening, etc...  S/P left thumb CMC joint arthroplasty by DrGramig 4/12...  ANXIETY (ICD-300.00)   Past Surgical History  Procedure Laterality Date  . Tonsillectomy and adnoidectomy    . Cholecystectomy    . Left thumb cmc arthroplasty  11/2010    by DrGramig    Outpatient Encounter Prescriptions as of 03/11/2013  Medication Sig Dispense Refill  . ADVAIR DISKUS 250-50 MCG/DOSE AEPB INHALE 1 PUFF INTO THE LUNGS 2 (TWO) TIMES DAILY.  180 each  3  . albuterol (PROVENTIL HFA;VENTOLIN HFA) 108 (90 BASE) MCG/ACT inhaler Inhale 2 puffs into the lungs every 6 (six) hours as needed. For shortness of breath       . atorvastatin (LIPITOR) 20 MG tablet Take 1 tablet (20 mg total) by mouth daily.  30 tablet  2  . diltiazem (CARDIZEM CD) 120 MG 24 hr capsule TAKE 1 CAPSULE BY MOUTH TWICE DAILY  60 capsule  5  . furosemide (LASIX) 20 MG tablet Take 1 tablet (20 mg total) by mouth daily.  90 tablet  3  . KLOR-CON M10 10 MEQ tablet TAKE 1 TABLET BY MOUTH TWICE A DAY  60 tablet  11  . predniSONE (DELTASONE) 10 MG tablet Take as directed  50 tablet  0  . tiotropium (SPIRIVA HANDIHALER) 18 MCG inhalation capsule Place 1 capsule (18 mcg total) into inhaler and inhale daily.  90 capsule  3  . VENTOLIN HFA 108 (90 BASE) MCG/ACT inhaler INHALE 2 PUFFS INTO THE LUNGS EVERY 6 (SIX) HOURS AS NEEDED FOR WHEEZING.  54 each  4  . famotidine (PEPCID) 20 MG  tablet Take 1 tablet (20 mg total) by mouth 2 (two) times daily.  60 tablet  5   No facility-administered encounter medications on file as of 03/11/2013.    Allergies  Allergen Reactions  . Codeine     REACTION: nausea    Current Medications, Allergies, Past Medical History, Past Surgical History, Family History, and Social History were reviewed in [employer] record.    Review of Systems        The patient denies fever, chills, sweats, anorexia, fatigue, weakness, malaise, weight loss, sleep disorder, blurring, diplopia, eye irritation, eye discharge, vision loss, eye pain, photophobia, earache, ear discharge, tinnitus, decreased hearing, nasal congestion, nosebleeds, sore throat, hoarseness, chest pain, palpitations, syncope, dyspnea on exertion, orthopnea, PND, peripheral edema, cough, dyspnea at rest, excessive sputum, hemoptysis, wheezing, pleurisy, nausea, vomiting, diarrhea, constipation, change in bowel habits, abdominal pain, melena, hematochezia, jaundice, gas/bloating, indigestion/heartburn, dysphagia, odynophagia, dysuria, hematuria, urinary frequency, urinary hesitancy, nocturia, incontinence, back pain, joint pain, joint swelling, muscle cramps, muscle weakness, stiffness, arthritis, sciatica, restless legs, leg pain at night, leg pain with exertion, rash, itching, dryness, suspicious lesions, paralysis, paresthesias, seizures, tremors, vertigo, transient blindness, frequent falls, frequent headaches, difficulty walking, depression, anxiety, memory  loss, confusion, cold intolerance, heat intolerance, polydipsia, polyphagia, polyuria, unusual weight change, abnormal bruising, bleeding, enlarged lymph nodes, urticaria, allergic rash, hay fever, and recurrent infections.     Objective:   Physical Exam    WD, WN, 67 y/o WF in NAD... GENERAL:  Alert & oriented; pleasant & cooperative... HEENT:  Tiger/AT, EOM-wnl, PERRLA, EACs-clear, TMs-wnl, NOSE-clear,  THROAT-clear & wnl. NECK:  Supple w/ full ROM; no JVD; normal carotid impulses w/ right bruit; no thyromegaly or nodules palpated; no lymphadenopathy. CHEST:  Clear to P & A x few scat rhonchi, no wheezing, no consolidation... HEART:  Regular Rhythm; without murmurs/ rubs/ or gallops detected... ABDOMEN:  Soft & nontender; normal bowel sounds; no organomegaly or masses palpated... EXT: without deformities or arthritic changes; no varicose veins/ venous insuffic/ or edema. NEURO:  CN's intact; motor testing normal; sensory testing normal; gait normal & balance OK. DERM:  No lesions noted; no rash etc...  RADIOLOGY DATA:  Reviewed in the EPIC EMR & discussed w/ the patient...  LABORATORY DATA:  Reviewed in the EPIC EMR & discussed w/ the patient...    Assessment & Plan:    COPD w/ Hx RespFailure>> Asthma/ Asthmatic Bronchitis>  Hosp 1/13 w/ resp failure on vent x5d; event disch to rehab, then home w/ PT etc; PFTs confirm mod severe airflow obstruction & may have superimposed restrictive component; CXR w/o acute changes; REC> continue ADVAIR250Bid, SPIRIVA, MUCINEX Bid, Proventil prn==> improved back to baseline & Pred weaned off now ==> stable, continue Rx...  HBP>  Borderline ontrol on diet, no salt, & Diltiazem CD 120mg  Bid + LASIX20... Paroxysmal AFib>  Occurred during the 1/13 [facility] w/ resp failure, bronchodilators, etc;  Treated w/ Adenosine x2 in ER, then Diltiazem==> now CARDIZEM CD 120mg  Bid & stable BP, heart rate & rhythm... R CBruit> rec to take ASA81 & needs CDopplers...  Venous Insuffic>  She has mod VI changes and trace edema in ankles; discussed Rx w/ no salt, elevation, support hose... Add Lasix20...  CHOL>  FLP is sl improved on Atorva20 but needs incr to Atorva40...  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  Hx Critical illness myopathy>  She required a rehab stay in hosp; then disch w/ out pt phys therapy; now back to her  baseline she says...  Anxiety>  She has rather moderate anxiety but refuses anxiolytic therapy...   Patient's Medications  New Prescriptions   ATORVASTATIN (LIPITOR) 40 MG TABLET    Take 1 tablet (40 mg total) by mouth daily.  Previous Medications   ADVAIR DISKUS 250-50 MCG/DOSE AEPB    INHALE 1 PUFF INTO THE LUNGS 2 (TWO) TIMES DAILY.   ALBUTEROL (PROVENTIL HFA;VENTOLIN HFA) 108 (90 BASE) MCG/ACT INHALER    Inhale 2 puffs into the lungs every 6 (six) hours as needed. For shortness of breath    ASPIRIN EC 81 MG TABLET    Take 81 mg by mouth daily.   DILTIAZEM (CARDIZEM CD) 120 MG 24 HR CAPSULE    TAKE 1 CAPSULE BY MOUTH TWICE DAILY   FAMOTIDINE (PEPCID) 20 MG TABLET    Take 1 tablet (20 mg total) by mouth 2 (two) times daily.   FUROSEMIDE (LASIX) 20 MG TABLET    Take 1 tablet (20 mg total) by mouth daily.   KLOR-CON M10 10 MEQ TABLET    TAKE 1 TABLET BY MOUTH TWICE A DAY   TIOTROPIUM (SPIRIVA HANDIHALER) 18 MCG INHALATION CAPSULE  Place 1 capsule (18 mcg total) into inhaler and inhale daily.   VENTOLIN HFA 108 (90 BASE) MCG/ACT INHALER    INHALE 2 PUFFS INTO THE LUNGS EVERY 6 (SIX) HOURS AS NEEDED FOR WHEEZING.  Modified Medications   No medications on file  Discontinued Medications   ATORVASTATIN (LIPITOR) 20 MG TABLET    Take 1 tablet (20 mg total) by mouth daily.   PREDNISONE (DELTASONE) 10 MG TABLET    Take as directed

## 2013-03-11 NOTE — Patient Instructions (Addendum)
Today we updated your med list in our EPIC system...    Continue your current medications the same...  We decided to increase your ATORVASTATIN to 40mg /d...  We also added a buffered or caoted baby aspirin daily (81mg )...  Be sure to take your BP meds regularly & monitor your BP at home...  Call for any questions...  Let's plan a follow up visit in 55mo w/ FASTING blood work recheck at that time.Marland KitchenMarland Kitchen

## 2013-08-11 ENCOUNTER — Other Ambulatory Visit: Payer: Self-pay | Admitting: Pulmonary Disease

## 2013-09-14 ENCOUNTER — Ambulatory Visit (INDEPENDENT_AMBULATORY_CARE_PROVIDER_SITE_OTHER): Payer: Medicare Other | Admitting: Pulmonary Disease

## 2013-09-14 ENCOUNTER — Encounter: Payer: Self-pay | Admitting: Pulmonary Disease

## 2013-09-14 ENCOUNTER — Encounter (INDEPENDENT_AMBULATORY_CARE_PROVIDER_SITE_OTHER): Payer: Self-pay

## 2013-09-14 ENCOUNTER — Other Ambulatory Visit (INDEPENDENT_AMBULATORY_CARE_PROVIDER_SITE_OTHER): Payer: Medicare Other

## 2013-09-14 VITALS — BP 136/92 | HR 116 | Temp 99.1°F | Ht 62.0 in | Wt 168.6 lb

## 2013-09-14 DIAGNOSIS — I872 Venous insufficiency (chronic) (peripheral): Secondary | ICD-10-CM

## 2013-09-14 DIAGNOSIS — Z8679 Personal history of other diseases of the circulatory system: Secondary | ICD-10-CM

## 2013-09-14 DIAGNOSIS — I10 Essential (primary) hypertension: Secondary | ICD-10-CM

## 2013-09-14 DIAGNOSIS — F411 Generalized anxiety disorder: Secondary | ICD-10-CM

## 2013-09-14 DIAGNOSIS — M545 Low back pain, unspecified: Secondary | ICD-10-CM

## 2013-09-14 DIAGNOSIS — E7800 Pure hypercholesterolemia, unspecified: Secondary | ICD-10-CM

## 2013-09-14 DIAGNOSIS — R21 Rash and other nonspecific skin eruption: Secondary | ICD-10-CM

## 2013-09-14 DIAGNOSIS — J4489 Other specified chronic obstructive pulmonary disease: Secondary | ICD-10-CM

## 2013-09-14 DIAGNOSIS — J449 Chronic obstructive pulmonary disease, unspecified: Secondary | ICD-10-CM

## 2013-09-14 LAB — LIPID PANEL
CHOLESTEROL: 146 mg/dL (ref 0–200)
HDL: 55.7 mg/dL (ref >39.00)
LDL Cholesterol: 63 mg/dL (ref 0–99)
Total CHOL/HDL Ratio: 3
Triglycerides: 136 mg/dL (ref 0.0–149.0)
VLDL: 27.2 mg/dL (ref 0.0–40.0)

## 2013-09-14 LAB — CBC WITH DIFFERENTIAL/PLATELET
BASOS PCT: 0.3 % (ref 0.0–3.0)
Basophils Absolute: 0 10*3/uL (ref 0.0–0.1)
EOS PCT: 1.9 % (ref 0.0–5.0)
Eosinophils Absolute: 0.1 10*3/uL (ref 0.0–0.7)
HEMATOCRIT: 44.4 % (ref 36.0–46.0)
HEMOGLOBIN: 15 g/dL (ref 12.0–15.0)
LYMPHS ABS: 0.7 10*3/uL (ref 0.7–4.0)
Lymphocytes Relative: 9.4 % — ABNORMAL LOW (ref 12.0–46.0)
MCHC: 33.7 g/dL (ref 30.0–36.0)
MCV: 89.1 fl (ref 78.0–100.0)
MONO ABS: 0.5 10*3/uL (ref 0.1–1.0)
MONOS PCT: 6.2 % (ref 3.0–12.0)
NEUTROS ABS: 6.2 10*3/uL (ref 1.4–7.7)
Neutrophils Relative %: 82.2 % — ABNORMAL HIGH (ref 43.0–77.0)
Platelets: 244 10*3/uL (ref 150.0–400.0)
RBC: 4.98 Mil/uL (ref 3.87–5.11)
RDW: 13.9 % (ref 11.5–14.6)
WBC: 7.5 10*3/uL (ref 4.5–10.5)

## 2013-09-14 LAB — HEPATIC FUNCTION PANEL
ALBUMIN: 4.4 g/dL (ref 3.5–5.2)
ALT: 26 U/L (ref 0–35)
AST: 21 U/L (ref 0–37)
Alkaline Phosphatase: 137 U/L — ABNORMAL HIGH (ref 39–117)
BILIRUBIN TOTAL: 0.8 mg/dL (ref 0.3–1.2)
Bilirubin, Direct: 0.1 mg/dL (ref 0.0–0.3)
Total Protein: 7.9 g/dL (ref 6.0–8.3)

## 2013-09-14 LAB — BASIC METABOLIC PANEL
BUN: 10 mg/dL (ref 6–23)
CALCIUM: 9.2 mg/dL (ref 8.4–10.5)
CO2: 29 mEq/L (ref 19–32)
Chloride: 100 mEq/L (ref 96–112)
Creatinine, Ser: 0.7 mg/dL (ref 0.4–1.2)
GFR: 92.19 mL/min (ref >60.00)
Glucose, Bld: 114 mg/dL — ABNORMAL HIGH (ref 70–99)
Potassium: 3.6 mEq/L (ref 3.5–5.1)
Sodium: 136 mEq/L (ref 135–145)

## 2013-09-14 LAB — HEMOGLOBIN A1C: Hgb A1c MFr Bld: 6 % (ref 4.6–6.5)

## 2013-09-14 LAB — TSH: TSH: 2.23 u[IU]/mL (ref 0.35–5.50)

## 2013-09-14 MED ORDER — FLUTICASONE-SALMETEROL 250-50 MCG/DOSE IN AEPB: INHALATION_SPRAY | RESPIRATORY_TRACT | Status: AC

## 2013-09-14 MED ORDER — POTASSIUM CHLORIDE CRYS ER 10 MEQ PO TBCR: EXTENDED_RELEASE_TABLET | ORAL | Status: AC

## 2013-09-14 MED ORDER — ALBUTEROL SULFATE HFA 108 (90 BASE) MCG/ACT IN AERS: INHALATION_SPRAY | RESPIRATORY_TRACT | Status: AC

## 2013-09-14 MED ORDER — DILTIAZEM HCL ER COATED BEADS 120 MG PO CP24: ORAL_CAPSULE | ORAL | Status: AC

## 2013-09-14 MED ORDER — FUROSEMIDE 20 MG PO TABS: 20.0000 mg | ORAL_TABLET | Freq: Every day | ORAL | Status: DC

## 2013-09-14 MED ORDER — TIOTROPIUM BROMIDE MONOHYDRATE 18 MCG IN CAPS: 18.0000 ug | ORAL_CAPSULE | Freq: Every day | RESPIRATORY_TRACT | Status: DC

## 2013-09-14 MED ORDER — FAMOTIDINE 20 MG PO TABS: 20.0000 mg | ORAL_TABLET | Freq: Two times a day (BID) | ORAL | Status: AC

## 2013-09-14 MED ORDER — ATORVASTATIN CALCIUM 40 MG PO TABS: 40.0000 mg | ORAL_TABLET | Freq: Every day | ORAL | Status: AC

## 2013-09-14 NOTE — Patient Instructions (Signed)
Today we updated your med list in our EPIC system...    Continue your current medications the same...    We refilled your meds per request...  Today we did your follow up FASTING blood work...    We will contact you w/ the results when available...   Keep up the good work w/ diet & exercise...  Call for any questions...  Let's plan a follow up visit in 46mo, sooner if needed for problems.Marland Kitchen..Marland Kitchen

## 2013-09-14 NOTE — Progress Notes (Signed)
Subjective:    Patient ID: [name], female    DOB: 31-May-1946, 68 y.o.   MRN: [id number]  HPI 68 y/o WF here for a follow up visit & review of her mult med problems... SEE PREV EPIC NOTES FOR EARLIER DATA>>  ~  September 15, 2011:  25mo ROV & Post Hosp check>>  NOTE: extensive hosp records reviewed, MED reconciliation betw prev meds- hosp meds- rehab disch meds- completed; 50% of visit spent on counseling & review...    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 Interesting that she does not remember anything from the time she called 911, until she awoke in the hosp a day or so after the ETT was removed...    >We reconciled all her meds from our prev Outpt regimen to the [facility] meds & finally the disch meds 1/14 when sent home from rehab: SEE BELOW>>  Today she has bilat rhonchi & exp wheezing on exam- O2sat 94% on RA rest;   CXR today shows hyperinflation & sl incr interstitial lung markings, NAD (markings are reduced from recent hosp films)...  PFT shows FVC=1.82 (65%), FEV1=1.00 (46%), %1sec=55, mid-flows=17%predicted >> c/w mod severe obstructive defect & ?superimposed restriction as well...  LABS reveal  CBC- wnl, Chems- ok x K=3.1, BS=109 & A1c=6.4.Marland KitchenMarland Kitchen REC> PREDNISONE $RemoveBeforeD'10mg'zSfTNqcwxHNahm$ /d==> slow taper to $Remove'5mg'xgumOZH$ Qod by ret  visit;  ADVAIR 250Bid;  SPIRIVA daily: Proventil HFA rescue prn; MUCINEX 2Bid w/ fluids; CARDIZEM CD $RemoveBef'120mg'NsssAQAWVf$  Bid; PEPCID $RemoveBef'20mg'NWnwQoRqPs$  Bid; & KCL 98mEq cap Bid...  ~  October 14, 2011:  66mo ROV & [name] states that she has been doing well & she is w/o complaints today; notes breathing good & she feels that she is back to baseline "I'm fine"; she tells me she was disch from Rehab- improved... We discussed her ret to work> ok for return 10/15/11...  Currently Pred is at $Rem'5mg'RfxK$  Qod, Advair250Bid, Spririva daily, Proair HFA as needed & she does the Mucinex prn as well...  She requests refills for Cardizem & Pepcid...  ~  December 15, 2011:  62mo ROV & [name] has had an emotional roller coaster> tried to ret to work 10/15/11 but they had elim her job & has retained a Chief Executive Officer but doesn't know what to do; boyfriend [name] lives in [place] & she doesn't want to rush decisions; her breathing has been good- weaned off the Pred & taking Advair/ Spiriva regularly & hasn't needed the AlbutMDI; notes some swelling in ankles related to her underlying VI & we discussed no salt, elevation, support hose... See prob list below>>  ~  April 30, 2012:  4-32mo ROV recently ret from a big trip to [place]- [place], Dispensing optician, Social research officer, government & she notes some SOB but did well & even surprised herself- lots of walking, stairs, etc..Marland Kitchen  She contracted a URI- congestion, ST, cough w/o sput, some incr SOB (on Advair/ Spiriva/ albutHFA & off Pred); also notes some swelling & BP today 160/90 (on Cardiz120Bid)==> we discussed Rx w/ ZPak, [name] Dosepak, Mucinex; plus LASIX20...    We reviewed prob list, meds, xrays and labs> see below for updates >>  ~  August 30, 2012:  52mo ROV & [name] reports a good interval w/o new complaints or concerns;  Breathing is good on her Advair, Spiriva, Ventolin & she is off the Pred;  She denies any recent resp infection;  BP controlled on DiltiazemCD120Bid + Lasix 20 & measures 130/90 today;  She denies CP, palpit, edema...     We reviewed prob  list, meds, xrays and labs> see below for updates >> she had the 2013 flu vaccine 10/13; requests refills today...  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  LABS 1/14:  FLP- not at goals w/ TChol 240 LDL=181(needs meds & rec Atorva20);  Chems- wnl & LFTs improved;  CBC- wnl;  TSH=3.38;  A1c=5.7.Marland KitchenMarland Kitchen  ~  March 11, 2013:  22mo ROV & [name] reports that she's been doing fine, had a good 23mo; she has had a lot of travel- to [place], [place], [place]... We reviewed the following medical problems during today's office visit >>     Asthma/ COPD> on Advair250, Spiriva, ProventilHFA; she remains off Pred- infreq uses the rescue inhaler; breathing has been good w/o cough/ sput/ wheezing/ SOB/ etc...    HBP> on CardizemCD120Bid, Lasix20, K10Bid; BP= 154/102 today but she has not taken her meds today & BPs all <140/80s at home; she denies CP, palpit, SOB, edema...    PAFib> only one occurrence 1/13 in hosp w/ resp failure and converted on Cardizem drip & no prob since then...    Right CBruit> needs to start ASA81/d; no cerebral ischemic symptoms; she will need CDoppler but wants to wait...    VenInsuffic> mod VI & hx trace edema; we reviewed no salt, elev legs, TEDs, Lasix20...    Chol> on Lip20 started 1/14; [place] 6/14 showed TChol 235, TG 181, HDL 56, LDL 152... rec to [place]...    Ortho> Hx LBP & s/p left thumb joint arthroplasty> doing satis on OTC anti-inflamm meds;     Hx critical illness myopathy> this occurred w/ her critical illness (acute resp failure) 1/13 & she slowly improved w/ PT & time...    Anxiety> aware- she is doing satis off anxiolytics etc... We reviewed prob list, meds, xrays and labs> see below for updates >>   LABS 6/14:  FLP- not at goals on Lip20;  Chems- wnl x BS=128...   ~  September 14, 2013:  25mo ROV & [name] is c/o a rash of 13mo duration, ?etiology- she has tried diff laundry detergents etc; denies itching & we rec that she see Derm for  further eval...     Asthma/ COPD> on Advair250Bid, Spiriva, ProventilHFA prn; she remains off Pred- infreq uses the rescue inhaler; breathing has been good w/o cough/ sput/ wheezing/ SOB/ etc...    HBP> on CardizemCD120Bid, Lasix20, K10Bid; BP= 136/92 today & BPs all <140/80s at home she says; she denies CP, palpit, SOB, edema...    PAFib> only one occurrence 1/13 in hosp w/ resp failure and converted on Cardizem drip & no prob since then...    Right CBruit> on ASA81/d; no cerebral ischemic symptoms; she will need CDoppler but wants to wait.Marland KitchenMarland Kitchen  VenInsuffic> mod VI & hx trace edema; we reviewed no salt, elev legs, TEDs, Lasix20...    Chol> on Lip40; FLP 1/15 showed TChol 146, TG 136, HDL 56, LDL 63... rec to continue same...    Ortho> Hx LBP & s/p left thumb joint arthroplasty> doing satis on OTC anti-inflamm meds;     Hx critical illness myopathy> this occurred w/ her critical illness (acute resp failure) 1/13 & she slowly improved w/ PT & time, back to normal...    Anxiety> aware- she is doing satis off anxiolytics etc... We reviewed prob list, meds, xrays and labs> see below for updates >> she had the 2014 Flu vaccine 10/14; needs refills for all meds today...  LABS 1/15:  FLP- at goals on Lip40;  Chems- wnl;  CBC- wnl;  TSH=2.23...          Problem List:       Hx of ASTHMA, MODERATE >> COPD w/ Hx Acute Resp Failure 1/13 >> now on ADVAIR 250Bid, SPIRIVA daily, MUCINEX 2Bid, Fluids, PROAIR rescue... She is an ex-smoker, quit 2002> hx mod asthma w/ occas exac & mult triggers, ? of VCD in the past; she had been taking ADVAIR 250Bid and PROAIR rescue- reports no asthma attacks in several yrs; she had trip to [place] and went to [place] in the summer of 2009 without too much difficulty... ~  5/11: states good yr, no problems from her Asthma- denies cough, sputum, hemoptysis, worsening dyspnea, wheezing, chest pains, snoring, daytime hypersomnolence, etc... CXR 5/11 showed chr changes/ COPD,  NAD.Marland Kitchen. ~  8/12: mild URI, COPD exac> treated w/ ZPak, Pred, Mucinex, etc... 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 ~  1/13 office f/u> CXR shows hyperinflation & sl incr interstitial lung markings, NAD (markings are reduced from recent hosp films)... PFT shows FVC=1.82 (65%), FEV1=1.00 (46%), %1sec=55, mid-flows=17%predicted >> c/w mod severe obstructive defect & ?superimposed restriction as well... Rec slow Pred taper form her $Remove'10mg'USEKeuJ$ /d... ~  2/13:  On Pred $Remov'5mg'ZUymrs$  Qod & doing well> we discussed weaning off this med & continue others the same; CXR showed normal heart size, sl hyperinflation & incr interstitial markings, NAD... ~  4/13:  Pt weaned off Pred & stable on Advair250 & Spiriva; chest is clear, moving air well... ~  9/13:  She reports a great trip to [place]- walking, stairs, etc; ret w/ URI- cough, congestion & Rx w/ ZPak/ MedrolDosepak, Mucinex... 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  HYPERTENSION (ICD-401.9) - hx mod HBP that was well controlled on LosartanHCT up until meds changed 1/13 Hosp... ~  EKG 5/11 showed NSR, WNL.Marland Kitchen. ~  5/11:  we discussed the importance of no salt, get weight down, take med every day, & check BP's often at home, at work, etc (she will call if BP> 150/90). ~  8/12: BP 138/84, she has sl HA & fatigue, but denies visual changes, CP, palipit, dizziness, syncope, dyspnea, edema, etc... ~  1/13:  BP= 112/72 post hosp f/u visit on Cardizem90Q6H & HCT12.5/d; REC> stop HCT, & change CardizemCD $RemoveBeforeDEI'120mg'VrilvITKqCPFqKot$ Bid. ~  2/13:  BP= 150/80 on [name] $RemoveBefo'120mg'hmGmFlgwAZd$  Bid & K10Bid; continue same & better low sodium diet... ~  4/13:  BP= 136/84 & she is mostly asymptomatic... ~  9/13:  BP= 160/90 on Cardizem120Bid; rec to restrict Sodium, add LASIX $RemoveB'20mg'XNgaIIHy$ /d... ~  1/14:  BP= 130/90 on same meds & she denies HA, CP, palpit, SOB, edema, etc... ~  7/14: on  CardizemCD120Bid, Lasix20, K10Bid; BP= 154/102 today but she has not taken her meds today & BPs all <140/80s at home; she denies CP, palpit, SOB, edema.  Paroxysmal ATRIAL FIBRILLATION >> this occurred during her hosp 1/13 w/ resp failure; she converted to NSR w/ Cardizem drip & has maintained NSR on Cardizem orally; denies CP/ palpit/ dizzy/ SOB, etc...  VENOUS INSUFFICIENCY >> she has mod VI & trace edema; we discussed no salt, elevate legs, wear support hose, continue Lasix20, etc...  HYPERCHOLESTEROLEMIA (ICD-272.0) - she tried  to control Chol w/ oatmeal, cheerios, and fish oil; finally agreed to start Simvastatin $RemoveBeforeDEI'40mg'HcgnlulvbwhuJbCL$ /d in Aug09, but couldn't remember to take it half the time since it was at night... she has agreed to try Crestor Qam>  ~  FLP 4/08 showed TChol 241, TG 126, HDL 61, LDL 155... rec to start Simvastatin, but chose diet Rx. ~  FLP 8/09 showed TChol 252, TG 96, HDL 57, LDL 180... she agreed to start Simva40. ~  FLP 1/10 showed TChol 213, TG 72, HDL 50, LDL 150... rec to take med more regularly. ~  FLP 5/11 showed TChol 234, TG 74, HDL 68, LDL 156... admits to not taking Simva40Qpm, ch to Crestor10Qam. ~  She never took the Crestor & declines med rx, lipid clinic, or other interventions; she is content to manage this prob on diet alone. ~  4/13:  It has been 108yrs since she's had fasting labs; asked to get on her best diet & lifestyle change for FLP on ret... ~  FLP 1/14 on diet alone showed TChol 240, TG 137, HDL 50, LDL 181... Rec to start [place]... ~  6/14: on Lip20 started 1/14; [place] 6/14 showed TChol 235, TG 181, HDL 56, LDL 152... rec to [place].  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 eval from DrGioffre and Alusio in the past... ~  4/13:  She notes that her back is ok...  Hx CRITICAL ILLNESS MYOPATHY >> occurred after her 1/13 admission for Asthma/COPD exac w/ resp failure on vent x5d==> to rehab & eventually disch w/ outpt rehab on-going... ~  4/13:   She feels back to baseline, did some yard work, mowing, gardening, etc...  S/P left thumb CMC joint arthroplasty by DrGramig 4/12...  ANXIETY (ICD-300.00)   Past Surgical History  Procedure Laterality Date  . Tonsillectomy and adnoidectomy    . Cholecystectomy    . Left thumb cmc arthroplasty  11/2010    by DrGramig    Outpatient Encounter Prescriptions as of 09/14/2013  Medication Sig  . ADVAIR DISKUS 250-50 MCG/DOSE AEPB INHALE 1 PUFFS TWICE A DAY  . aspirin EC 81 MG tablet Take 81 mg by mouth daily.  Marland Kitchen atorvastatin (LIPITOR) 40 MG tablet Take 1 tablet (40 mg total) by mouth daily.  Marland Kitchen [name] (CARDIZEM CD) 120 MG 24 hr capsule TAKE 1 CAPSULE BY MOUTH TWICE DAILY  . famotidine (PEPCID) 20 MG tablet Take 1 tablet (20 mg total) by mouth 2 (two) times daily.  . furosemide (LASIX) 20 MG tablet Take 1 tablet (20 mg total) by mouth daily.  Marland Kitchen KLOR-CON M10 10 MEQ tablet TAKE 1 TABLET BY MOUTH TWICE A DAY  . tiotropium (SPIRIVA HANDIHALER) 18 MCG inhalation capsule Place 1 capsule (18 mcg total) into inhaler and inhale daily.  . VENTOLIN HFA 108 (90 BASE) MCG/ACT inhaler INHALE 2 PUFFS INTO  THE LUNGS EVERY 6 (SIX) HOURS AS NEEDED FOR WHEEZING.  . [DISCONTINUED] albuterol (PROVENTIL HFA;VENTOLIN HFA) 108 (90 BASE) MCG/ACT inhaler Inhale 2 puffs into the lungs every 6 (six) hours as needed. For shortness of breath     Allergies  Allergen Reactions  . Codeine     REACTION: nausea    Current Medications, Allergies, Past Medical History, Past Surgical History, Family History, and Social History were reviewed in [employer] record.    Review of Systems        The patient denies fever, chills, sweats, anorexia, fatigue, weakness, malaise, weight loss, sleep disorder, blurring, diplopia, eye irritation, eye discharge, vision loss, eye pain, photophobia, earache, ear discharge, tinnitus, decreased hearing, nasal congestion, nosebleeds, sore throat, hoarseness, chest  pain, palpitations, syncope, dyspnea on exertion, orthopnea, PND, peripheral edema, cough, dyspnea at rest, excessive sputum, hemoptysis, wheezing, pleurisy, nausea, vomiting, diarrhea, constipation, change in bowel habits, abdominal pain, melena, hematochezia, jaundice, gas/bloating, indigestion/heartburn, dysphagia, odynophagia, dysuria, hematuria, urinary frequency, urinary hesitancy, nocturia, incontinence, back pain, joint pain, joint swelling, muscle cramps, muscle weakness, stiffness, arthritis, sciatica, restless legs, leg pain at night, leg pain with exertion, rash, itching, dryness, suspicious lesions, paralysis, paresthesias, seizures, tremors, vertigo, transient blindness, frequent falls, frequent headaches, difficulty walking, depression, anxiety, memory loss, confusion, cold intolerance, heat intolerance, polydipsia, polyphagia, polyuria, unusual weight change, abnormal bruising, bleeding, enlarged lymph nodes, urticaria, allergic rash, hay fever, and recurrent infections.     Objective:   Physical Exam    WD, WN, 68 y/o WF in NAD... GENERAL:  Alert & oriented; pleasant & cooperative... HEENT:  [place]/AT, EOM-wnl, PERRLA, EACs-clear, TMs-wnl, NOSE-clear, THROAT-clear & wnl. NECK:  Supple w/ full ROM; no JVD; normal carotid impulses w/ right bruit; no thyromegaly or nodules palpated; no lymphadenopathy. CHEST:  Clear to P & A x few scat rhonchi, no wheezing, no consolidation... HEART:  Regular Rhythm; without murmurs/ rubs/ or gallops detected... ABDOMEN:  Soft & nontender; normal bowel sounds; no organomegaly or masses palpated... EXT: without deformities or arthritic changes; no varicose veins/ venous insuffic/ or edema. NEURO:  CN's intact; motor testing normal; sensory testing normal; gait normal & balance OK. DERM:  No lesions noted; no rash etc...  RADIOLOGY DATA:  Reviewed in the EPIC EMR & discussed w/ the patient...  LABORATORY DATA:  Reviewed in the EPIC EMR & discussed w/ the  patient...    Assessment & Plan:    COPD w/ Hx RespFailure>> Asthma/ Asthmatic Bronchitis>  Hosp 1/13 w/ resp failure on vent x5d; event disch to rehab, then home w/ PT etc; PFTs confirm mod severe airflow obstruction & may have superimposed restrictive component; CXR w/o acute changes; REC> continue ADVAIR250Bid, SPIRIVA, Proventil prn==> improved back to baseline & Pred weaned off now ==> stable, continue Rx...  HBP>  Borderline ontrol on diet, no salt, & [name] CD $RemoveBefo'120mg'mHruhLcQGYZ$  Bid + LASIX20... Paroxysmal AFib>  Occurred during the 1/13 [facility] w/ resp failure, bronchodilators, etc;  Treated w/ [name] x2 in ER, then [name]==> now CARDIZEM CD $RemoveBef'120mg'TXJZDExNID$  Bid & stable BP, heart rate & rhythm... R CBruit> rec to take ASA81 & needs CDopplers...  Venous Insuffic>  She has mod VI changes and trace edema in ankles; discussed Rx w/ no salt, elevation, support hose... Add Lasix20...  CHOL>  FLP is improved on Atorva40 & rec to continue...  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  Hx Critical illness myopathy>  She required a rehab  stay in hosp; then disch w/ out pt phys therapy; now back to her baseline she says...  Anxiety>  She has rather moderate anxiety but refuses anxiolytic therapy...   Patient's Medications  New Prescriptions   No medications on file  Previous Medications   ASPIRIN EC 81 MG TABLET    Take 81 mg by mouth daily.  Modified Medications   Modified Medication Previous Medication   ALBUTEROL (VENTOLIN HFA) 108 (90 BASE) MCG/ACT INHALER VENTOLIN HFA 108 (90 BASE) MCG/ACT inhaler      INHALE 2 PUFFS INTO THE LUNGS EVERY 6 (SIX) HOURS AS NEEDED FOR WHEEZING.    INHALE 2 PUFFS INTO THE LUNGS EVERY 6 (SIX) HOURS AS NEEDED FOR WHEEZING.   ATORVASTATIN (LIPITOR) 40 MG TABLET atorvastatin (LIPITOR) 40 MG tablet      Take 1 tablet (40 mg total) by mouth daily.    Take 1 tablet (40 mg total) by mouth daily.   [name] (CARDIZEM CD) 120 MG 24  HR CAPSULE [name] (CARDIZEM CD) 120 MG 24 hr capsule      TAKE 1 CAPSULE BY MOUTH TWICE DAILY    TAKE 1 CAPSULE BY MOUTH TWICE DAILY   FAMOTIDINE (PEPCID) 20 MG TABLET famotidine (PEPCID) 20 MG tablet      Take 1 tablet (20 mg total) by mouth 2 (two) times daily.    Take 1 tablet (20 mg total) by mouth 2 (two) times daily.   FLUTICASONE-SALMETEROL (ADVAIR DISKUS) 250-50 MCG/DOSE AEPB ADVAIR DISKUS 250-50 MCG/DOSE AEPB      INHALE 1 PUFFS TWICE A DAY    INHALE 1 PUFFS TWICE A DAY   FUROSEMIDE (LASIX) 20 MG TABLET furosemide (LASIX) 20 MG tablet      TAKE 1 TABLET EVERY DAY    Take 1 tablet (20 mg total) by mouth daily.   POTASSIUM CHLORIDE (KLOR-CON M10) 10 MEQ TABLET KLOR-CON M10 10 MEQ tablet      TAKE 1 TABLET BY MOUTH TWICE A DAY    TAKE 1 TABLET BY MOUTH TWICE A DAY   TIOTROPIUM (SPIRIVA HANDIHALER) 18 MCG INHALATION CAPSULE tiotropium (SPIRIVA HANDIHALER) 18 MCG inhalation capsule      Place 1 capsule (18 mcg total) into inhaler and inhale daily.    Place 1 capsule (18 mcg total) into inhaler and inhale daily.  Discontinued Medications   ALBUTEROL (PROVENTIL HFA;VENTOLIN HFA) 108 (90 BASE) MCG/ACT INHALER    Inhale 2 puffs into the lungs every 6 (six) hours as needed. For shortness of breath

## 2013-10-30 ENCOUNTER — Other Ambulatory Visit: Payer: Self-pay | Admitting: Pulmonary Disease

## 2013-11-01 ENCOUNTER — Telehealth: Payer: Self-pay | Admitting: Pulmonary Disease

## 2013-11-01 NOTE — Telephone Encounter (Signed)
I spoke with the pt and she states Dr. Kriste BasqueNadel referred her to Dr. Margo AyeHall, dermatology, for a rash she had at last OV. She states she saw him and he did a biopsy. She states he advised her the rash was from her cholesterol medication and advised her to stop this medication. Pt wants to know what she should do since SN advised her she needs cholesterol medications. I have called Dr. Marcelo BaldyHalls office and requested OV note and biopsy results for SN to review. Pt aware we will call with SN recs. Will await fax to triage. Carron CurieJennifer Lawrence Mitch, CMA

## 2013-11-01 NOTE — Telephone Encounter (Signed)
Pt is aware of SN's recs. She will call us once the rash is resolved. Nothing further as needed.

## 2013-11-01 NOTE — Telephone Encounter (Signed)
Per SN---  Stop her atorvastatin 40 mg  For now and wait for rash to totally resolve  For now--stay on a low chol, low fat diet and work on weight reduction  Once the rash is totally gone, then call us for replacement med.   thanks

## 2013-11-01 NOTE — Telephone Encounter (Signed)
Records received and placed with print out of phone note. Please advise. Carron CurieJennifer Amica Harron, CMA

## 2013-12-02 ENCOUNTER — Telehealth: Payer: Self-pay | Admitting: Pulmonary Disease

## 2013-12-02 NOTE — Telephone Encounter (Signed)
Spoke with pt.  She states she saw her dermatologist and they are stopping Furosemide and Lipitor for 3 weeks to try to figure out what is causing rash.  She wants to make sure this is ok with Dr Kriste BasqueNadel.  Please advise. Also instructed pt she would need to find another primary care md since Dr Kriste BasqueNadel will only be seeing pulmonary pt's.  Pt given # for [facility].

## 2013-12-05 NOTE — Telephone Encounter (Signed)
ATC pt line keeps ringing and no VM and no answer wcb

## 2013-12-05 NOTE — Telephone Encounter (Signed)
Per SN---  Ok to stop but monitor BP at home and call our office if BP is >150/90.  thanks

## 2013-12-06 NOTE — Telephone Encounter (Signed)
ATC, NA, no voicemail. Rachid Parham, CMA  

## 2013-12-07 NOTE — Telephone Encounter (Signed)
Called home #. Line rang several times. No answer and no VM Called mobile #- LMTCB x1

## 2013-12-08 NOTE — Telephone Encounter (Signed)
ATC home #-line rang several times and no answer and no VM Called mobile #- LMTCB x2

## 2013-12-09 NOTE — Telephone Encounter (Signed)
Called spoke with pt. Aware of recs.  Nothing further needed 

## 2014-02-15 ENCOUNTER — Other Ambulatory Visit: Payer: Self-pay | Admitting: Family Medicine

## 2014-02-15 ENCOUNTER — Other Ambulatory Visit (HOSPITAL_COMMUNITY)
Admission: RE | Admit: 2014-02-15 | Discharge: 2014-02-15 | Disposition: A | Discharge status: Home / Self Care | Payer: Medicare Other | Source: Ambulatory Visit | Attending: Family Medicine | Admitting: Family Medicine

## 2014-02-15 DIAGNOSIS — Z1151 Encounter for screening for human papillomavirus (HPV): Secondary | ICD-10-CM | POA: Insufficient documentation

## 2014-02-15 DIAGNOSIS — Z124 Encounter for screening for malignant neoplasm of cervix: Secondary | ICD-10-CM | POA: Insufficient documentation

## 2014-02-16 LAB — CYTOLOGY - PAP

## 2014-02-28 ENCOUNTER — Other Ambulatory Visit: Payer: Self-pay | Admitting: Pulmonary Disease

## 2014-03-15 ENCOUNTER — Ambulatory Visit: Payer: Medicare Other | Admitting: Pulmonary Disease

## 2014-03-22 ENCOUNTER — Other Ambulatory Visit: Payer: Self-pay

## 2014-03-22 DIAGNOSIS — Z1231 Encounter for screening mammogram for malignant neoplasm of breast: Secondary | ICD-10-CM

## 2014-03-29 ENCOUNTER — Ambulatory Visit
Admission: RE | Admit: 2014-03-29 | Discharge: 2014-03-29 | Disposition: A | Discharge status: Home / Self Care | Payer: Medicare Other | Source: Ambulatory Visit

## 2014-03-29 DIAGNOSIS — Z1231 Encounter for screening mammogram for malignant neoplasm of breast: Secondary | ICD-10-CM

## 2014-03-30 ENCOUNTER — Other Ambulatory Visit: Payer: Self-pay | Admitting: Family Medicine

## 2014-03-30 DIAGNOSIS — R928 Other abnormal and inconclusive findings on diagnostic imaging of breast: Secondary | ICD-10-CM

## 2014-04-11 ENCOUNTER — Ambulatory Visit
Admission: RE | Admit: 2014-04-11 | Discharge: 2014-04-11 | Disposition: A | Discharge status: Home / Self Care | Payer: Medicare Other | Source: Ambulatory Visit | Attending: Family Medicine | Admitting: Family Medicine

## 2014-04-11 DIAGNOSIS — R928 Other abnormal and inconclusive findings on diagnostic imaging of breast: Secondary | ICD-10-CM

## 2014-04-21 ENCOUNTER — Other Ambulatory Visit: Payer: Self-pay | Admitting: Pulmonary Disease

## 2015-06-12 ENCOUNTER — Other Ambulatory Visit: Payer: Self-pay

## 2015-06-12 DIAGNOSIS — Z1231 Encounter for screening mammogram for malignant neoplasm of breast: Secondary | ICD-10-CM

## 2015-06-20 ENCOUNTER — Ambulatory Visit
Admission: RE | Admit: 2015-06-20 | Discharge: 2015-06-20 | Disposition: A | Discharge status: Home / Self Care | Payer: Medicare Other | Source: Ambulatory Visit

## 2015-06-20 DIAGNOSIS — Z1231 Encounter for screening mammogram for malignant neoplasm of breast: Secondary | ICD-10-CM

## 2015-08-01 ENCOUNTER — Encounter: Payer: Self-pay | Admitting: Internal Medicine

## 2018-10-23 ENCOUNTER — Encounter: Payer: Self-pay | Admitting: Gastroenterology
